# Patient Record
Sex: Male | Born: 1955
Health system: Southern US, Community
[De-identification: ages and names within clinical notes are randomized; demographics above are authoritative.]

## PROBLEM LIST (undated history)

## (undated) DIAGNOSIS — F329 Major depressive disorder, single episode, unspecified: Secondary | ICD-10-CM

## (undated) DIAGNOSIS — F1910 Other psychoactive substance abuse, uncomplicated: Secondary | ICD-10-CM

## (undated) DIAGNOSIS — I10 Essential (primary) hypertension: Secondary | ICD-10-CM

## (undated) DIAGNOSIS — M62830 Muscle spasm of back: Secondary | ICD-10-CM

## (undated) DIAGNOSIS — F1020 Alcohol dependence, uncomplicated: Secondary | ICD-10-CM

## (undated) DIAGNOSIS — Z86010 Personal history of colonic polyps: Secondary | ICD-10-CM

## (undated) DIAGNOSIS — D649 Anemia, unspecified: Secondary | ICD-10-CM

## (undated) DIAGNOSIS — C801 Malignant (primary) neoplasm, unspecified: Secondary | ICD-10-CM

## (undated) DIAGNOSIS — F3181 Bipolar II disorder: Secondary | ICD-10-CM

## (undated) DIAGNOSIS — E785 Hyperlipidemia, unspecified: Secondary | ICD-10-CM

## (undated) DIAGNOSIS — L719 Rosacea, unspecified: Secondary | ICD-10-CM

## (undated) DIAGNOSIS — F32A Depression, unspecified: Secondary | ICD-10-CM

## (undated) HISTORY — DX: Other psychoactive substance abuse, uncomplicated: F19.10

## (undated) HISTORY — PX: BASAL CELL CARCINOMA EXCISION: SHX1214

## (undated) HISTORY — DX: Major depressive disorder, single episode, unspecified: F32.9

## (undated) HISTORY — DX: Bipolar II disorder: F31.81

## (undated) HISTORY — PX: TONSILLECTOMY: SUR1361

## (undated) HISTORY — DX: Essential (primary) hypertension: I10

## (undated) HISTORY — PX: POLYPECTOMY: SHX149

## (undated) HISTORY — DX: Anemia, unspecified: D64.9

## (undated) HISTORY — DX: Rosacea, unspecified: L71.9

## (undated) HISTORY — DX: Malignant (primary) neoplasm, unspecified: C80.1

## (undated) HISTORY — DX: Personal history of colonic polyps: Z86.010

## (undated) HISTORY — DX: Depression, unspecified: F32.A

## (undated) HISTORY — PX: COLONOSCOPY W/ BIOPSIES: SHX1374

## (undated) HISTORY — DX: Alcohol dependence, uncomplicated: F10.20

## (undated) HISTORY — DX: Muscle spasm of back: M62.830

## (undated) HISTORY — DX: Hyperlipidemia, unspecified: E78.5

---

## 1993-09-03 HISTORY — PX: ACHILLES TENDON REPAIR: SUR1153

## 1998-02-23 ENCOUNTER — Other Ambulatory Visit: Admission: RE | Admit: 1998-02-23 | Discharge: 1998-02-23 | Payer: Self-pay | Admitting: Obstetrics and Gynecology

## 1999-09-04 HISTORY — PX: SHOULDER SURGERY: SHX246

## 2000-02-08 ENCOUNTER — Inpatient Hospital Stay (HOSPITAL_COMMUNITY): Admission: EM | Admit: 2000-02-08 | Discharge: 2000-02-11 | Payer: Self-pay | Admitting: Emergency Medicine

## 2000-02-08 ENCOUNTER — Encounter: Payer: Self-pay | Admitting: Orthopedic Surgery

## 2000-02-09 ENCOUNTER — Encounter: Payer: Self-pay | Admitting: Orthopedic Surgery

## 2000-09-27 ENCOUNTER — Observation Stay (HOSPITAL_COMMUNITY): Admission: RE | Admit: 2000-09-27 | Discharge: 2000-09-28 | Payer: Self-pay | Admitting: Specialist

## 2000-09-27 ENCOUNTER — Encounter: Payer: Self-pay | Admitting: Specialist

## 2002-06-03 ENCOUNTER — Encounter (INDEPENDENT_AMBULATORY_CARE_PROVIDER_SITE_OTHER): Payer: Self-pay | Admitting: Specialist

## 2002-06-03 ENCOUNTER — Ambulatory Visit (HOSPITAL_COMMUNITY): Admission: RE | Admit: 2002-06-03 | Discharge: 2002-06-03 | Payer: Self-pay | Admitting: *Deleted

## 2002-07-15 ENCOUNTER — Inpatient Hospital Stay (HOSPITAL_COMMUNITY): Admission: EM | Admit: 2002-07-15 | Discharge: 2002-07-19 | Payer: Self-pay | Admitting: Psychiatry

## 2003-04-05 ENCOUNTER — Emergency Department (HOSPITAL_COMMUNITY): Admission: EM | Admit: 2003-04-05 | Discharge: 2003-04-05 | Payer: Self-pay | Admitting: Emergency Medicine

## 2004-09-03 HISTORY — PX: MOHS SURGERY: SUR867

## 2004-11-20 ENCOUNTER — Ambulatory Visit: Payer: Self-pay | Admitting: Psychiatry

## 2004-11-20 ENCOUNTER — Other Ambulatory Visit (HOSPITAL_COMMUNITY): Admission: RE | Admit: 2004-11-20 | Discharge: 2004-12-21 | Payer: Self-pay | Admitting: Psychiatry

## 2008-03-06 ENCOUNTER — Inpatient Hospital Stay (HOSPITAL_COMMUNITY): Admission: EM | Admit: 2008-03-06 | Discharge: 2008-03-08 | Payer: Self-pay | Admitting: Emergency Medicine

## 2010-11-23 DIAGNOSIS — Z860100 Personal history of colon polyps, unspecified: Secondary | ICD-10-CM

## 2010-11-23 HISTORY — DX: Personal history of colon polyps, unspecified: Z86.0100

## 2010-11-23 HISTORY — DX: Personal history of colonic polyps: Z86.010

## 2011-01-16 NOTE — H&P (Signed)
NAME:  [name], [name] NO.:  [id number]   MEDICAL RECORD NO.:  [id number]          PATIENT TYPE:  EMS   LOCATION:  ED                           FACILITY:  [facility]   PHYSICIAN:  Dahlia Bailiff, MD    DATE OF BIRTH:  03-11-1956   DATE OF ADMISSION:  03/06/2008  DATE OF DISCHARGE:                              HISTORY & PHYSICAL   REASON FOR ADMISSION:  Acute low back pain.   HISTORY:  This is a 55 year old gentleman who is well known to Dr.  Tonita Cong.  He has had chronic low back pain for which Dr. Tonita Cong has treated  him nonoperatively.  The patient states that about 2 weeks ago, he had  another episode of acute onset of severe back pain with minimal to no  significant trauma.  He states that he only could get around by crawling  on his hands and knees.  He eventually got over this.  He was walking  down a flight of stairs last night early into this morning and just  stepped wrong and had a severe onset of sharp stabbing low back pain.  There is no migration of the pain.  It is in the lower extremities.  No  motor or sensory deficits.  He states the pain is quite severe and he is  unable to tolerate it.  As a result, he notified me since I was on call.  I suggested he present to the emergency room which he did.  At this  point in time, he states that he cannot get comfortable in bed, he has  severe low back pain.  No migration into the lower extremities.  He  states since he lives alone, he does not feel safe going home.   PAST MEDICAL HISTORY:  Chronic back pain.   SOCIAL HISTORY:  Nonsmoker.   ALLERGIES:  NO KNOWN DRUG ALLERGIES.   CURRENT MEDICATIONS:  1. Lamictal.  2. Minocycline.  3. Prednisone.  4. Seroquel.   REVIEW OF SYSTEMS:  A 14-point review of systems, no significant  abnormalities.  No history of lung, cardiac or GI issues.   PHYSICAL EXAMINATION:  GENERAL:  He is alert and oriented x3.  He has  difficulty just moving very slight distances in  bed.  He is  neurologically intact.  EXTREMITIES:  No focal motor or sensory deficits in the lower extremity.  Negative nerve root tension sign.  Symmetrical deep tendon reflexes.  Negative clonus.  Intact peripheral pulses.  BACK:  Horrific low back pain with even minimal palpation.  Positive  reproduction of pain with axial pressure applied to the head.  CHEST:  He has got no shortness of breath or chest pain.  ABDOMEN:  Soft and nontender.   At this point in time, the patient states that he is unable to tolerate  the pain.  We will admit him for social reasons and pain control.  I  will admit him for my partner, Dr. Tonita Cong who has been caring cor the  patient on an outpatient basis.  Will control pain with oral medications  and muscle  relaxants.  Re-evaluate in the morning for possible discharge  to home.      Dahlia Bailiff, MD  Electronically Signed     DDB/MEDQ  D:  03/06/2008  T:  03/06/2008  Job:  [id number]   cc:   Dahlia Bailiff, MD  Fax: [phone]

## 2011-01-19 NOTE — H&P (Signed)
NAME:  [name], FOILES NO.:  [id number]   MEDICAL RECORD NO.:  [id number]                   PATIENT TYPE:  IPS   LOCATION:  5409                                 [facility]ACILITY[facility]  [facility]   PHYSICIAN:  Aldona Bar, M.D.               DATE OF BIRTH:  [birth year]   DATE OF ADMISSION:  07/15/2002  DATE OF DISCHARGE:                         PSYCHIATRIC ADMISSION ASSESSMENT   IDENTIFYING INFORMATION:  This is a 55 year old married white male  voluntarily admitted on July 15, 2002.   HISTORY OF PRESENT ILLNESS:  The patient presents with a history of alcohol  abuse, drinking about 18 drinks per day.  His drinking has been escalating  over the past few days.  Prior to this admission, he has been having  significant stressors.  His sister-in-law has passed away.  He has a history  of depression with fleeting suicidal thoughts to jump off a building.  His  sleep has been satisfactory.  His appetite has been satisfactory.  He denies  any psychotic symptoms.  Denies any withdrawal symptoms and states he is  motivated to stop drinking.   PAST PSYCHIATRIC HISTORY:  First hospitalization to [facility].  Was at [facility] in February of 2003 for alcohol abuse.  Sees  a therapist, Juliann Pulse __________, at [facility] and sees Dr. [name].  No  history of a suicide attempt.   SOCIAL HISTORY:  This is a 55 year old married white male, married for eight  years, first marriage, no children.  He used to work for college sports.  He  is not working at this time.  No legal problems.  He states he has a  supportive wife.   FAMILY HISTORY:  None.   ALCOHOL/DRUG HISTORY:  He denies any substance abuse.  Drinking has been  escalating over this past weekend.  He states his last drink was on Tuesday.  He has a history of blackouts with no seizures.  He does state that he does  not drink in the morning or at work.  He has been attending [place] meetings.   PRIMARY  CARE PHYSICIAN:  Unknown.   MEDICAL PROBLEMS:  Herniated disk.  States no chronic illnesses.   MEDICATIONS:  Has been on Lexapro since March of 2003 for depression.  Has  been on Effexor and Wellbutrin in the past and minocycline for his rash.   ALLERGIES:  No known allergies.   PHYSICAL EXAMINATION:  VITAL SIGNS:  Temperature 98.4, heart rate 94,  respirations 22, blood pressure 112/70.  He is 6 feet 2 inches tall.  He is  210 pounds.  GENERAL APPEARANCE:  The patient is a 55 year old Caucasian male, average  stature, appears his stated age.  Well-built, well-nourished.  He is well-  groomed, alert and cooperative.  HEENT:  Head is normocephalic, atraumatic.  He can raise his eyebrows.  His  hair is neat and evenly distributed.  EOMs are  intact.  Funduscopic exam is  within normal limits.  External ear canals are patent.  There is some  cerumen noted.  Nostrils are patent bilaterally.  No sinus tenderness.  Mucosa is moist with good dentition.  No lesions were seen.  His tongue  protrudes midline without tremor.  He can clench his teeth and puff out his  cheeks.  Uvula is midline.  No pharyngeal exudate.  NECK:  Supple with full range of motion.  Negative lymphadenopathy.  Trachea  is midline.  Thyroid is nonpalpable, nontender.  CHEST:  Clear to auscultation.  No adventitious sounds.  Thorax is  symmetrical with good expansion.  HEART:  Regular rate and rhythm without murmurs, gallops or rubs.  Carotid  pulses are equal and adequate.  No carotid bruits were auscultated.  No  edema was noted.  ABDOMEN:  Soft, nontender abdomen.  Active bowel sounds are present.  No CVA  tenderness.  MUSCULOSKELETAL:  No joint swelling or deformity.  Muscle strength and tone  is equal bilaterally.  No signs of injury.  The patient has a past history  of right AC joint surgery.  SKIN:  Warm and dry with good turgor.  Nail beds are pink with good  capillary refill.  The patient has acne vulgaris rash  noted.  NEUROLOGIC:  Oriented x 3.  Cranial nerves are grossly intact.  Deep tendon  reflexes are 2+ equal and adequate.  Good grip strength bilaterally.  No  involuntary movements.  Cerebellar function is intact.   LABORATORY DATA:  Glucose is elevated at 159, bilirubin mildly elevated at  1.3.   MENTAL STATUS EXAM:  He is an alert, oriented, nicely dressed, healthy male.  Cooperative with good eye contact.  Speech is expansive.  Mood is  appropriate.  Thought processes are coherent.  There is no evidence of  psychosis.  He does not appear to be responding to internal stimuli.  Cognitive function intact.  Memory is good.  Judgment is fair.  Insight is  poor.   DIAGNOSES:   AXIS I:  1. Depression not otherwise specified.  2. Alcohol dependence.   AXIS II:  Deferred.   AXIS III:  Herniated disk.   AXIS IV:  Problems with primary support group and other psychosocial  problems.   AXIS V:  Current 40; estimated this past year 64.   PLAN:  Voluntary admission for alcohol dependence and depression.  Contract  for safety.  Check every 15 minutes.  Will initiate the low dose Librium  protocol to detox safely.  Will resume his Lexapro.  Will encourage fluids.  Will increase patient's coping skills by attending group therapy and  continue with his outpatient therapy.  The patient to remain alcohol-free  and attend AA meetings.   TENTATIVE LENGTH OF STAY:  Three to four days.     Redgie Grayer, N.P.                       Aldona Bar, M.D.    JO/MEDQ  D:  07/17/2002  T:  07/18/2002  Job:  [id number]

## 2011-01-19 NOTE — Op Note (Signed)
[facility]  Patient:    [name], [name]                     MRN: [id number] Proc. Date: 09/27/00 Adm. Date:  [id number] Disc. Date: [id number] Attending:  Cynda Familia                           Operative Report  PREOPERATIVE DIAGNOSIS:  Right shoulder type 5 acromioclavicular joint injury.  POSTOPERATIVE DIAGNOSIS:  Right shoulder type 5 acromioclavicular joint injury.  PROCEDURE:  Right shoulder AC joint reduction and repair with internal fixation.  SURGEON:  R. Hart Robinsons, M.D.  ASSISTANT:  Peter Congo, P.A.-C.  ANESTHESIA:  General.  ESTIMATED BLOOD LOSS:  Less than 50 cc.  DRAINS:  None.  COMPLICATIONS:  None.  DISPOSITION:  To PACU stable.  OPERATIVE DETAILS:  The patient was counseled in the holding area and correct side was identified.  IV started and antibiotics were given.  Interscalene block was attempted by Dr. Thereasa Parkin, but unsuccessful.  He was taken to the operating room, placed in a modified beach chair position.  The right shoulder examined.  Some degree of stiffness already developing, but a range of motion was full.  He had a marked AC joint deformity consistent with a type 5 injury.  Prepped with Duraprep and the arm draped in sterile fashion. Overlying skin incision was outlined with a marking pen and anesthetized with 0.5% Marcaine with epinephrine.  A longitudinal incision was made between the lateral third of the clavicle to the corner of the [facility] joint.  Dissected carried through the skin and subcutaneous tissue.  The deltoid was found to be partially avulsed.  A large hematoma was encountered.  The deltotrapezius fascia was also traumatically torn. At this time the skin flaps were developed and the interval over the [facility] joint with the deltoid was then mobilized, leaving a cuff of tissue for later repair.  The CA ligament was identified, dissected and then released off the acromion for transfer.   The coracoclavicular ligaments, conoid and trapezoid ligaments were found, and actually they had a nice stump of tissue on the coracoid and undersurface of the acromion.  A fiber wire suture was placed through this for repair.  The joint was inspected.  The fibrocartilage had been torn and it was debrided. The wound was irrigated and hemostasis was obtained.  On looking at the superior surface of the rotator cuff, I could see no gross abnormality, but the view was limited secondary to the visualization.  The wounds were irrigated.  At this point and time I reduced the [facility] joint and placed a guide pin across the acromion for a 4.5 mm AO cannulated screw.  Using a C-arm, confirmed satisfactory reduction and placement of the pin; then a screw was placed.  A plain x-ray confirmed excellent reduction of the [facility] joint and placement of implants. The fiber wire was then utilized and sutured down the coracoclavicular ligaments.  Then circumferential suture was then placed to the stump of the ligaments and around the clavicle for later repair with fiber wire.  The coracoclavicular ligament was then transferred to the distal edge of the clavicle, through the drill holes in the clavicle; suturing this to the undersurface.  At this point and time a meticulous closure was done of the deltoid and back to the leading edge of the acromion, and also to the clavicle where it had been  avulsed; sutured to the periosteum in the fascia and the deltotrapezial fascia was also closed with Vicryl suture.  This gave a nice tidy repair.  The wound was irrigated.  ____________ closure.  Subcutaneous closed with Vicryl, skin closed with subcuticular Monocryl suture.  Another 15 cc of 0.5% Marcaine with epinephrine was placed in the wound edges.  Benzoin with Steri-Strips were applied.  Sterile dressing was applied.  He tolerated the procedure well and there were no complications.  Sponge and needle counts were correct.   He was awakened and extubated, taken from the operating room to PACU in stable condition in a shoulder immobilizer. DD:  09/27/00 TD:  09/28/00 Job: [id number]

## 2011-01-19 NOTE — Discharge Summary (Signed)
[facility]  Patient:    [name], [name]                     MRN: [id number] Adm. Date:  [id number] Disc. Date: [id number] Attending:  Laddie Aquas Dictator:   [employer], P.A.                           Discharge Summary  ADMITTING DIAGNOSIS:  Severe lumbosacral strain.  DISCHARGE DIAGNOSIS:  Severe lumbosacral strain, improving.  PROCEDURES:  None.  CONSULTS:  None.  BRIEF HISTORY:  This is a 55 year old, well-known to Dr. Collie Siad.  He had acute onset of low back pain and bilateral leg pain.  He has had two previous admissions to [facility] for the same problem during the past four years.  To date, x-rays and MRIs have been negative.  However, patient has improved on both episodes with conservative treatment.  He will be admitted to [facility] at this time for MRI, bed rest and traction and analgesics.  HOSPITAL COURSE:  On February 08, 2000, the patient was admitted to [facility].  He was placed in traction and put on strict bed rest.  Prednisone dose pack was initiated, as well as Demerol.  MRI revealed a herniated nucleus pulposus at L1-L2 on the central and right side, herniated nucleus pulposus at L3-L4 on the central and left side and herniated nucleus pulposus at L4-L5 central.  On hospital day 3, the patient was feeling "a lot better".  On exam, he was afebrile and his vital signs were stable.  He was neurovascularly intact to bilateral lower extremities.  He was moving around the bed quite easily.  His abdomen was soft and nontender.  It was felt the patient was stable for discharge to home at this time after evaluation with physical therapy for ambulation and transfers.  LABS: 1.   CBC was within normal limits. 2.   Routine chemistry was within normal limits.  RADIOLOGY: 1.   X-rays of the lumbar spine showed degenerative disc disease at L4-L5.      MRI of the spine again showed moderate-sized disc  extrusion L1-L2 central      to the right with right-sided L2 nerve root encroachment. 2.   Subligamentus protrusion at L3-L4 central to the left with L4 nerve      root encroachment. 3.   Central subligamentus protrusion L4-L5 with neural encroachment. 4.   Lumbar facet arthropathy at L5-S1.  CONDITION ON DISCHARGE:  Improved and stable.  DISCHARGE PLANS:  Patient will be discharged to home.  He is to be up ad lib, weight-bearing as tolerated.  He is not to do any bending  heavy lifting.  He is to continue taking his Medrol dose pack as directed.  He was given [employer] #50 for pain.  Valium 5 mg #30 for spasms.  Following his Medrol dose pack, he is to continue taking Vioxx 25 mg daily.  He is to resume home diet and home medications.  The patient understands that at this time, we are going to treat his condition conservatively.  He is to return to clinic in one week to follow up with Dr. Collie Siad.  He is to call [phone] to make an appointment. DD:  03/08/00 TD:  03/08/00 Job: [id number]

## 2011-01-19 NOTE — H&P (Signed)
[facility]  Patient:    [name], [name]                     MRN: [id number] Adm. Date:  [id number] Attending:  Cynda Familia Dictator:   Peter Congo, P.A. CC:         Zella Richer. Burnett Harry, M.D.   History and Physical  CHIEF COMPLAINT:  Right shoulder pain.  HISTORY OF PRESENT ILLNESS:  [name] is a pleasant 55 year old male with a history of chronic shoulder problems.  Approximately one and a half to two weeks ago, he did fall while skiing.  He immediately had an x-ray in the emergency department in [place], [place], which revealed a right [facility] joint separation.  He was seen by R. Hart Robinsons, M.D., in the office, which confirmed an [facility] joint separation on x-ray and showed that he had an obvious deformity.  At that point, he did have a type 5 AC joint separation and felt a necessity for him to undergo repair on his [facility] joint.  PAST MEDICAL HISTORY:  Significant for right AC joint separation as described above.  Also, history of HNP at L3 through L5.  No other medical problems.  PAST SURGICAL HISTORY:  Achilles tendon repair in 1994.  Wisdom teeth, unsure of the year of this.  A tonsillectomy age 61 years old.  SOCIAL HISTORY:  The patient is married.  He lives at home with his family. He uses alcohol rarely and is a nonsmoker.  ALLERGIES:  No known drug allergies.  MEDICATIONS:  Minocycline p.r.n. acne and ibuprofen and Percocet p.r.n. pain.  FAMILY HISTORY:  Noncontributory.  REVIEW OF SYSTEMS:  General:  No fevers, chills, night sweats, or bleeding tendencies.  CNS:  No blurred or double vision.  No seizures, headache, or paralysis.  Respiratory:  No shortness of breath, productive cough, or hemoptysis.  Cardiovascular:  No chest pain, angina, or orthopnea.  GI:  No nausea, vomiting, diarrhea, constipation, or melena.  GU:  No dysuria, hematuria, or discharge.  Musculoskeletal:  Right AC joint as described above. No history of  arthritis or joint erythema.  PHYSICAL EXAMINATION:  GENERAL APPEARANCE:  A pleasant, white, 55 year old male.  Excellent historian.  VITAL SIGNS:  Temperature 97.7 degrees, pulse 69, respirations 20, blood pressure 144/80.  NECK:  Supple.  Negative for carotid bruits.  LUNGS:  Clear to auscultation bilaterally.  No rhonchi, rales, or wheezes.  BREASTS:  Not pertinent to present illness.  GENITOURINARY:  Not pertinent to present illness.  HEART:  S1 and S2.  No murmurs, rubs, or gallops.  The heart is regular in rate and rhythm.  ABDOMEN:  Soft and nontender.  Positive bowel sounds.  EXTREMITIES:  2+ radial pulse bilaterally.  The patient has decreased movement to the right upper extremity.  There is an obvious deformity about the clavicular and acromial area.  Pain on palpation of the [facility] joint.  SKIN:  Clear.  The patient does have some mild acne about the right shoulder and on the face.  NEUROLOGIC:  EOMs intact.  PERRLA.  LABORATORY DATA AND X-RAYS:  X-rays on September 25, 2000, revealed a right [facility] joint separation, type 5.  IMPRESSION: 1. Right shoulder acromioclavicular joint separation, type 5. 2. History of herniated nucleus pulposus at L3 through L5.  PLAN:  The patient is scheduled for a right AC joint repair on September 27, 2000, by R. Hart Robinsons, M.D., at [facility]. DD:  09/27/00  TD:  09/27/00 Job: [id number]

## 2011-01-19 NOTE — Discharge Summary (Signed)
NAME:  [name], [name] NO.:  [id number]   MEDICAL RECORD NO.:  [id number]                   PATIENT TYPE:  IPS   LOCATION:  7253                                 [facility]ACILITY:  [facility]   PHYSICIAN:  Aldona Bar, M.D.               DATE OF BIRTH:  [birth year]   DATE OF ADMISSION:  07/15/2002  DATE OF DISCHARGE:  07/19/2002                                 DISCHARGE SUMMARY   PATIENT IDENTIFICATION:  The patient is a 55 year old married white male who  was admitted on involuntary papers.  He presented with a history of alcohol  abuse, drinking about 18 drinks per day and reporting that his drinking had  been escalating for the past few weeks.  Prior to admission, he had been  having significant stressors.  His sister-in-law had passed away.  The  patient had a history of depression with fleeting suicidal thoughts,  thoughts of jumping off a building.  He never attempted suicide.  The  patient was under the care of a psychiatrist, Denna Haggard, M.D.  In  February 2003, he was at [facility] for alcohol detoxification.  Since  March 2003, he had been treated with Lexapro for depression.  Medically, he  suffers from herniated disk; otherwise no medical problems.   PHYSICAL EXAMINATION ON ADMISSION:  GENERAL: Physical examination was  essentially normal.  VITAL SIGNS: Blood pressure 112/70, respiratory rate  22, heart rate 94, temperature 98.4.  The patient did not seem to be in any  physical distress.   DIAGNOSTIC IMPRESSION:   AXIS I:  1. Depressive disorder, not otherwise specified.  2. Alcohol dependence.   HOSPITAL COURSE:  After admission to the ward, the patient was placed on  Librium detoxification protocol.  He was started on Lexapro 20 mg daily and  trazodone 50 mg at bedtime p.r.n. insomnia.   On November 14, he reported doing better.  He denied any dangerous  ideations, no signs of withdrawal.  He had presented with a lot of denial  about his relapse and playing down the seriousness of his addiction.  On  November 15, he kept continuously improving; no suicidal ideation.  On  November 16, there were no signs of withdrawal.  There were isolated  laboratory abnormalities with slight elevation of bilirubin and elevation of  nonfasting blood sugar.  Affect was normal.  There were no signs of  withdrawal.   LABORATORY DATA:  Review of laboratory work showed normal CBC with slight  elevation of white blood count 11.5.  Elevated nonfasting blood sugar 159.  Isolated increase of total bilirubin at 1.3 with normal up to 1.2.  Slight  elevation of T3 uptake 39.2 with normal up to 37.  TSH and T4 were normal.  Urine drug screen was positive for benzodiazepines; otherwise, negative for  substances of abuse.  Urinalysis was normal.   PHYSICAL EXAMINATION:  VITAL SIGNS: Vital signs  throughout the  hospitalization were stable with blood pressure 120/68, normal temperature,  respiratory rate, and pulse.  The patient was 6 feet 2 inches tall and his  weight was 200 pounds.   It was felt that the patient had accomplished detoxification.  Since he was  agreeable for outpatient treatment, I felt it would be safe to discharge him  home.    DISCHARGE DIAGNOSES:   AXIS I:  1. Depressive disorder, not otherwise specified.  2. Alcohol dependence.   AXIS II:  Diagnosis deferred.   AXIS III:  Herniated disk.   AXIS IV:  Problems with primary support group.   AXIS V:  Global assessment of functioning upon admission was 15, for past  year maximum 70, upon discharge 60-65.   DISCHARGE MEDICATIONS:  Lexapro 20 mg daily and he had a supply of this  medication.   DISCHARGE INSTRUCTIONS:  He was supposed to attend AA groups upon discharge  and attend [facility] IOP at 5:30 p.m. on July 20, 2002.  Prior to discharge,  the patient was informed about abnormalities of his blood work and advised  to see his physician for followup.  Likely,  most of the abnormalities were  related to alcohol use.  The patient understood instructions and in good  condition was discharged home in the care of his family.                                               Aldona Bar, M.D.    JS/MEDQ  D:  08/18/2002  T:  08/19/2002  Job:  [id number]

## 2011-02-12 ENCOUNTER — Telehealth: Payer: Self-pay | Admitting: Internal Medicine

## 2011-02-12 NOTE — Telephone Encounter (Signed)
Talked with the patient and he stated he was a patient of Dr. Kathline Magic at [facility] but his insurance is changing and [facility] doesn't accept his insurance. Patient stated he would like to become a patient of your if you will accept him. He is having his records sent for your review.

## 2011-02-14 NOTE — Telephone Encounter (Signed)
[place] - waiting on records

## 2011-02-15 ENCOUNTER — Telehealth: Payer: Self-pay | Admitting: Internal Medicine

## 2011-02-15 NOTE — Telephone Encounter (Signed)
Records received and put on (your) Dr. Celesta Aver desk for review.

## 2011-02-15 NOTE — Telephone Encounter (Signed)
Forwarded to Dr. Carlean Purl for review.

## 2011-02-21 NOTE — Telephone Encounter (Signed)
Information given to scheduler to make appointment.

## 2011-02-21 NOTE — Telephone Encounter (Signed)
We can schedule him - next available

## 2011-02-27 ENCOUNTER — Encounter: Payer: Self-pay | Admitting: Internal Medicine

## 2011-03-09 ENCOUNTER — Telehealth: Payer: Self-pay | Admitting: Internal Medicine

## 2011-03-09 NOTE — Telephone Encounter (Signed)
Patient has no current problems.  He just wanted to get his records over here and accepted in case a GI is needed.  He will call back to schedule an office visit when he is ready. He has asked me to cancel the office visit.

## 2011-04-03 ENCOUNTER — Telehealth: Payer: Self-pay | Admitting: Internal Medicine

## 2011-04-03 NOTE — Telephone Encounter (Signed)
Forwarded to Dr. Carlean Purl For Review.

## 2011-04-11 ENCOUNTER — Ambulatory Visit: Payer: Self-pay | Admitting: Internal Medicine

## 2011-04-26 ENCOUNTER — Ambulatory Visit (INDEPENDENT_AMBULATORY_CARE_PROVIDER_SITE_OTHER): Payer: Managed Care, Other (non HMO) | Admitting: Internal Medicine

## 2011-04-26 ENCOUNTER — Encounter: Payer: Self-pay | Admitting: Internal Medicine

## 2011-04-26 VITALS — BP 122/78 | HR 78 | Ht 74.0 in | Wt 221.0 lb

## 2011-04-26 DIAGNOSIS — F1020 Alcohol dependence, uncomplicated: Secondary | ICD-10-CM

## 2011-04-26 DIAGNOSIS — K5100 Ulcerative (chronic) pancolitis without complications: Secondary | ICD-10-CM | POA: Insufficient documentation

## 2011-04-26 DIAGNOSIS — F3189 Other bipolar disorder: Secondary | ICD-10-CM

## 2011-04-26 DIAGNOSIS — Z86010 Personal history of colonic polyps: Secondary | ICD-10-CM

## 2011-04-26 DIAGNOSIS — F3181 Bipolar II disorder: Secondary | ICD-10-CM

## 2011-04-26 MED ORDER — MESALAMINE 1.2 G PO TBEC: DELAYED_RELEASE_TABLET | ORAL | Status: DC

## 2011-04-26 NOTE — Assessment & Plan Note (Signed)
Given this adenomatous polyp in the setting of ulcerative colitis repeat colonoscopy in the year, March 2013. This could be indicative of a dysplasia as opposed to an isolated adenoma the latter is more likely like to be more certain.

## 2011-04-26 NOTE — Progress Notes (Signed)
  Subjective:    Patient ID: [name], male    DOB: 04-15-1956, 55 y.o.   MRN: [id number]  HPI 55 year old white man diagnosed with ulcerative colitis by Dr. Michail Sermon. That was in 2003, and he had proctitis. Eventually had progressive disease and came to have universal ulcerative colitis. Over time he has done pretty well, he has had some problems when he has not taken his Lialda properly but lately he feels like he has been taking that at 2.4 g daily and doing well. He had some inflammatory changes in his recent colonoscopy in 2011 as well as a small adenoma removed then. Since that time, he has been compliant with his medication as described. Moving to state high-risk health plan so he needed to obtain a new physician covered under this plan. His GI review of systems is otherwise negative. He has no diarrhea or bleeding lately. No abdominal pain.  He is a divorced white man, works as a Forensic scientist. No children. Believe he lives alone. He is a recovering alcoholic and also has bipolar disorder. He does admit that he has had trouble with relapses over time, estimating he is about 98% sober. Review of Systems Recent skin cancer removals.    Objective:   Physical Exam General: Well-developed, well-nourished and in no acute distress Vitals: Reviewed and listed above Eyes:anicteric. Mouth and posterior pharynx: normal.  Neck: supple w/o thyromegaly or mass.  Lungs: clear. Heart: S1S2, no rubs, murmurs, gallops. Abdomen: soft, non-tender, no hepatosplenomegaly, hernia, or mass and BS+.  Rectal: inspected - small tag otherwise no perianal changes Lymphatics: no cervical, [place] or inguinal nodes. Extremities:  no edema Skin multiple ulcers from lesion treatment/removal on forearms Neuro: nonfocal. A&O x 3.  Psych: appropriate mood and  affect.           Assessment & Plan:

## 2011-04-26 NOTE — Assessment & Plan Note (Signed)
Continue current therapy with Lialda 2.4 g daily  Repeat routine colonoscopy (WITH PROPOFOL) for high-risk screening in 11/2010 or close to that given adenoma in setting of the UC

## 2011-04-26 NOTE — Patient Instructions (Signed)
Your prescription(s) has(have) been sent to your pharmacy for you to pick up (Lialda). Your Colon recall has been st for March 2013, you will here from [place] at that time.

## 2011-05-29 ENCOUNTER — Telehealth: Payer: Self-pay | Admitting: Internal Medicine

## 2011-05-29 NOTE — Telephone Encounter (Signed)
Received copies from [facility] [place],on 05/29/2011. Forwarded  26pages to Dr. Wilhelmenia Blase review.

## 2011-05-31 LAB — COMPREHENSIVE METABOLIC PANEL
BUN: 15
CO2: 33 — ABNORMAL HIGH
Calcium: 9.4
Creatinine, Ser: 0.91
Glucose, Bld: 107 — ABNORMAL HIGH
Total Protein: 6.4

## 2011-05-31 LAB — CBC
HCT: 43.7
Hemoglobin: 14.9
MCHC: 34.1
MCV: 92
RBC: 4.75
RDW: 12.7

## 2011-05-31 LAB — APTT: aPTT: 34

## 2011-06-19 ENCOUNTER — Encounter: Payer: Self-pay | Admitting: Internal Medicine

## 2011-06-19 ENCOUNTER — Ambulatory Visit (INDEPENDENT_AMBULATORY_CARE_PROVIDER_SITE_OTHER): Payer: PRIVATE HEALTH INSURANCE | Admitting: Internal Medicine

## 2011-06-19 VITALS — BP 138/70 | HR 62 | Temp 97.8°F | Resp 16 | Wt 224.0 lb

## 2011-06-19 DIAGNOSIS — E785 Hyperlipidemia, unspecified: Secondary | ICD-10-CM | POA: Insufficient documentation

## 2011-06-19 DIAGNOSIS — E7800 Pure hypercholesterolemia, unspecified: Secondary | ICD-10-CM

## 2011-06-19 MED ORDER — ROSUVASTATIN CALCIUM 20 MG PO TABS: 20.0000 mg | ORAL_TABLET | Freq: Every day | ORAL | Status: DC

## 2011-06-19 NOTE — Patient Instructions (Signed)
Hypercholesterolemia High Blood Cholesterol Cholesterol is a white, waxy, fat-like protein needed by your body in small amounts. The liver makes all the cholesterol you need. It is carried from the liver by the blood through the blood vessels. Deposits (plaque) may build up on blood vessel walls. This makes the arteries narrower and stiffer. Plaque increases the risk for heart attack and stroke. You cannot feel your cholesterol level even if it is very high. The only way to know is by a blood test to check your lipid (fats) levels. Once you know your cholesterol levels, you should keep a record of the test results. Work with your caregiver to to keep your levels in the desired range. WHAT THE RESULTS MEAN:  Total cholesterol is a rough measure of all the cholesterol in your blood.   LDL is the so-called bad cholesterol. This is the type that deposits cholesterol in the walls of the arteries. You want this level to be low.   HDL is the good cholesterol because it cleans the arteries and carries the LDL away. You want this level to be high.   Triglycerides are fat that the body can either burn for energy or store. High levels are closely linked to heart disease.  DESIRED LEVELS:  Total cholesterol below 200.   LDL below 100 for people at risk, below 70 for very high risk.   HDL above 50 is good, above 60 is best.   Triglycerides below 150.  HOW TO LOWER YOUR CHOLESTEROL:  Diet.   Choose fish or white meat chicken and [place], roasted or baked. Limit fatty cuts of red meat, fried foods, and processed meats, such as sausage and lunch meat.   Eat lots of fresh fruits and vegetables. Choose whole grains, beans, pasta, potatoes and cereals.   Use only small amounts of olive, corn or canola oils. Avoid butter, mayonnaise, shortening or palm kernel oils. Avoid foods with trans-fats.   Use skim/nonfat milk and low-fat/nonfat yogurt and cheeses. Avoid whole milk, cream, ice cream, egg yolks and  cheeses. Healthy desserts include angel food cake, gingersnaps, animal crackers, hard candy, popsicles, and low-fat/nonfat frozen yogurt. Avoid pastries, cakes, pies and cookies.   Exercise.   A regular program helps decrease LDL and raises HDL.   Helps with weight control.   Do things that increase your activity level like gardening, walking, or taking the stairs.   Medication.   May be prescribed by your caregiver to help lowering cholesterol and the risk for heart disease.   You may need medicine even if your levels are normal if you have several risk factors.  HOME CARE INSTRUCTIONS  Follow your diet and exercise programs as suggested by your caregiver.   Take medications as directed.   Have blood work done when your caregiver feels it is necessary.  MAKE SURE YOU:   Understand these instructions.   Will watch your condition.   Will get help right away if you are not doing well or get worse.  Document Released: 08/20/2005 Document Re-Released: 08/02/2008 [facility] Patient Information 2011 [place].

## 2011-06-20 ENCOUNTER — Encounter: Payer: Self-pay | Admitting: Internal Medicine

## 2011-06-20 LAB — CBC WITH DIFFERENTIAL/PLATELET
Cholesterol: 265 mg/dL — AB (ref 0–200)
HCT: 42 %
HGB: 14.4 g/dL
MCHC: 34.5
MCV: 92.2 fL (ref 80–98)

## 2011-06-21 ENCOUNTER — Encounter: Payer: Self-pay | Admitting: Internal Medicine

## 2011-06-21 LAB — COMPREHENSIVE METABOLIC PANEL
Anion Gap: 9 mmol/L (ref ?–30)
CO2: 29 mmol/L
Calcium: 9.8 mg/dL
Chloride: 102 mmol/L
Potassium: 4.5 mmol/L

## 2011-06-21 LAB — GLUCOSE, FASTING: Glucose: 94

## 2011-06-21 LAB — SEDIMENTATION RATE: Sed Rate: 10

## 2011-06-21 NOTE — Progress Notes (Signed)
  Subjective:    Patient ID: [name], male    DOB: 1956/06/10, 55 y.o.   MRN: [id number]  Hyperlipidemia This is a chronic problem. The current episode started more than 1 year ago. The problem is uncontrolled. Recent lipid tests were reviewed and are variable. He has no history of chronic renal disease, diabetes, hypothyroidism, liver disease, obesity or nephrotic syndrome. Factors aggravating his hyperlipidemia include no known factors. Pertinent negatives include no chest pain, focal sensory loss, focal weakness, leg pain, myalgias or shortness of breath. He is currently on no antihyperlipidemic treatment.      Review of Systems  Constitutional: Negative.   HENT: Negative.   Eyes: Negative.   Respiratory: Negative for apnea, cough, choking, chest tightness, shortness of breath, wheezing and stridor.   Cardiovascular: Negative for chest pain, palpitations and leg swelling.  Gastrointestinal: Negative for nausea, vomiting, abdominal pain, diarrhea, constipation, blood in stool, abdominal distention, anal bleeding and rectal pain.  Genitourinary: Negative for dysuria, urgency, frequency, hematuria, flank pain, decreased urine volume, enuresis and difficulty urinating.  Musculoskeletal: Negative for myalgias, back pain, joint swelling, arthralgias and gait problem.  Skin: Negative for color change, pallor, rash and wound.  Neurological: Negative.  Negative for focal weakness.  Hematological: Negative for adenopathy. Does not bruise/bleed easily.  Psychiatric/Behavioral: Negative.        Objective:   Physical Exam  Vitals reviewed. Constitutional: He is oriented to person, place, and time. He appears well-developed and well-nourished. No distress.  HENT:  Head: Normocephalic and atraumatic.  Mouth/Throat: Oropharynx is clear and moist. No oropharyngeal exudate.  Eyes: Conjunctivae are normal. Right eye exhibits no discharge. Left eye exhibits no discharge. No scleral icterus.    Neck: Normal range of motion. Neck supple. No JVD present. No tracheal deviation present. No thyromegaly present.  Cardiovascular: Normal rate, regular rhythm, normal heart sounds and intact distal pulses.  Exam reveals no gallop and no friction rub.   No murmur heard. Pulmonary/Chest: Effort normal and breath sounds normal. No stridor. No respiratory distress. He has no wheezes. He has no rales. He exhibits no tenderness.  Abdominal: Soft. Bowel sounds are normal. He exhibits no distension and no mass. There is no tenderness. There is no rebound and no guarding.  Musculoskeletal: Normal range of motion. He exhibits no edema and no tenderness.  Lymphadenopathy:    He has no cervical adenopathy.  Neurological: He is oriented to person, place, and time. He displays normal reflexes. He exhibits normal muscle tone. Coordination normal.  Skin: Skin is warm and dry. No rash noted. He is not diaphoretic. No erythema. No pallor.  Psychiatric: He has a normal mood and affect. His behavior is normal. Judgment and thought content normal.          Lab Results  Component Value Date   WBC 6.0 01/09/2011   HGB 14.4 01/09/2011   HCT 42 01/09/2011   PLT 188 03/08/2008   GLUCOSE 107* 03/08/2008   CHOL 265* 01/09/2011   TRIG 155 01/09/2011   HDL 212* 01/09/2011   LDLDIRECT 166 01/09/2011   ALT 21 03/08/2008   AST 21 03/08/2008   NA 138 03/08/2008   K 4.3 03/08/2008   CL 101 03/08/2008   CREATININE 0.91 03/08/2008   BUN 15 03/08/2008   CO2 33* 03/08/2008   INR 0.9 03/08/2008   Assessment & Plan:

## 2011-06-21 NOTE — Assessment & Plan Note (Signed)
Start crestor

## 2011-06-27 ENCOUNTER — Telehealth: Payer: Self-pay

## 2011-06-27 DIAGNOSIS — E7800 Pure hypercholesterolemia, unspecified: Secondary | ICD-10-CM

## 2011-06-27 MED ORDER — ATORVASTATIN CALCIUM 80 MG PO TABS: 80.0000 mg | ORAL_TABLET | Freq: Every day | ORAL | Status: DC

## 2011-06-27 NOTE — Telephone Encounter (Signed)
Patient called [facility] stating insurance will not cover crestor. Alternatives are pravachol, zocor, or lipitor, Please advise

## 2011-06-27 NOTE — Telephone Encounter (Signed)
Patient notified

## 2011-06-27 NOTE — Telephone Encounter (Signed)
done

## 2011-08-15 ENCOUNTER — Telehealth: Payer: Self-pay

## 2011-08-15 DIAGNOSIS — E785 Hyperlipidemia, unspecified: Secondary | ICD-10-CM

## 2011-08-15 NOTE — Telephone Encounter (Signed)
Patient called [name] stating that he was seen 06/19/11 and started on cholesterol medication. He was advised to have rechecked with appt set for 08/21/11. Per pt here would rather just stop by lab and have this done vs MD appt. He states that if after results, then he would follow up if needed. Please advise if ok

## 2011-08-16 NOTE — Telephone Encounter (Signed)
ok 

## 2011-08-17 NOTE — Telephone Encounter (Signed)
Patient notified// order placed in Epic and MD appt CX

## 2011-08-21 ENCOUNTER — Ambulatory Visit: Payer: PRIVATE HEALTH INSURANCE | Admitting: Internal Medicine

## 2011-08-21 ENCOUNTER — Other Ambulatory Visit (INDEPENDENT_AMBULATORY_CARE_PROVIDER_SITE_OTHER): Payer: PRIVATE HEALTH INSURANCE

## 2011-08-21 DIAGNOSIS — E785 Hyperlipidemia, unspecified: Secondary | ICD-10-CM

## 2011-08-21 LAB — LIPID PANEL
Cholesterol: 132 mg/dL (ref 0–200)
LDL Cholesterol: 54 mg/dL (ref 0–99)
Triglycerides: 152 mg/dL — ABNORMAL HIGH (ref 0.0–149.0)
VLDL: 30.4 mg/dL (ref 0.0–40.0)

## 2011-08-22 ENCOUNTER — Telehealth: Payer: Self-pay

## 2011-08-22 NOTE — Telephone Encounter (Signed)
Labs look great. Letter has been sent.

## 2011-08-22 NOTE — Telephone Encounter (Signed)
Patient called requesting lab results. He wanted to inform MD that he had forgot to fast and need to know if this will alter results.

## 2011-08-23 NOTE — Telephone Encounter (Signed)
Returned call back to patient//[facility] advising labs normal.

## 2011-10-30 ENCOUNTER — Encounter: Payer: Self-pay | Admitting: Internal Medicine

## 2012-04-21 ENCOUNTER — Other Ambulatory Visit: Payer: Self-pay

## 2012-04-21 MED ORDER — MESALAMINE 1.2 G PO TBEC: DELAYED_RELEASE_TABLET | ORAL | Status: DC

## 2012-05-14 ENCOUNTER — Other Ambulatory Visit: Payer: Self-pay | Admitting: Internal Medicine

## 2012-05-15 ENCOUNTER — Telehealth: Payer: Self-pay | Admitting: Internal Medicine

## 2012-05-15 NOTE — Telephone Encounter (Signed)
Spoke to patient and he understands that he needs a visit to keep getting his medicine refilled.  And informed him that he is overdue for his colonoscopy according to the notes.  He said he will call back and make an appointment.  He will talk to you regarding the colon and his insurance coverage when he comes in.

## 2012-06-12 ENCOUNTER — Other Ambulatory Visit: Payer: Self-pay

## 2012-06-12 MED ORDER — MESALAMINE 1.2 G PO TBEC: DELAYED_RELEASE_TABLET | ORAL | Status: DC

## 2012-06-20 ENCOUNTER — Encounter: Payer: Self-pay | Admitting: Internal Medicine

## 2012-06-20 ENCOUNTER — Ambulatory Visit (INDEPENDENT_AMBULATORY_CARE_PROVIDER_SITE_OTHER): Payer: PRIVATE HEALTH INSURANCE | Admitting: Internal Medicine

## 2012-06-20 VITALS — BP 124/80 | HR 72 | Ht 74.0 in | Wt 216.0 lb

## 2012-06-20 DIAGNOSIS — Z86010 Personal history of colonic polyps: Secondary | ICD-10-CM

## 2012-06-20 DIAGNOSIS — K5100 Ulcerative (chronic) pancolitis without complications: Secondary | ICD-10-CM

## 2012-06-20 MED ORDER — MESALAMINE 1.2 G PO TBEC: DELAYED_RELEASE_TABLET | ORAL | Status: DC

## 2012-06-20 NOTE — Patient Instructions (Signed)
Will see you for colonoscopy March or April 2014. Call back sooner if needed.  Gatha Mayer, MD, [facility]

## 2012-06-20 NOTE — Progress Notes (Signed)
  Subjective:    Patient ID: [name], male    DOB: [birth year], 56 y.o.   MRN: [id number]  HPI The patient presents for followup of ulcerative colitis. He is doing well up e fill of his medication. He explains that he has chosen not to do a colonoscopy at one-year because of cost issues. He knows the direct amended 1 sooner because of the adenoma history in the setting of his ulcerative colitis and the chance that this could represent some significant for mild dysplasia though probably not it could. He would like to have a repeat colonoscopy in March or April of 2014 2 years since his last. Medications, allergies, past medical history, past surgical history, family history and social history are reviewed and updated in the EMR.  Review of Systems He is using intermittent ibuprofen for pain when he cost. He has not noticed any change in his bowel habits associated with this.    Objective:   Physical Exam General:  NAD Eyes:   anicteric Lungs:  clear Heart:  S1S2 no rubs, murmurs or gallops Abdomen:  soft and nontender, BS+ Ext:   no edema       Assessment & Plan:

## 2012-06-20 NOTE — Assessment & Plan Note (Signed)
Colonoscopy 11/2012 - his decision to wait until then

## 2012-06-20 NOTE — Assessment & Plan Note (Addendum)
Doing well Refill Lialda He declines colonoscopy now - I had recommended at 1 year after last due to adenoma (? Dysplastic lesion) but he wants to wait until 11/2012 Advised that celebrex safer NSAID for him as opposed to ibuprofen

## 2012-06-30 ENCOUNTER — Encounter: Payer: Self-pay | Admitting: Internal Medicine

## 2012-09-08 ENCOUNTER — Encounter: Payer: Self-pay | Admitting: Internal Medicine

## 2012-10-20 ENCOUNTER — Encounter: Payer: Self-pay | Admitting: Internal Medicine

## 2012-10-20 ENCOUNTER — Ambulatory Visit (AMBULATORY_SURGERY_CENTER): Payer: BC Managed Care – PPO | Admitting: *Deleted

## 2012-10-20 VITALS — Ht 74.0 in | Wt 220.0 lb

## 2012-10-20 DIAGNOSIS — Z1211 Encounter for screening for malignant neoplasm of colon: Secondary | ICD-10-CM

## 2012-10-20 MED ORDER — NA SULFATE-K SULFATE-MG SULF 17.5-3.13-1.6 GM/177ML PO SOLN: ORAL | Status: DC

## 2012-11-04 ENCOUNTER — Encounter: Payer: PRIVATE HEALTH INSURANCE | Admitting: Internal Medicine

## 2012-11-06 ENCOUNTER — Ambulatory Visit (AMBULATORY_SURGERY_CENTER): Payer: BC Managed Care – PPO | Admitting: Internal Medicine

## 2012-11-06 ENCOUNTER — Encounter: Payer: Self-pay | Admitting: Internal Medicine

## 2012-11-06 VITALS — BP 128/79 | HR 59 | Temp 97.1°F | Resp 13 | Ht 74.0 in | Wt 220.0 lb

## 2012-11-06 DIAGNOSIS — K648 Other hemorrhoids: Secondary | ICD-10-CM

## 2012-11-06 DIAGNOSIS — K5100 Ulcerative (chronic) pancolitis without complications: Secondary | ICD-10-CM

## 2012-11-06 DIAGNOSIS — Z86010 Personal history of colonic polyps: Secondary | ICD-10-CM

## 2012-11-06 DIAGNOSIS — D126 Benign neoplasm of colon, unspecified: Secondary | ICD-10-CM

## 2012-11-06 DIAGNOSIS — K5730 Diverticulosis of large intestine without perforation or abscess without bleeding: Secondary | ICD-10-CM

## 2012-11-06 DIAGNOSIS — Z1211 Encounter for screening for malignant neoplasm of colon: Secondary | ICD-10-CM

## 2012-11-06 LAB — HM COLONOSCOPY

## 2012-11-06 MED ORDER — SODIUM CHLORIDE 0.9 % IV SOLN: 500.0000 mL | INTRAVENOUS | Status: DC

## 2012-11-06 NOTE — Patient Instructions (Addendum)
YOU HAD AN ENDOSCOPIC PROCEDURE TODAY AT [facility]: Refer to the procedure report that was given to you for any specific questions about what was found during the examination.  If the procedure report does not answer your questions, please call your gastroenterologist to clarify.  If you requested that your care partner not be given the details of your procedure findings, then the procedure report has been included in a sealed envelope for you to review at your convenience later.  YOU SHOULD EXPECT: Some feelings of bloating in the abdomen. Passage of more gas than usual.  Walking can help get rid of the air that was put into your GI tract during the procedure and reduce the bloating. If you had a lower endoscopy (such as a colonoscopy or flexible sigmoidoscopy) you may notice spotting of blood in your stool or on the toilet paper. If you underwent a bowel prep for your procedure, then you may not have a normal bowel movement for a few days.  DIET: Your first meal following the procedure should be a light meal and then it is ok to progress to your normal diet.  A half-sandwich or bowl of soup is an example of a good first meal.  Heavy or fried foods are harder to digest and may make you feel nauseous or bloated.  Likewise meals heavy in dairy and vegetables can cause extra gas to form and this can also increase the bloating.  Drink plenty of fluids but you should avoid alcoholic beverages for 24 hours.  ACTIVITY: Your care partner should take you home directly after the procedure.  You should plan to take it easy, moving slowly for the rest of the day.  You can resume normal activity the day after the procedure however you should NOT DRIVE or use heavy machinery for 24 hours (because of the sedation medicines used during the test).    SYMPTOMS TO REPORT IMMEDIATELY: A gastroenterologist can be reached at any hour.  During normal business hours, 8:30 AM to 5:00 PM Monday through Friday,  call [phone].  After hours and on weekends, please call the GI answering service at [phone] who will take a message and have the physician on call contact you.   Following lower endoscopy (colonoscopy or flexible sigmoidoscopy):  Excessive amounts of blood in the stool  Significant tenderness or worsening of abdominal pains  Swelling of the abdomen that is new, acute  Fever of 100F or higher  FOLLOW UP: If any biopsies were taken you will be contacted by phone or by letter within the next 1-3 weeks.  Call your gastroenterologist if you have not heard about the biopsies in 3 weeks.  Our staff will call the home number listed on your records the next business day following your procedure to check on you and address any questions or concerns that you may have at that time regarding the information given to you following your procedure. This is a courtesy call and so if there is no answer at the home number and we have not heard from you through the emergency physician on call, we will assume that you have returned to your regular daily activities without incident.  SIGNATURES/CONFIDENTIALITY: You and/or your care partner have signed paperwork which will be entered into your electronic medical record.  These signatures attest to the fact that that the information above on your After Visit Summary has been reviewed and is understood.  Full responsibility of the confidentiality of this  discharge information lies with you and/or your care-partner.  Diverticulosis-handout given  Hemorrhoids-handout given  Repeat colonoscopy determined by pathology  Return office visit in 1 year, may refill meds without visit

## 2012-11-06 NOTE — Op Note (Signed)
[facility]  [address] [place], [address]   COLONOSCOPY PROCEDURE REPORT  PATIENT: [name], [name]  MR#: [id number] BIRTHDATE: Jun 20, 1956 , 2  yrs. old GENDER: Male ENDOSCOPIST: Gatha Mayer, MD, [facility] PROCEDURE DATE:  11/06/2012 PROCEDURE:   Colonoscopy with biopsy ASA CLASS:   Class II INDICATIONS:Colorectal cancer screening.   [place]-risk due to hx ulcerative colitis and prior adenoma MEDICATIONS: Propofol (Diprivan) 400 mg IV, MAC sedation, administered by CRNA, and These medications were titrated to patient response per physician's verbal order  DESCRIPTION OF PROCEDURE:   After the risks benefits and alternatives of the procedure were thoroughly explained, informed consent was obtained.  A digital rectal exam revealed no abnormalities of the rectum, A digital rectal exam revealed no prostatic nodules, and A digital rectal exam revealed the prostate was not enlarged.   The LB [id number]  endoscope was introduced through the anus and advanced to the cecum, which was identified by both the appendix and ileocecal valve. No adverse events experienced.   The quality of the prep was Suprep good  The instrument was then slowly withdrawn as the colon was fully examined.      COLON FINDINGS: There was mild scattered diverticulosis noted in the right colon and left colon.   The colonic mucosa appeared normal throughout the entire examined colon.  Multiple random biopsies of the area were performed.   Small internal hemorrhoids were found. Retroflexed views revealed internal hemorrhoids. The time to cecum=3 minutes 30 seconds.  Withdrawal time=15 minutes 51 seconds. The scope was withdrawn and the procedure completed. COMPLICATIONS: There were no complications.  ENDOSCOPIC IMPRESSION: 1.   There was mild diverticulosis noted in the left colon and right colon. 2.   The colonic mucosa appeared normal throughout the entire examined colon; multiple  random biopsies of the area were performed as part of screening for dysplasia and cancer in ulcerative colitis. 3.   Small internal hemorrhoids  RECOMMENDATIONS: 1.  Timing of repeat colonoscopy will be determined by pathology findings in this patient with ulcerative colitis (looks in remission) and prior sigmoid adenoma 2012. 2.  Return office visit routine in 1 year.  May refill medications until then without visit.   eSigned:  Gatha Mayer, MD, [facility] 11/06/2012 9:04 AM   cc: The Patient

## 2012-11-06 NOTE — Progress Notes (Signed)
Patient did not experience any of the following events: a burn prior to discharge; a fall within the facility; wrong site/side/patient/procedure/implant event; or a hospital transfer or hospital admission upon discharge from the facility. (G8907) Patient did not have preoperative order for IV antibiotic SSI prophylaxis. (G8918)  

## 2012-11-07 ENCOUNTER — Telehealth: Payer: Self-pay

## 2012-11-07 NOTE — Telephone Encounter (Signed)
  Follow up Call-  Call back number 11/06/2012  Post procedure Call Back phone  # [phone]  Permission to leave phone message Yes     Patient questions:  Do you have a fever, pain , or abdominal swelling? no Pain Score  0 *  Have you tolerated food without any problems? yes  Have you been able to return to your normal activities? yes  Do you have any questions about your discharge instructions: Diet   no Medications  no Follow up visit  no  Do you have questions or concerns about your Care? no  Actions: * If pain score is 4 or above: No action needed, pain <4.

## 2012-11-11 ENCOUNTER — Encounter: Payer: Self-pay | Admitting: Internal Medicine

## 2012-11-11 ENCOUNTER — Other Ambulatory Visit (INDEPENDENT_AMBULATORY_CARE_PROVIDER_SITE_OTHER): Payer: BC Managed Care – PPO

## 2012-11-11 ENCOUNTER — Ambulatory Visit (INDEPENDENT_AMBULATORY_CARE_PROVIDER_SITE_OTHER): Payer: BC Managed Care – PPO | Admitting: Internal Medicine

## 2012-11-11 VITALS — BP 112/64 | HR 67 | Temp 97.6°F | Resp 16 | Ht 74.0 in | Wt 217.0 lb

## 2012-11-11 DIAGNOSIS — Z0000 Encounter for general adult medical examination without abnormal findings: Secondary | ICD-10-CM

## 2012-11-11 LAB — URINALYSIS, ROUTINE W REFLEX MICROSCOPIC
Bilirubin Urine: NEGATIVE
Hgb urine dipstick: NEGATIVE
Leukocytes, UA: NEGATIVE
Nitrite: NEGATIVE
Urobilinogen, UA: 0.2 (ref 0.0–1.0)
pH: 6.5 (ref 5.0–8.0)

## 2012-11-11 LAB — CBC WITH DIFFERENTIAL/PLATELET
Basophils Relative: 0.7 % (ref 0.0–3.0)
Eosinophils Relative: 1.2 % (ref 0.0–5.0)
HCT: 41 % (ref 39.0–52.0)
Monocytes Relative: 8.2 % (ref 3.0–12.0)
Neutrophils Relative %: 63 % (ref 43.0–77.0)
Platelets: 207 10*3/uL (ref 150.0–400.0)
RBC: 4.49 Mil/uL (ref 4.22–5.81)
WBC: 7.1 10*3/uL (ref 4.5–10.5)

## 2012-11-11 LAB — LIPID PANEL
LDL Cholesterol: 59 mg/dL (ref 0–99)
Total CHOL/HDL Ratio: 3

## 2012-11-11 LAB — COMPREHENSIVE METABOLIC PANEL
ALT: 27 U/L (ref 0–53)
AST: 26 U/L (ref 0–37)
Albumin: 4.3 g/dL (ref 3.5–5.2)
Alkaline Phosphatase: 53 U/L (ref 39–117)
Calcium: 9.2 mg/dL (ref 8.4–10.5)
Chloride: 103 mEq/L (ref 96–112)
Potassium: 3.9 mEq/L (ref 3.5–5.1)

## 2012-11-11 LAB — TSH: TSH: 2.16 u[IU]/mL (ref 0.35–5.50)

## 2012-11-11 NOTE — Progress Notes (Signed)
Subjective:    Patient ID: [name], male    DOB: Mar 14, 1956, 57 y.o.   MRN: [id number]  Hyperlipidemia This is a chronic problem. The current episode started more than 1 year ago. The problem is controlled. Recent lipid tests were reviewed and are variable. He has no history of chronic renal disease, diabetes, hypothyroidism, liver disease, obesity or nephrotic syndrome. There are no known factors aggravating his hyperlipidemia. Pertinent negatives include no chest pain, focal sensory loss, focal weakness, leg pain, myalgias or shortness of breath. Current antihyperlipidemic treatment includes statins. The current treatment provides moderate improvement of lipids. There are no compliance problems.       Review of Systems  Constitutional: Negative for fever, chills, diaphoresis, activity change, appetite change, fatigue and unexpected weight change.  HENT: Negative.   Eyes: Negative.   Respiratory: Negative for apnea, cough, chest tightness, shortness of breath, wheezing and stridor.   Cardiovascular: Negative for chest pain, palpitations and leg swelling.  Gastrointestinal: Negative for nausea, vomiting, abdominal pain, diarrhea, constipation and blood in stool.  Endocrine: Negative.   Genitourinary: Negative.   Musculoskeletal: Negative for myalgias, back pain, joint swelling, arthralgias and gait problem.  Skin: Negative.   Allergic/Immunologic: Negative.   Neurological: Negative for dizziness, focal weakness, weakness and light-headedness.  Hematological: Negative for adenopathy. Does not bruise/bleed easily.  Psychiatric/Behavioral: Negative.        Objective:   Physical Exam  Vitals reviewed. Constitutional: He is oriented to person, place, and time. He appears well-developed and well-nourished. No distress.  HENT:  Head: Normocephalic and atraumatic.  Mouth/Throat: Oropharynx is clear and moist. No oropharyngeal exudate.  Eyes: Conjunctivae are normal. Right eye  exhibits no discharge. Left eye exhibits no discharge. No scleral icterus.  Neck: Normal range of motion. Neck supple. No JVD present. No tracheal deviation present. No thyromegaly present.  Cardiovascular: Normal rate, regular rhythm, normal heart sounds and intact distal pulses.  Exam reveals no gallop and no friction rub.   No murmur heard. Pulmonary/Chest: Effort normal and breath sounds normal. No stridor. No respiratory distress. He has no wheezes. He has no rales. He exhibits no tenderness.  Abdominal: Soft. Bowel sounds are normal. He exhibits no distension and no mass. There is no tenderness. There is no rebound and no guarding. Hernia confirmed negative in the right inguinal area and confirmed negative in the left inguinal area.  Genitourinary: Rectum normal, prostate normal, testes normal and penis normal. Rectal exam shows no external hemorrhoid, no internal hemorrhoid, no fissure, no mass, no tenderness and anal tone normal. Guaiac negative stool. Prostate is not enlarged and not tender. Right testis shows no mass, no swelling and no tenderness. Right testis is descended. Left testis shows no mass, no swelling and no tenderness. Left testis is descended. Circumcised. No penile erythema or penile tenderness. No discharge found.  Rectal exam done by Dr. Carlean Purl - see his note  Musculoskeletal: Normal range of motion. He exhibits no edema and no tenderness.  Lymphadenopathy:    He has no cervical adenopathy.       Right: No inguinal adenopathy present.       Left: No inguinal adenopathy present.  Neurological: He is oriented to person, place, and time.  Skin: Skin is warm and dry. No rash noted. He is not diaphoretic. No erythema. No pallor.  Psychiatric: He has a normal mood and affect. His behavior is normal. Judgment and thought content normal.     Lab Results  Component Value Date  WBC 6.0 01/09/2011   HGB 14.4 01/09/2011   HCT 42 01/09/2011   PLT 188 03/08/2008   GLUCOSE 107*  03/08/2008   CHOL 132 08/21/2011   TRIG 152.0* 08/21/2011   HDL 47.60 08/21/2011   LDLDIRECT 166 01/09/2011   LDLCALC 54 08/21/2011   ALT 19 01/09/2011   AST 20 01/09/2011   NA 138 03/08/2008   K 4.5 01/09/2011   CL 102 01/09/2011   CREATININE 0.91 03/08/2008   BUN 15 03/08/2008   CO2 29 01/09/2011   PSA 3.41 02/08/2011   INR 0.9 03/08/2008       Assessment & Plan:

## 2012-11-11 NOTE — Assessment & Plan Note (Signed)
Exam done Vaccines were reviewed Labs ordered Pt ed material was given 

## 2012-11-11 NOTE — Patient Instructions (Signed)
Health Maintenance, Males A healthy lifestyle and preventative care can promote health and wellness.  Maintain regular health, dental, and eye exams.  Eat a healthy diet. Foods like vegetables, fruits, whole grains, low-fat dairy products, and lean protein foods contain the nutrients you need without too many calories. Decrease your intake of foods high in solid fats, added sugars, and salt. Get information about a proper diet from your caregiver, if necessary.  Regular physical exercise is one of the most important things you can do for your health. Most adults should get at least 150 minutes of moderate-intensity exercise (any activity that increases your heart rate and causes you to sweat) each week. In addition, most adults need muscle-strengthening exercises on 2 or more days a week.   Maintain a healthy weight. The body mass index (BMI) is a screening tool to identify possible weight problems. It provides an estimate of body fat based on height and weight. Your caregiver can help determine your BMI, and can help you achieve or maintain a healthy weight. For adults 20 years and older:  A BMI below 18.5 is considered underweight.  A BMI of 18.5 to 24.9 is normal.  A BMI of 25 to 29.9 is considered overweight.  A BMI of 30 and above is considered obese.  Maintain normal blood lipids and cholesterol by exercising and minimizing your intake of saturated fat. Eat a balanced diet with plenty of fruits and vegetables. Blood tests for lipids and cholesterol should begin at age 20 and be repeated every 5 years. If your lipid or cholesterol levels are high, you are over 50, or you are a high risk for heart disease, you may need your cholesterol levels checked more frequently.Ongoing high lipid and cholesterol levels should be treated with medicines, if diet and exercise are not effective.  If you smoke, find out from your caregiver how to quit. If you do not use tobacco, do not start.  If you  choose to drink alcohol, do not exceed 2 drinks per day. One drink is considered to be 12 ounces (355 mL) of beer, 5 ounces (148 mL) of wine, or 1.5 ounces (44 mL) of liquor.  Avoid use of street drugs. Do not share needles with anyone. Ask for help if you need support or instructions about stopping the use of drugs.  High blood pressure causes heart disease and increases the risk of stroke. Blood pressure should be checked at least every 1 to 2 years. Ongoing high blood pressure should be treated with medicines if weight loss and exercise are not effective.  If you are 45 to 57 years old, ask your caregiver if you should take aspirin to prevent heart disease.  Diabetes screening involves taking a blood sample to check your fasting blood sugar level. This should be done once every 3 years, after age 45, if you are within normal weight and without risk factors for diabetes. Testing should be considered at a younger age or be carried out more frequently if you are overweight and have at least 1 risk factor for diabetes.  Colorectal cancer can be detected and often prevented. Most routine colorectal cancer screening begins at the age of 50 and continues through age 75. However, your caregiver may recommend screening at an earlier age if you have risk factors for colon cancer. On a yearly basis, your caregiver may provide home test kits to check for hidden blood in the stool. Use of a small camera at the end of a tube,   to directly examine the colon (sigmoidoscopy or colonoscopy), can detect the earliest forms of colorectal cancer. Talk to your caregiver about this at age 50, when routine screening begins. Direct examination of the colon should be repeated every 5 to 10 years through age 75, unless early forms of pre-cancerous polyps or small growths are found.  Hepatitis C blood testing is recommended for all people born from 1945 through 1965 and any individual with known risks for hepatitis C.  Healthy  men should no longer receive prostate-specific antigen (PSA) blood tests as part of routine cancer screening. Consult with your caregiver about prostate cancer screening.  Testicular cancer screening is not recommended for adolescents or adult males who have no symptoms. Screening includes self-exam, caregiver exam, and other screening tests. Consult with your caregiver about any symptoms you have or any concerns you have about testicular cancer.  Practice safe sex. Use condoms and avoid high-risk sexual practices to reduce the spread of sexually transmitted infections (STIs).  Use sunscreen with a sun protection factor (SPF) of 30 or greater. Apply sunscreen liberally and repeatedly throughout the day. You should seek shade when your shadow is shorter than you. Protect yourself by wearing long sleeves, pants, a wide-brimmed hat, and sunglasses year round, whenever you are outdoors.  Notify your caregiver of new moles or changes in moles, especially if there is a change in shape or color. Also notify your caregiver if a mole is larger than the size of a pencil eraser.  A one-time screening for abdominal aortic aneurysm (AAA) and surgical repair of large AAAs by sound wave imaging (ultrasonography) is recommended for ages 65 to 75 years who are current or former smokers.  Stay current with your immunizations. Document Released: 02/16/2008 Document Revised: 11/12/2011 Document Reviewed: 01/15/2011 [employer] Patient Information 2013 [employer].  

## 2012-11-12 ENCOUNTER — Encounter: Payer: Self-pay | Admitting: Internal Medicine

## 2012-11-12 NOTE — Progress Notes (Signed)
Quick Note:  Benign colonic mucosa UC in remission Repeat colonoscopy 3 yrs 11/2015 ______

## 2012-11-25 ENCOUNTER — Other Ambulatory Visit: Payer: Self-pay | Admitting: Dermatology

## 2013-01-14 ENCOUNTER — Encounter: Payer: Self-pay | Admitting: Internal Medicine

## 2013-01-14 ENCOUNTER — Ambulatory Visit (INDEPENDENT_AMBULATORY_CARE_PROVIDER_SITE_OTHER): Payer: BC Managed Care – PPO | Admitting: Internal Medicine

## 2013-01-14 VITALS — BP 128/82 | HR 69 | Temp 98.2°F

## 2013-01-14 DIAGNOSIS — L03116 Cellulitis of left lower limb: Secondary | ICD-10-CM

## 2013-01-14 DIAGNOSIS — L03119 Cellulitis of unspecified part of limb: Secondary | ICD-10-CM

## 2013-01-14 DIAGNOSIS — L02419 Cutaneous abscess of limb, unspecified: Secondary | ICD-10-CM

## 2013-01-14 DIAGNOSIS — T310 Burns involving less than 10% of body surface: Secondary | ICD-10-CM

## 2013-01-14 MED ORDER — CEPHALEXIN 500 MG PO CAPS: 500.0000 mg | ORAL_CAPSULE | Freq: Four times a day (QID) | ORAL | Status: DC

## 2013-01-14 MED ORDER — SILVER SULFADIAZINE 1 % EX CREA: TOPICAL_CREAM | Freq: Every day | CUTANEOUS | Status: DC
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
## 2013-01-14 NOTE — Patient Instructions (Signed)
Burn Care Your skin is a natural barrier to infection. It is the largest organ of your body. Burns damage this natural protection. To help prevent infection, it is very important to follow your caregiver's instructions in the care of your burn. Burns are classified as:  First degree. There is only redness of the skin (erythema). No scarring is expected.  Second degree. There is blistering of the skin. Scarring may occur with deeper burns.  Third degree. All layers of the skin are injured, and scarring is expected. HOME CARE INSTRUCTIONS   Wash your hands well before changing your bandage.  Change your bandage as often as directed by your caregiver.  Remove the old bandage. If the bandage sticks, you may soak it off with cool, clean water.  Cleanse the burn thoroughly but gently with mild soap and water.  Pat the area dry with a clean, dry cloth.  Apply a thin layer of antibacterial cream to the burn.  Apply a clean bandage as instructed by your caregiver.  Keep the bandage as clean and dry as possible.  Elevate the affected area for the first 24 hours, then as instructed by your caregiver.  Only take over-the-counter or prescription medicines for pain, discomfort, or fever as directed by your caregiver. SEEK IMMEDIATE MEDICAL CARE IF:   You develop excessive pain.  You develop redness, tenderness, swelling, or red streaks near the burn.  The burned area develops yellowish-white fluid (pus) or a bad smell.  You have a fever. MAKE SURE YOU:   Understand these instructions.  Will watch your condition.  Will get help right away if you are not doing well or get worse. Document Released: 08/20/2005 Document Revised: 11/12/2011 Document Reviewed: 01/10/2011 [facility] Patient Information 2013 [place].

## 2013-01-15 ENCOUNTER — Ambulatory Visit: Payer: BC Managed Care – PPO | Admitting: Internal Medicine

## 2013-01-17 ENCOUNTER — Encounter: Payer: Self-pay | Admitting: Internal Medicine

## 2013-01-17 ENCOUNTER — Ambulatory Visit (INDEPENDENT_AMBULATORY_CARE_PROVIDER_SITE_OTHER): Payer: BC Managed Care – PPO | Admitting: Internal Medicine

## 2013-01-17 VITALS — BP 128/84 | HR 61 | Temp 98.2°F | Wt 218.8 lb

## 2013-01-17 DIAGNOSIS — T24202D Burn of second degree of unspecified site of left lower limb, except ankle and foot, subsequent encounter: Secondary | ICD-10-CM

## 2013-01-17 DIAGNOSIS — Z5189 Encounter for other specified aftercare: Secondary | ICD-10-CM

## 2013-01-17 DIAGNOSIS — T24202A Burn of second degree of unspecified site of left lower limb, except ankle and foot, initial encounter: Secondary | ICD-10-CM | POA: Insufficient documentation

## 2013-01-17 NOTE — Assessment & Plan Note (Signed)
Seems to be healing well Discussed gentle debridement with soap on clean washcloth, then daily silvadene Finish the cephalexin but infection appears improved Reassured overall
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
## 2013-04-12 ENCOUNTER — Other Ambulatory Visit: Payer: Self-pay | Admitting: Internal Medicine

## 2013-05-11 ENCOUNTER — Other Ambulatory Visit: Payer: Self-pay | Admitting: Internal Medicine

## 2013-06-05 ENCOUNTER — Other Ambulatory Visit: Payer: Self-pay | Admitting: Internal Medicine

## 2013-06-26 ENCOUNTER — Other Ambulatory Visit: Payer: Self-pay | Admitting: Dermatology

## 2013-09-21 ENCOUNTER — Ambulatory Visit (INDEPENDENT_AMBULATORY_CARE_PROVIDER_SITE_OTHER): Payer: BC Managed Care – PPO | Admitting: Physician Assistant

## 2013-09-21 ENCOUNTER — Encounter: Payer: Self-pay | Admitting: Physician Assistant

## 2013-09-21 ENCOUNTER — Ambulatory Visit (INDEPENDENT_AMBULATORY_CARE_PROVIDER_SITE_OTHER)
Admission: RE | Admit: 2013-09-21 | Discharge: 2013-09-21 | Disposition: A | Discharge status: Home / Self Care | Payer: BC Managed Care – PPO | Source: Ambulatory Visit | Attending: Physician Assistant | Admitting: Physician Assistant

## 2013-09-21 VITALS — BP 132/80 | HR 97 | Temp 99.1°F | Wt 224.4 lb

## 2013-09-21 DIAGNOSIS — R05 Cough: Secondary | ICD-10-CM

## 2013-09-21 DIAGNOSIS — R059 Cough, unspecified: Secondary | ICD-10-CM

## 2013-09-21 MED ORDER — ALBUTEROL SULFATE HFA 108 (90 BASE) MCG/ACT IN AERS: 2.0000 | INHALATION_SPRAY | Freq: Four times a day (QID) | RESPIRATORY_TRACT | Status: DC | PRN

## 2013-09-21 NOTE — Progress Notes (Signed)
Subjective:    Patient ID: [name], male    DOB: [birth year], 58 y.o.   MRN: [id number]  HPI Comments: Patient is a 58 year old male who presents to the office with complaint of bronchitis like symptoms. Patient reports he has had a disruptive cough for the last 5-6 days. Reports the cough is productive of a clear to cloudy mucus. States he has noticed himself wheezing at times. States a few days prior to the cough he had a running nose but that cleared up since. States cough become worse when exerting himself or trying to take deep breaths. Has used a cough syrup (unknown brand) at home with mild relief of symptoms as well as cough drops.  Denies history of allergies or asthma. Denies fever, chills, sick contacts, sinus or ear pain, sore throat or difficulty swallowing.    Review of Systems  Constitutional: Negative for fever, chills and appetite change.  HENT: Positive for rhinorrhea. Negative for facial swelling, sinus pressure, sore throat and trouble swallowing.   Respiratory: Positive for cough (productive ) and wheezing. Negative for chest tightness and shortness of breath.   Gastrointestinal: Negative for nausea, vomiting, diarrhea and constipation.  Musculoskeletal: Negative for arthralgias and myalgias.  Neurological: Negative for dizziness and light-headedness.  All other systems reviewed and are negative.      Objective:   Physical Exam  Vitals reviewed. Constitutional: He appears well-developed and well-nourished. No distress.  HENT:  Head: Normocephalic and atraumatic.  Right Ear: Hearing, tympanic membrane, external ear and ear canal normal.  Left Ear: Hearing, tympanic membrane, external ear and ear canal normal.  Nose: Rhinorrhea present.  Mouth/Throat: Uvula is midline and mucous membranes are normal. No posterior oropharyngeal edema or posterior oropharyngeal erythema.  Clear post nasal drainage present.  Neck: Trachea normal.  Cardiovascular: Normal rate  and regular rhythm.   Pulmonary/Chest: Effort normal. He has no decreased breath sounds. He has wheezes (mild expiratory bilateral). He has no rhonchi. He has no rales.  Abdominal: Soft. There is no tenderness.  Lymphadenopathy:    He has no cervical adenopathy.       Right: No supraclavicular adenopathy present.       Left: No supraclavicular adenopathy present.  Skin: Skin is warm and dry.    Past Medical History  Diagnosis Date  . Bipolar 2 disorder   . Alcoholism     10 yrs mostly sober in [place]   . Ulcerative colitis 11-23-2010    Colonoscopy with [facility]  . Hyperlipemia   . Rosacea   . Hemorrhoids 11-23-2010    Colonoscopy with [facility]  . Personal history of colonic polyps 11-23-2010    5 mm adenoma Colonoscopy with [facility]  . Depression     Pulse Readings from Last 3 Encounters:  09/21/13 97  01/17/13 61  01/14/13 69       Assessment & Plan:   Cough: Patient is slightly febrile and tachycardic at this visit. Reviewed pulse readings from last previous encounters.  Mild suspicion for pneumonia. Will obtain chest xray and review.  Patient proved Rx for albuterol (PROVENTIL HFA;VENTOLIN HFA) 108 (90 BASE) MCG/ACT inhaler 2 puffs every 6 hours as needed for wheezing. Instructed patient on proper use of inhaler. Instructed can continue use of cough syrup at home or switch to Delsym for cough suppression. Instructed to increase fluid intake. Explained to patient if necessary, based on xray results, will phone in antibiotic.   Patient stated understanding and agreement  with treatment plan.

## 2013-09-21 NOTE — Progress Notes (Signed)
Pre-visit discussion using our clinic review tool. No additional management support is needed unless otherwise documented below in the visit note.  

## 2013-09-21 NOTE — Patient Instructions (Signed)
Very nice to meet you today Mr. [name]!  I have ordered a chest xray and will review as soon as resulted.  Please use inhaler as instructed.  RTO if no improvement of symptoms.   Cough, Adult  A cough is a reflex. It helps you clear your throat and airways. A cough can help heal your body. A cough can last 2 or 3 weeks (acute) or may last more than 8 weeks (chronic). Some common causes of a cough can include an infection, allergy, or a cold. HOME CARE  Only take medicine as told by your doctor.  If given, take your medicines (antibiotics) as told. Finish them even if you start to feel better.  Use a cold steam vaporizer or humidier in your home. This can help loosen thick spit (secretions).  Sleep so you are almost sitting up (semi-upright). Use pillows to do this. This helps reduce coughing.  Rest as needed.  Stop smoking if you smoke. GET HELP RIGHT AWAY IF:  You have yellowish-white fluid (pus) in your thick spit.  Your cough gets worse.  Your medicine does not reduce coughing, and you are losing sleep.  You cough up blood.  You have trouble breathing.  Your pain gets worse and medicine does not help.  You have a fever. MAKE SURE YOU:   Understand these instructions.  Will watch your condition.  Will get help right away if you are not doing well or get worse. Document Released: 05/03/2011 Document Revised: 11/12/2011 Document Reviewed: 05/03/2011 [facility] Patient Information 2014 [place].

## 2013-09-22 ENCOUNTER — Telehealth: Payer: Self-pay

## 2013-09-22 NOTE — Telephone Encounter (Signed)
Called pt see results note LMOM with [name] response...[name]

## 2013-09-22 NOTE — Telephone Encounter (Signed)
Phone call from patient [phone] states he was seen yesterday and had xrays done and would like the results. Please advise.

## 2013-10-07 ENCOUNTER — Other Ambulatory Visit: Payer: Self-pay | Admitting: Internal Medicine

## 2013-10-15 ENCOUNTER — Telehealth: Payer: Self-pay | Admitting: *Deleted

## 2013-10-15 NOTE — Telephone Encounter (Signed)
Patient phoned stating that his insurance company is denying a claim for a chest xray 09/21/13, stating it wasn't pre-authorized.  CB# [phone]

## 2013-10-16 NOTE — Telephone Encounter (Signed)
Please review prior comments and advise.

## 2013-10-28 ENCOUNTER — Telehealth: Payer: Self-pay | Admitting: *Deleted

## 2013-10-28 NOTE — Telephone Encounter (Signed)
yes

## 2013-10-28 NOTE — Telephone Encounter (Signed)
Patient phoned inquiring if PCP is taking on new patients-he has a friend he'd like to refer to [facility].  Please advise  CB# [phone]

## 2013-10-28 NOTE — Telephone Encounter (Signed)
Notified patient that PCP is taking on new patients per MD.

## 2013-11-13 ENCOUNTER — Encounter: Payer: BC Managed Care – PPO | Admitting: Internal Medicine

## 2013-11-29 ENCOUNTER — Other Ambulatory Visit: Payer: Self-pay | Admitting: Internal Medicine

## 2013-11-30 ENCOUNTER — Telehealth: Payer: Self-pay | Admitting: Internal Medicine

## 2013-12-15 ENCOUNTER — Encounter: Payer: BC Managed Care – PPO | Admitting: Internal Medicine

## 2013-12-21 ENCOUNTER — Telehealth: Payer: Self-pay | Admitting: Internal Medicine

## 2013-12-21 NOTE — Telephone Encounter (Signed)
Left message for patient to call back  

## 2013-12-22 NOTE — Telephone Encounter (Signed)
Patient wanted to know if he could wait another year to see you in the office.  He has "absolutely no symptoms" he is taking lialda 2 qd.  Is it ok to refill meds without an office visit.  He had a colonoscopy with you last March and his UC was in remission.  Please advise

## 2013-12-28 NOTE — Telephone Encounter (Signed)
Thought I had answered  YES may wait another year as long as he is seeing PCP and he appears to be

## 2013-12-28 NOTE — Telephone Encounter (Signed)
Please advise on question below

## 2013-12-28 NOTE — Telephone Encounter (Signed)
Patient's upcoming appt cancelled.  I have left a voicemail for the patient with response

## 2013-12-30 ENCOUNTER — Telehealth: Payer: Self-pay | Admitting: Internal Medicine

## 2013-12-30 MED ORDER — MESALAMINE 1.2 G PO TBEC: 2.4000 g | DELAYED_RELEASE_TABLET | Freq: Every day | ORAL | Status: DC

## 2013-12-30 NOTE — Telephone Encounter (Signed)
Refilled as requested, ok per Dr. Celesta Aver note dated 12/21/13.

## 2013-12-31 ENCOUNTER — Other Ambulatory Visit: Payer: Self-pay | Admitting: Internal Medicine

## 2013-12-31 NOTE — Telephone Encounter (Signed)
Sent this in yesterday so this may be duplicate request.

## 2014-01-01 NOTE — Telephone Encounter (Signed)
See Previous Encounter

## 2014-01-19 ENCOUNTER — Other Ambulatory Visit: Payer: Self-pay | Admitting: Dermatology

## 2014-01-20 ENCOUNTER — Ambulatory Visit: Payer: BC Managed Care – PPO | Admitting: Internal Medicine

## 2014-01-28 ENCOUNTER — Telehealth: Payer: Self-pay

## 2014-01-28 NOTE — Telephone Encounter (Signed)
Patient called stating that he has a friend that has similar problems and would like to know if [facility] would accept as a new patient. Per Dr. Ronnald Ramp, yes he will accept, pt notified.

## 2014-02-22 ENCOUNTER — Other Ambulatory Visit: Payer: Self-pay | Admitting: Internal Medicine

## 2014-04-22 ENCOUNTER — Other Ambulatory Visit: Payer: Self-pay | Admitting: Internal Medicine

## 2014-07-07 ENCOUNTER — Telehealth: Payer: Self-pay | Admitting: Internal Medicine

## 2014-07-07 NOTE — Telephone Encounter (Signed)
Left patient detailed message on his voice mail that no generic for Lialda. Told him to ask his insurance carrier what they cover and Dr. Carlean Purl would be the one to decide which of the choices would be best for him.

## 2014-07-09 ENCOUNTER — Ambulatory Visit (INDEPENDENT_AMBULATORY_CARE_PROVIDER_SITE_OTHER): Payer: BC Managed Care – PPO | Admitting: Family Medicine

## 2014-07-09 VITALS — BP 160/95 | HR 98 | Temp 99.4°F | Resp 16 | Ht 74.0 in | Wt 223.0 lb

## 2014-07-09 DIAGNOSIS — R51 Headache: Secondary | ICD-10-CM

## 2014-07-09 DIAGNOSIS — R519 Headache, unspecified: Secondary | ICD-10-CM

## 2014-07-09 DIAGNOSIS — S0101XA Laceration without foreign body of scalp, initial encounter: Secondary | ICD-10-CM
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
## 2014-07-09 NOTE — Patient Instructions (Signed)

## 2014-07-09 NOTE — Progress Notes (Signed)
Verbal consent obtained. The patient was anesthetized with 10 cc of 1% with epi.  The wound was vigorously scrubbed with soap and water.  The wound was then closed with 11 staples.  The wound was then cleaned with NS. Patient instructed to come back for removal in 10 days.   Philis Fendt, MS, PA-C   2:32 PM, 07/09/2014

## 2014-07-18 ENCOUNTER — Ambulatory Visit (INDEPENDENT_AMBULATORY_CARE_PROVIDER_SITE_OTHER): Payer: BC Managed Care – PPO | Admitting: Internal Medicine

## 2014-07-18 VITALS — BP 118/82 | HR 106 | Temp 98.2°F | Ht 74.0 in | Wt 228.1 lb

## 2014-07-18 DIAGNOSIS — S0101XD Laceration without foreign body of scalp, subsequent encounter: Secondary | ICD-10-CM

## 2014-07-18 DIAGNOSIS — Z4802 Encounter for removal of sutures: Secondary | ICD-10-CM

## 2014-07-18 NOTE — Progress Notes (Signed)
Follow-up after scalp laceration for removal of staples No headache or dizziness since the event No pain to injury site  Exam BP 118/82 mmHg  Pulse 106  Temp(Src) 98.2 F (36.8 C) (Oral)  Ht 6' 2"  (1.88 m)  Wt 228 lb 2 oz (103.477 kg)  BMI 29.28 kg/m2  SpO2 93% Wound is well-healed without signs of infection   Staples removed by CNA  Impression  #1 laceration healing No need for further wound care

## 2014-08-20 ENCOUNTER — Encounter: Payer: BC Managed Care – PPO | Admitting: Internal Medicine

## 2014-08-23 ENCOUNTER — Other Ambulatory Visit: Payer: Self-pay | Admitting: Dermatology

## 2014-08-30 ENCOUNTER — Encounter: Payer: BC Managed Care – PPO | Admitting: Internal Medicine

## 2014-08-30 ENCOUNTER — Other Ambulatory Visit: Payer: Self-pay | Admitting: Internal Medicine

## 2014-11-02 ENCOUNTER — Other Ambulatory Visit: Payer: Self-pay | Admitting: Internal Medicine

## 2014-12-27 ENCOUNTER — Other Ambulatory Visit: Payer: Self-pay | Admitting: Dermatology

## 2015-01-03 ENCOUNTER — Other Ambulatory Visit: Payer: Self-pay | Admitting: Internal Medicine

## 2015-01-27 ENCOUNTER — Encounter: Payer: Self-pay | Admitting: Internal Medicine

## 2015-01-29 ENCOUNTER — Other Ambulatory Visit: Payer: Self-pay | Admitting: Internal Medicine

## 2015-02-02 ENCOUNTER — Other Ambulatory Visit: Payer: Self-pay | Admitting: Internal Medicine

## 2015-02-02 ENCOUNTER — Encounter: Payer: Self-pay | Admitting: Internal Medicine

## 2015-02-25 ENCOUNTER — Other Ambulatory Visit: Payer: Self-pay | Admitting: Internal Medicine

## 2015-02-28 ENCOUNTER — Other Ambulatory Visit: Payer: Self-pay | Admitting: Internal Medicine

## 2015-03-08 ENCOUNTER — Telehealth: Payer: Self-pay | Admitting: Internal Medicine

## 2015-03-11 NOTE — Telephone Encounter (Signed)
error 

## 2015-03-17 ENCOUNTER — Other Ambulatory Visit (INDEPENDENT_AMBULATORY_CARE_PROVIDER_SITE_OTHER): Payer: BLUE CROSS/BLUE SHIELD

## 2015-03-17 ENCOUNTER — Encounter: Payer: Self-pay | Admitting: Internal Medicine

## 2015-03-17 ENCOUNTER — Other Ambulatory Visit: Payer: BLUE CROSS/BLUE SHIELD

## 2015-03-17 ENCOUNTER — Ambulatory Visit (INDEPENDENT_AMBULATORY_CARE_PROVIDER_SITE_OTHER): Payer: BLUE CROSS/BLUE SHIELD | Admitting: Internal Medicine

## 2015-03-17 VITALS — BP 136/86 | HR 68 | Temp 98.7°F | Resp 16 | Ht 74.0 in | Wt 218.0 lb

## 2015-03-17 DIAGNOSIS — Z23 Encounter for immunization: Secondary | ICD-10-CM

## 2015-03-17 DIAGNOSIS — Z0000 Encounter for general adult medical examination without abnormal findings: Secondary | ICD-10-CM | POA: Diagnosis not present

## 2015-03-17 LAB — LIPID PANEL
Cholesterol: 167 mg/dL (ref 0–200)
HDL: 54 mg/dL (ref >39.00)
LDL CALC: 86 mg/dL (ref 0–99)
NONHDL: 113
Total CHOL/HDL Ratio: 3
Triglycerides: 133 mg/dL (ref 0.0–149.0)
VLDL: 26.6 mg/dL (ref 0.0–40.0)

## 2015-03-17 LAB — COMPREHENSIVE METABOLIC PANEL
ALT: 27 U/L (ref 0–53)
AST: 25 U/L (ref 0–37)
Albumin: 4.8 g/dL (ref 3.5–5.2)
Alkaline Phosphatase: 67 U/L (ref 39–117)
BILIRUBIN TOTAL: 0.9 mg/dL (ref 0.2–1.2)
BUN: 13 mg/dL (ref 6–23)
CO2: 28 mEq/L (ref 19–32)
CREATININE: 0.98 mg/dL (ref 0.40–1.50)
Calcium: 10 mg/dL (ref 8.4–10.5)
Chloride: 104 mEq/L (ref 96–112)
GFR: 83.18 mL/min (ref >60.00)
Glucose, Bld: 98 mg/dL (ref 70–99)
POTASSIUM: 3.9 meq/L (ref 3.5–5.1)
SODIUM: 140 meq/L (ref 135–145)
Total Protein: 7.5 g/dL (ref 6.0–8.3)

## 2015-03-17 LAB — CBC WITH DIFFERENTIAL/PLATELET
BASOS ABS: 0 10*3/uL (ref 0.0–0.1)
Basophils Relative: 0.6 % (ref 0.0–3.0)
EOS PCT: 1.5 % (ref 0.0–5.0)
Eosinophils Absolute: 0.1 10*3/uL (ref 0.0–0.7)
HEMATOCRIT: 43.7 % (ref 39.0–52.0)
Hemoglobin: 14.9 g/dL (ref 13.0–17.0)
Lymphocytes Relative: 25.7 % (ref 12.0–46.0)
Lymphs Abs: 2 10*3/uL (ref 0.7–4.0)
MCHC: 34.1 g/dL (ref 30.0–36.0)
MCV: 92.9 fl (ref 78.0–100.0)
MONOS PCT: 9.3 % (ref 3.0–12.0)
Monocytes Absolute: 0.7 10*3/uL (ref 0.1–1.0)
NEUTROS PCT: 62.9 % (ref 43.0–77.0)
Neutro Abs: 4.8 10*3/uL (ref 1.4–7.7)
PLATELETS: 239 10*3/uL (ref 150.0–400.0)
RBC: 4.7 Mil/uL (ref 4.22–5.81)
RDW: 12.9 % (ref 11.5–15.5)
WBC: 7.7 10*3/uL (ref 4.0–10.5)

## 2015-03-17 LAB — PSA: PSA: 4.16 ng/mL — AB (ref 0.10–4.00)

## 2015-03-17 LAB — TSH: TSH: 1.87 u[IU]/mL (ref 0.35–4.50)

## 2015-03-17 LAB — FECAL OCCULT BLOOD, GUAIAC: Fecal Occult Blood: NEGATIVE

## 2015-03-17 NOTE — Patient Instructions (Signed)

## 2015-03-17 NOTE — Progress Notes (Signed)
Pre visit review using our clinic review tool, if applicable. No additional management support is needed unless otherwise documented below in the visit note. 

## 2015-03-18 ENCOUNTER — Encounter: Payer: Self-pay | Admitting: Internal Medicine

## 2015-03-18 NOTE — Progress Notes (Signed)
Subjective:  Patient ID: [name], male    DOB: 1956-07-12  Age: 59 y.o. MRN: [id number]  CC: Annual Exam   HPI [name] presents for a complete physical. He offers no complaints today.  Outpatient Prescriptions Prior to Visit  Medication Sig Dispense Refill  . atorvastatin (LIPITOR) 80 MG tablet TAKE 1 TABLET (80 MG TOTAL) BY MOUTH DAILY. 30 tablet 2  . DULoxetine (CYMBALTA) 60 MG capsule Take 60 mg by mouth daily.    Marland Kitchen LIALDA 1.2 G EC tablet TAKE 2 TABLET(S) BY MOUTH ONCE A DAY **PLEASE CALL FOR APPOINTMENT** 60 tablet 0  . mesalamine (LIALDA) 1.2 G EC tablet Take 2 tablets (2.4 g total) by mouth daily with breakfast. 60 tablet 11  . Multiple Vitamin (MULTIVITAMIN) capsule Take 1 capsule by mouth daily.      . Omega-3 Fatty Acids (FISH OIL) 1200 MG CAPS Take by mouth 2 (two) times daily.      Marland Kitchen atorvastatin (LIPITOR) 80 MG tablet TAKE 1 TABLET (80 MG TOTAL) BY MOUTH DAILY. 30 tablet 2  . atorvastatin (LIPITOR) 80 MG tablet TAKE 1 TABLET (80 MG TOTAL) BY MOUTH DAILY. 30 tablet 2  . lamoTRIgine (LAMICTAL) 100 MG tablet Take 100 mg by mouth 2 (two) times daily.     Marland Kitchen LIALDA 1.2 G EC tablet TAKE 2 TABLET(S) BY MOUTH ONCE A DAY 60 tablet 11  . LIALDA 1.2 G EC tablet TAKE 2 TABLET(S) BY MOUTH ONCE A DAY 60 tablet 0  . minocycline (MINOCIN,DYNACIN) 100 MG capsule Take 100 mg by mouth 2 (two) times daily.      . QUEtiapine (SEROQUEL) 300 MG tablet Take 300 mg by mouth at bedtime.       No facility-administered medications prior to visit.    ROS Review of Systems  Constitutional: Negative.  Negative for fever, chills, diaphoresis, appetite change and fatigue.  HENT: Negative.   Eyes: Negative.   Respiratory: Negative.  Negative for cough, choking, chest tightness, shortness of breath and stridor.   Cardiovascular: Negative.  Negative for chest pain, palpitations and leg swelling.  Gastrointestinal: Negative.  Negative for nausea, vomiting, abdominal pain, diarrhea,  constipation and blood in stool.  Endocrine: Negative.   Genitourinary: Negative.  Negative for dysuria, urgency, hematuria, flank pain, difficulty urinating and testicular pain.  Musculoskeletal: Negative.  Negative for myalgias, back pain, arthralgias and neck pain.  Skin: Negative.   Allergic/Immunologic: Negative.   Neurological: Negative.  Negative for dizziness, tremors, facial asymmetry, weakness and numbness.  Hematological: Negative.   Psychiatric/Behavioral: Negative.     Objective:  BP 136/86 mmHg  Pulse 68  Temp(Src) 98.7 F (37.1 C) (Oral)  Resp 16  Ht 6' 2"  (1.88 m)  Wt 218 lb (98.884 kg)  BMI 27.98 kg/m2  SpO2 98%  BP Readings from Last 3 Encounters:  03/17/15 136/86  07/18/14 118/82  07/09/14 160/95    Wt Readings from Last 3 Encounters:  03/17/15 218 lb (98.884 kg)  07/18/14 228 lb 2 oz (103.477 kg)  07/09/14 223 lb (101.152 kg)    Physical Exam  Constitutional: He is oriented to person, place, and time. No distress.  HENT:  Mouth/Throat: Oropharynx is clear and moist. No oropharyngeal exudate.  Eyes: Conjunctivae are normal. Right eye exhibits no discharge. Left eye exhibits no discharge. No scleral icterus.  Neck: Normal range of motion. Neck supple. No JVD present. No tracheal deviation present. No thyromegaly present.  Cardiovascular: Normal rate, regular rhythm, normal heart sounds and intact distal  pulses.  Exam reveals no gallop and no friction rub.   No murmur heard. Pulmonary/Chest: Effort normal and breath sounds normal. No stridor. No respiratory distress. He has no wheezes. He has no rales. He exhibits no tenderness.  Abdominal: Soft. Bowel sounds are normal. He exhibits no distension and no mass. There is no tenderness. There is no rebound and no guarding. Hernia confirmed negative in the right inguinal area and confirmed negative in the left inguinal area.  Genitourinary: Rectum normal, testes normal and penis normal. Rectal exam shows no  external hemorrhoid, no internal hemorrhoid, no fissure, no mass, no tenderness and anal tone normal. Guaiac negative stool. Prostate is enlarged (1+ smooth symm BPH). Prostate is not tender. Right testis shows no mass, no swelling and no tenderness. Right testis is descended. Left testis shows no mass, no swelling and no tenderness. Left testis is descended. No penile erythema or penile tenderness. No discharge found.  Musculoskeletal: Normal range of motion. He exhibits no edema.  Lymphadenopathy:    He has no cervical adenopathy.       Right: No inguinal adenopathy present.       Left: No inguinal adenopathy present.  Neurological: He is oriented to person, place, and time.  Skin: Skin is warm and dry. No rash noted. He is not diaphoretic. No erythema. No pallor.  Psychiatric: He has a normal mood and affect. His behavior is normal. Judgment and thought content normal.  Vitals reviewed.   Lab Results  Component Value Date   WBC 7.7 03/17/2015   HGB 14.9 03/17/2015   HCT 43.7 03/17/2015   PLT 239.0 03/17/2015   GLUCOSE 98 03/17/2015   CHOL 167 03/17/2015   TRIG 133.0 03/17/2015   HDL 54.00 03/17/2015   LDLDIRECT 166 01/09/2011   LDLCALC 86 03/17/2015   ALT 27 03/17/2015   AST 25 03/17/2015   NA 140 03/17/2015   K 3.9 03/17/2015   CL 104 03/17/2015   CREATININE 0.98 03/17/2015   BUN 13 03/17/2015   CO2 28 03/17/2015   TSH 1.87 03/17/2015   PSA 4.16* 03/17/2015   INR 0.9 03/08/2008    Dg Chest 2 View  09/21/2013   CLINICAL DATA:  Cough  EXAM: CHEST - 2 VIEW  COMPARISON:  03/08/2008  FINDINGS: Lungs are clear. Heart size and mediastinal contours are within normal limits. No effusion. Visualized skeletal structures are unremarkable.  IMPRESSION: No acute cardiopulmonary disease.   Electronically Signed   By: Arne Cleveland M.D.   On: 09/21/2013 14:24    Assessment & Plan:   [name] was seen today for annual exam.  Diagnoses and all orders for this visit:  Routine general  medical examination at a health care facility- exam done, vaccines were reviewed and updated, labs ordered and reviewed. His colonoscopy is up-to-date. He was given patient education material. His PSA is slightly elevated so I have asked him to return in 4 months for recheck on this. Orders: -     Lipid panel; Future -     Comprehensive metabolic panel; Future -     CBC with Differential/Platelet; Future -     PSA; Future -     TSH; Future  Need for prophylactic vaccination against Streptococcus pneumoniae (pneumococcus) Orders: -     Pneumococcal polysaccharide vaccine 23-valent greater than or equal to 2yo subcutaneous/IM   I have discontinued Mr. Pigford's minocycline. I am also having him maintain his multivitamin, Fish Oil, DULoxetine, mesalamine, atorvastatin, LIALDA, ampicillin, lamoTRIgine, and QUEtiapine.  Meds ordered this encounter  Medications  . ampicillin (PRINCIPEN) 500 MG capsule    Sig:     Refill:  1  . lamoTRIgine (LAMICTAL) 200 MG tablet    Sig:     Refill:  1  . QUEtiapine (SEROQUEL) 400 MG tablet    Sig:     Refill:  1     Follow-up: Return if symptoms worsen or fail to improve.  [name], MD

## 2015-03-24 ENCOUNTER — Other Ambulatory Visit: Payer: Self-pay | Admitting: Internal Medicine

## 2015-04-12 ENCOUNTER — Other Ambulatory Visit: Payer: Self-pay | Admitting: Internal Medicine

## 2015-05-11 ENCOUNTER — Other Ambulatory Visit: Payer: Self-pay | Admitting: Internal Medicine

## 2015-05-16 ENCOUNTER — Telehealth: Payer: Self-pay | Admitting: Internal Medicine

## 2015-05-16 NOTE — Telephone Encounter (Signed)
Dr. Carlean Purl patient is due next march for colon recall. He wants to schedule now, he has met his deductible for the year.  Please advise

## 2015-05-16 NOTE — Telephone Encounter (Signed)
Please help him schedule at his convenience

## 2015-05-16 NOTE — Telephone Encounter (Signed)
That is acceptable to do for surveillance of UC

## 2015-05-30 ENCOUNTER — Other Ambulatory Visit: Payer: Self-pay | Admitting: Internal Medicine

## 2015-06-03 ENCOUNTER — Encounter: Payer: Self-pay | Admitting: Internal Medicine

## 2015-06-20 NOTE — Telephone Encounter (Signed)
Appt Scheduled w/pt for 07-07-15.

## 2015-06-23 ENCOUNTER — Ambulatory Visit (AMBULATORY_SURGERY_CENTER): Payer: Self-pay | Admitting: *Deleted

## 2015-06-23 VITALS — Ht 74.0 in | Wt 216.0 lb

## 2015-06-23 DIAGNOSIS — K5180 Other ulcerative colitis without complications: Secondary | ICD-10-CM

## 2015-06-23 NOTE — Progress Notes (Signed)
No egg or soy allergy. No anesthesia problems.  No home O2.  No diet meds.

## 2015-07-04 ENCOUNTER — Encounter: Payer: Self-pay | Admitting: Internal Medicine

## 2015-07-04 ENCOUNTER — Other Ambulatory Visit: Payer: Self-pay | Admitting: Internal Medicine

## 2015-07-06 ENCOUNTER — Ambulatory Visit: Payer: BLUE CROSS/BLUE SHIELD | Admitting: Gastroenterology

## 2015-07-07 ENCOUNTER — Ambulatory Visit (AMBULATORY_SURGERY_CENTER): Payer: BLUE CROSS/BLUE SHIELD | Admitting: Internal Medicine

## 2015-07-07 ENCOUNTER — Encounter: Payer: Self-pay | Admitting: Internal Medicine

## 2015-07-07 VITALS — BP 129/87 | HR 54 | Temp 97.7°F | Resp 14 | Ht 74.0 in | Wt 216.0 lb

## 2015-07-07 DIAGNOSIS — D125 Benign neoplasm of sigmoid colon: Secondary | ICD-10-CM

## 2015-07-07 DIAGNOSIS — Z86010 Personal history of colonic polyps: Secondary | ICD-10-CM | POA: Diagnosis not present

## 2015-07-07 DIAGNOSIS — K5180 Other ulcerative colitis without complications: Secondary | ICD-10-CM

## 2015-07-07 DIAGNOSIS — D123 Benign neoplasm of transverse colon: Secondary | ICD-10-CM

## 2015-07-07 MED ORDER — SODIUM CHLORIDE 0.9 % IV SOLN: 500.0000 mL | INTRAVENOUS | Status: DC

## 2015-07-07 NOTE — Op Note (Signed)
[place]  [address]. [place], [address]   COLONOSCOPY PROCEDURE REPORT  PATIENT: Timothee, Gali  MR#: [id number] BIRTHDATE: 10/17/1955 , 75  yrs. old GENDER: male ENDOSCOPIST: Gatha Mayer, MD, [facility] PROCEDURE DATE:  07/07/2015 PROCEDURE:   Colonoscopy, diagnostic, Colonoscopy, surveillance , Colonoscopy with biopsy, and Colonoscopy with snare polypectomy First Screening Colonoscopy - Avg.  risk and is 50 yrs.  old or older - No.  Prior Negative Screening - Now for repeat screening. N/A  History of Adenoma - Now for follow-up colonoscopy & has been > or = to 3 yrs.  N/A  Polyps removed today? Yes ASA CLASS:   Class II INDICATIONS:Screening for colonic neoplasia, PH Colon Adenoma, and Inflammatory Bowel Disease ( = 8 years pancolitis or = 15 years left sided colitis). MEDICATIONS: Propofol 550 mg IV and Monitored anesthesia care  DESCRIPTION OF PROCEDURE:   After the risks benefits and alternatives of the procedure were thoroughly explained, informed consent was obtained.  The digital rectal exam revealed no abnormalities of the rectum, revealed no prostatic nodules, and revealed the prostate was not enlarged.   The [id number] endoscope was introduced through the anus and advanced to the cecum, which was identified by both the appendix and ileocecal valve. No adverse events experienced.   The quality of the prep was excellent.  (MiraLax was used)  The instrument was then slowly withdrawn as the colon was fully examined. Estimated blood loss is zero unless otherwise noted in this procedure report.  COLON FINDINGS: Three polypoid shaped sessile polyps ranging from 3 to 63m in size were found in the sigmoid colon (2) and transverse colon (1).  Polypectomies were performed with cold forceps (1) and with a cold snare (2). Surrounding normal tissue removed.  The resection was complete, the polyp tissue was completely retrieved and sent to histology.    The colonic mucosa appeared normal throughout the entire examined colon.  Multiple random biopsies were performed using cold forceps.  Sample was obtained and sent to histology.   There was mild diverticulosis noted in the left colon. Retroflexed views revealed no abnormalities. The time to cecum = 7.0 Withdrawal time = 20.9   The scope was withdrawn and the procedure completed. COMPLICATIONS: There were no immediate complications.  ENDOSCOPIC IMPRESSION: 1.   Three sessile polyps ranging from 3 to 682min size were found in the sigmoid colon and transverse colon; polypectomies were performed with cold forceps and with a cold snare 2.   The colonic mucosa appeared normal throughout the entire examined colon; multiple random biopsies were performed using cold forceps - Ulcerative colitis in remission 3.   Mild diverticulosis was noted in the left colon  RECOMMENDATIONS: Await pathology results  continue Lialda  eSigned:  CaGatha MayerMD, FASaint Joseph East1/11/2014 3:52 PM  cc: The Patient

## 2015-07-07 NOTE — Progress Notes (Signed)
Called to room to assist during endoscopic procedure.  Patient ID and intended procedure confirmed with present staff. Received instructions for my participation in the procedure from the performing physician.  

## 2015-07-07 NOTE — Progress Notes (Signed)
A/ox3 pleased with MAC, report to Jane RN 

## 2015-07-07 NOTE — Patient Instructions (Addendum)
I found and removed 3 small polyps that look benign. I took biopsies but the colitis appears to be in remission.  I will let you know pathology results and when to have another routine colonoscopy by mail.  I appreciate the opportunity to care for you. Gatha Mayer, MD, FACG  YOU HAD AN ENDOSCOPIC PROCEDURE TODAY AT [facility]:   Refer to the procedure report that was given to you for any specific questions about what was found during the examination.  If the procedure report does not answer your questions, please call your gastroenterologist to clarify.  If you requested that your care partner not be given the details of your procedure findings, then the procedure report has been included in a sealed envelope for you to review at your convenience later.  YOU SHOULD EXPECT: Some feelings of bloating in the abdomen. Passage of more gas than usual.  Walking can help get rid of the air that was put into your GI tract during the procedure and reduce the bloating. If you had a lower endoscopy (such as a colonoscopy or flexible sigmoidoscopy) you may notice spotting of blood in your stool or on the toilet paper. If you underwent a bowel prep for your procedure, you may not have a normal bowel movement for a few days.  Please Note:  You might notice some irritation and congestion in your nose or some drainage.  This is from the oxygen used during your procedure.  There is no need for concern and it should clear up in a day or so.  SYMPTOMS TO REPORT IMMEDIATELY:   Following lower endoscopy (colonoscopy or flexible sigmoidoscopy):  Excessive amounts of blood in the stool  Significant tenderness or worsening of abdominal pains  Swelling of the abdomen that is new, acute  Fever of 100F or higher   Following upper endoscopy (EGD)  Vomiting of blood or coffee ground material  New chest pain or pain under the shoulder blades  Painful or persistently difficult  swallowing  New shortness of breath  Fever of 100F or higher  Black, tarry-looking stools  For urgent or emergent issues, a gastroenterologist can be reached at any hour by calling [phone].   DIET: Your first meal following the procedure should be a small meal and then it is ok to progress to your normal diet. Heavy or fried foods are harder to digest and may make you feel nauseous or bloated.  Likewise, meals heavy in dairy and vegetables can increase bloating.  Drink plenty of fluids but you should avoid alcoholic beverages for 24 hours.  ACTIVITY:  You should plan to take it easy for the rest of today and you should NOT DRIVE or use heavy machinery until tomorrow (because of the sedation medicines used during the test).    FOLLOW UP: Our staff will call the number listed on your records the next business day following your procedure to check on you and address any questions or concerns that you may have regarding the information given to you following your procedure. If we do not reach you, we will leave a message.  However, if you are feeling well and you are not experiencing any problems, there is no need to return our call.  We will assume that you have returned to your regular daily activities without incident.  If any biopsies were taken you will be contacted by phone or by letter within the next 1-3 weeks.  Please call us at [phone] if you have not heard about the biopsies in 3 weeks.    SIGNATURES/CONFIDENTIALITY: You and/or your care partner have signed paperwork which will be entered into your electronic medical record.  These signatures attest to the fact that that the information above on your After Visit Summary has been reviewed and is understood.  Full responsibility of the confidentiality of this discharge information lies with you and/or your care-partner.   Polyp and diverticulosis information given.  Continue Lialda.

## 2015-07-08 ENCOUNTER — Telehealth: Payer: Self-pay | Admitting: *Deleted

## 2015-07-08 NOTE — Telephone Encounter (Signed)
  Follow up Call-  Call back number 07/07/2015 11/06/2012  Post procedure Call Back phone  # [phone]  Permission to leave phone message Yes Yes     Patient questions:  Do you have a fever, pain , or abdominal swelling? No. Pain Score  0 *  Have you tolerated food without any problems? Yes.    Have you been able to return to your normal activities? Yes.    Do you have any questions about your discharge instructions: Diet   No. Medications  No. Follow up visit  No.  Do you have questions or concerns about your Care? No.  Actions: * If pain score is 4 or above: No action needed, pain <4.

## 2015-07-12 ENCOUNTER — Encounter: Payer: Self-pay | Admitting: Internal Medicine

## 2015-07-12 DIAGNOSIS — Z86010 Personal history of colonic polyps: Secondary | ICD-10-CM

## 2015-07-12 DIAGNOSIS — K5100 Ulcerative (chronic) pancolitis without complications: Secondary | ICD-10-CM

## 2015-07-12 NOTE — Progress Notes (Signed)
Quick Note:  UC in complete remission ssp and tub adenoma Repeat colonoscopy 3 years OV 1 year ______

## 2015-07-13 ENCOUNTER — Ambulatory Visit: Payer: BLUE CROSS/BLUE SHIELD | Admitting: Internal Medicine

## 2015-07-13 LAB — HM COLONOSCOPY

## 2015-07-13 NOTE — Addendum Note (Signed)
Addended by: Janith Lima on: 07/13/2015 12:53 PM   Modules accepted: Miquel Dunn

## 2015-07-18 ENCOUNTER — Other Ambulatory Visit: Payer: Self-pay | Admitting: Internal Medicine

## 2015-07-18 ENCOUNTER — Telehealth: Payer: Self-pay | Admitting: Internal Medicine

## 2015-07-18 DIAGNOSIS — R972 Elevated prostate specific antigen [PSA]: Secondary | ICD-10-CM | POA: Insufficient documentation

## 2015-07-18 NOTE — Telephone Encounter (Signed)
Pt wanted to know if you could look at Dr. Carlean Purl note since he went to him last week for prostate check and other things and advise if he still needs to have it rechecked. Please advise

## 2015-07-18 NOTE — Telephone Encounter (Signed)
LMOVM informing pt

## 2015-07-18 NOTE — Telephone Encounter (Signed)
The PSA needs to be checked again

## 2015-07-18 NOTE — Telephone Encounter (Signed)
[name] - please ask Pritesh to come in and go down to the lab to have his PSA level rechecked, I have already ordered it

## 2015-08-22 ENCOUNTER — Encounter: Payer: Self-pay | Admitting: Internal Medicine

## 2015-08-22 ENCOUNTER — Telehealth: Payer: Self-pay | Admitting: Internal Medicine

## 2015-08-22 NOTE — Telephone Encounter (Signed)
-----   Message from Janith Lima, MD sent at 03/17/2015  3:06 PM EDT ----- Regarding: PSA Recheck his PSA

## 2015-08-26 ENCOUNTER — Other Ambulatory Visit: Payer: Self-pay | Admitting: Internal Medicine

## 2015-09-16 ENCOUNTER — Telehealth: Payer: Self-pay | Admitting: Internal Medicine

## 2015-09-16 NOTE — Telephone Encounter (Signed)
Spoke with the pharmacy [facility]) and no prior authorization needed for his Lialda, its just too early to pick up and he is going to a facility and needs 30 days worth now.  Sonia Baller said they can run it thru on 09/18/15 and maybe someone could pick them up and take them to him.  I checked our sample closet and we just got some samples in today.  He said he will come by later today to pick them up, he was very appreciative.

## 2015-09-19 ENCOUNTER — Telehealth: Payer: Self-pay

## 2015-09-19 NOTE — Telephone Encounter (Signed)
Called [facility] # [phone] to do a prior authorization on Lialda per the drug store request.  They are unable to find the patient in their system under his member # [id number] , BD, or name.  He is currently in a treatment facility so unable to reach him.  I gave him samples of Lialda last week so the pharmacy is going to wait for him to contact them when he needs a refill and they will see if perhaps he has a new insurance information.

## 2015-10-06 ENCOUNTER — Other Ambulatory Visit: Payer: Self-pay | Admitting: Internal Medicine

## 2015-10-06 DIAGNOSIS — R972 Elevated prostate specific antigen [PSA]: Secondary | ICD-10-CM

## 2015-10-10 ENCOUNTER — Encounter: Payer: Self-pay | Admitting: Internal Medicine

## 2015-10-14 ENCOUNTER — Encounter: Payer: Self-pay | Admitting: Internal Medicine

## 2015-11-07 ENCOUNTER — Other Ambulatory Visit (HOSPITAL_COMMUNITY): Payer: BLUE CROSS/BLUE SHIELD | Attending: Medical | Admitting: Licensed Clinical Social Worker

## 2015-11-07 DIAGNOSIS — E7800 Pure hypercholesterolemia, unspecified: Secondary | ICD-10-CM | POA: Diagnosis not present

## 2015-11-07 DIAGNOSIS — F1020 Alcohol dependence, uncomplicated: Secondary | ICD-10-CM | POA: Insufficient documentation

## 2015-11-07 DIAGNOSIS — F4312 Post-traumatic stress disorder, chronic: Secondary | ICD-10-CM | POA: Diagnosis not present

## 2015-11-07 DIAGNOSIS — F3181 Bipolar II disorder: Secondary | ICD-10-CM | POA: Insufficient documentation

## 2015-11-08 ENCOUNTER — Encounter (HOSPITAL_COMMUNITY): Payer: Self-pay | Admitting: Psychology

## 2015-11-08 NOTE — Progress Notes (Signed)
[name] is a 60 y.o. male patient.  CD-IOP: Treatment Planning Session. I met with the patient as scheduled this afternoon. He just started the program yesterday and was very open and candid about his alcohol use and the many negative consequences that have occurred throughout his life due to his drinking. He had just left the dermatologist's office and the specialist had removed a cyst above his right eye. He received 3 stitches, but reported he felt fine. The importance of identifying goals for treatment was discussed and the patient confirmed that staying sober was his primary goal of treatment. He also agreed that having a supportive network within the [facility] of [facility] was essential. When asked about other treatment goals, the patient admitted that he is feeling tremendous financial stress and just found out that a large annuity that he was hoping to tap into is not available. He may have to report bankruptcy. The patient is going to meet with someone in the Fellowship who was a Music therapist and evaluate what he might be able to do. The patient shared a little more about his history and we spent time discussing his father and their relationship over the years along with his career working for the [employer]. He is very bitter and resentful towards both is father and previous employer. We talked about his pattern of going out to dinner at a sports bar, eating a meal and drinking wine. The patient admitted that this is one of the behaviors that have caused him so much debt. The treatment plan was reviewed, signed and completed accordingly. I will continue to meet with this fellow weekly for individual counseling sessions. He responded well to this first individual counseling session and his sobriety date is 3/5.        Minnie Shi, LCAS

## 2015-11-09 ENCOUNTER — Other Ambulatory Visit (HOSPITAL_COMMUNITY): Payer: Self-pay | Admitting: Medical

## 2015-11-09 ENCOUNTER — Other Ambulatory Visit (HOSPITAL_COMMUNITY): Payer: BLUE CROSS/BLUE SHIELD | Admitting: Psychology

## 2015-11-09 ENCOUNTER — Encounter (HOSPITAL_COMMUNITY): Payer: Self-pay | Admitting: Licensed Clinical Social Worker

## 2015-11-09 DIAGNOSIS — Z8659 Personal history of other mental and behavioral disorders: Secondary | ICD-10-CM

## 2015-11-09 DIAGNOSIS — K5100 Ulcerative (chronic) pancolitis without complications: Secondary | ICD-10-CM

## 2015-11-09 DIAGNOSIS — F4312 Post-traumatic stress disorder, chronic: Secondary | ICD-10-CM

## 2015-11-09 DIAGNOSIS — R972 Elevated prostate specific antigen [PSA]: Secondary | ICD-10-CM

## 2015-11-09 DIAGNOSIS — F1020 Alcohol dependence, uncomplicated: Secondary | ICD-10-CM
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
## 2015-11-09 NOTE — Progress Notes (Signed)
    Daily Group Progress Note  Program: CD-IOP   Group Time: 1-2:30  Participation Level: Active  Behavioral Response: Appropriate and Sharing  Type of Therapy: Process Group  Topic: The first part of group was spent in process where group members shared about obstacles, challenges and recovery-related activities since the last group. There was good disclosure and feedback among group members. Group members also shared any urges or cravings they had experienced. One group member reported she drank Saturday. The relapse was mapped out: trigger>thought>craving>use. One group member met with the program director, Deedra Ehrich, PA-C. Drug test results were returned. Two new members joined the group today and introduced themselves. The second part of group focused on mindfulness. A yoga instructor from [facility] taught chair yoga. One group member left at the end of yoga to go do a doctor's appointment. While practicing yoga, the instructor asked the group to focus on being present. Some members of the group had practiced yoga previously and some were new to yoga. Everyone in the group was focused on mindfulness. Group members reported they liked the yoga and mindfulness session.     Group Time:2:45-4  Participation Level: Active  Behavioral Response: Appropriate  Type of Therapy: Psycho-education Group  Topic: The second part of group focused on mindfulness. A yoga instructor from [facility] taught chair yoga. One group member left at the end of yoga to go do a doctor's appointment. While practicing yoga, the instructor asked the group to focus on being present. Some members of the group had practiced yoga previously and some were new to yoga. Everyone in the group was focused on mindfulness. Group members reported they liked the yoga and mindfulness session.     Summary: Today is patient's first day in treatment. Pt introduced himself to the group giving some history of his alcohol use. Pt reported he  had almost 2 months clean and drank Saturday night. He reported he didn't know why he drank. Pt reports he has lots of stressors in his life. Pt reports he goes to meetings and has a sponsor. Pt reported he had never participated in yoga previously but enjoyed the activity. Pt's sobriety date is now 3/5.   Family Program: Family present? No   Name of family member(s):   UDS collected: No Results:   AA/NA attended?: No  Sponsor?: No   MACKENZIE,LISBETH S, Licensed Cli
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
## 2015-11-10 ENCOUNTER — Encounter (HOSPITAL_COMMUNITY): Payer: Self-pay | Admitting: Psychology

## 2015-11-10 ENCOUNTER — Other Ambulatory Visit (HOSPITAL_COMMUNITY): Payer: BLUE CROSS/BLUE SHIELD | Admitting: Psychology

## 2015-11-10 DIAGNOSIS — F1020 Alcohol dependence, uncomplicated: Secondary | ICD-10-CM | POA: Diagnosis not present

## 2015-11-10 DIAGNOSIS — Z8659 Personal history of other mental and behavioral disorders: Secondary | ICD-10-CM

## 2015-11-10 DIAGNOSIS — F4312 Post-traumatic stress disorder, chronic: Secondary | ICD-10-CM

## 2015-11-11 LAB — PRESCRIPTION ABUSE MONITORING 17P, URINE
6-Acetylmorphine, Urine: NEGATIVE ng/mL
AMPHETAMINE SCREEN URINE: NEGATIVE ng/mL
BARBITURATE SCREEN URINE: NEGATIVE ng/mL
BENZODIAZEPINE SCREEN, URINE: NEGATIVE ng/mL
Buprenorphine, Urine: NEGATIVE ng/mL
CANNABINOIDS UR QL SCN: NEGATIVE ng/mL
Carisoprodol/Meprobamate, Ur: NEGATIVE ng/mL
Cocaine (Metab) Scrn, Ur: NEGATIVE ng/mL
Creatinine(Crt), U: 99.6 mg/dL (ref 20.0–300.0)
EDDP, Urine: NEGATIVE ng/mL
Fentanyl, Urine: NEGATIVE pg/mL
MDMA SCREEN, URINE: NEGATIVE ng/mL
METHADONE SCREEN, URINE: NEGATIVE ng/mL
Meperidine Screen, Urine: NEGATIVE ng/mL
Nitrite Urine, Quantitative: NEGATIVE ug/mL
OPIATE SCREEN URINE: NEGATIVE ng/mL
OXYCODONE+OXYMORPHONE UR QL SCN: NEGATIVE ng/mL
PH UR, DRUG SCRN: 6 (ref 4.5–8.9)
PROPOXYPHENE SCREEN URINE: NEGATIVE ng/mL
Phencyclidine Qn, Ur: NEGATIVE ng/mL
SPECIFIC GRAVITY: 1.022
TAPENTADOL, URINE: NEGATIVE ng/mL
Tramadol Screen, Urine: NEGATIVE ng/mL

## 2015-11-11 LAB — ETHYL GLUCURONIDE, URINE: ETHYL GLUCURONIDE SCREEN, UR: NEGATIVE ng/mL

## 2015-11-13 ENCOUNTER — Encounter (HOSPITAL_COMMUNITY): Payer: Self-pay | Admitting: Psychology

## 2015-11-13 NOTE — Progress Notes (Signed)
    Daily Group Progress Note  Program: CD-IOP   Group Time: 1-2:30 pm  Participation Level: Active  Behavioral Response: Sharing  Type of Therapy: Process Group  Topic: Process: the first part of group was spent in process. One member relapsed last night and the relapse was discussed at length. He had shared that he was struggling with cravings during the group session yesterday and many different recommendations and considerable encouragement had been offered. Another member was challenged about his own sobriety when he was sharing about his behaviors since we last met. He finally admitted he had drunk a 12-pack last night. His relapse was also examined at length and good ideas about reducing his job-stress were offered. There was good feedback and support offered among group members.   Group Time: 2:45- 4pm  Participation Level: Active  Behavioral Response: Appropriate  Type of Therapy: Psycho-education Group  Topic: Psycho-Ed: Forgiveness. The second half of group was spent in a psycho-ed on forgiveness. Previous sessions had focused on resentments and this was the natural Segway into letting go of those resentments. Handouts were provided and members were instructed to write 3 different letters on a resentment they are holding onto whether it is an individual, institution or group. The 3 topics were to include: one expressing one's feelings, the second a letter from the object of resentment saying everything you had wanted to hear and finally, the third is a brief acknowledgement of the second letter. Many members struggled with this assignment while others seemed relieved. Members were invited to share the specifics and to identify what the difficulties were in this assignment.    Summary: The patient reported he had attended one [place] meeting since the group session yesterday. He had also expressed concerns about the young group member who had relapsed and agreed that having cash in  one's pocket is a big trigger for most addicts. He had a question about 'Honesty' and wondered if telling the truth, especially if it would cause consequences that could hurt someone, was worth it? Members offered comments on this question, but ultimately, he admitted he knew he had to be fully honest. During the psycho-ed, the patient admitted it was difficult. He had been writing to his father and he is very angry and resentful towards him. The patient admitted it was 'impossible to imagine' that his father could provide a response that he would want to hear, as the second letter was to include. He challenged the reality of this exercise, but it was pointed out that at least 2 group members experienced a degree of peace that it was palpable for them. Perhaps by doing this exercise and writing these letters numerous times, he will experience a real degree of forgiveness. As the group neared the end of the session, the patient reported that this weekend could be challenging. He has a golf game scheduled and of course, [employer] which is a huge source of resentment and anger. The patient described his plans for the weekend and how he intended to remain sober. He responded well to this intervention and his sobriety date is 3/5.   Family Program: Family present? No   Name of family member(s):   UDS collected: No Results:  AA/NA attended?: YesThursday  Sponsor?: Yes   Stuart Guillen, LCAS

## 2015-11-14 ENCOUNTER — Other Ambulatory Visit (HOSPITAL_COMMUNITY): Payer: BLUE CROSS/BLUE SHIELD

## 2015-11-14 ENCOUNTER — Other Ambulatory Visit (HOSPITAL_COMMUNITY): Payer: Self-pay | Admitting: Medical

## 2015-11-14 DIAGNOSIS — F1020 Alcohol dependence, uncomplicated: Secondary | ICD-10-CM

## 2015-11-14 MED ORDER — ACAMPROSATE CALCIUM 333 MG PO TBEC: 666.0000 mg | DELAYED_RELEASE_TABLET | Freq: Three times a day (TID) | ORAL | Status: DC

## 2015-11-14 MED ORDER — NALTREXONE HCL 50 MG PO TABS: 50.0000 mg | ORAL_TABLET | Freq: Every day | ORAL | Status: DC

## 2015-11-16 ENCOUNTER — Other Ambulatory Visit (HOSPITAL_COMMUNITY): Payer: Self-pay | Admitting: Medical

## 2015-11-16 ENCOUNTER — Other Ambulatory Visit (HOSPITAL_COMMUNITY): Payer: BLUE CROSS/BLUE SHIELD | Admitting: Psychology

## 2015-11-16 DIAGNOSIS — F4312 Post-traumatic stress disorder, chronic: Secondary | ICD-10-CM

## 2015-11-16 DIAGNOSIS — F1020 Alcohol dependence, uncomplicated: Secondary | ICD-10-CM

## 2015-11-16 DIAGNOSIS — Z8659 Personal history of other mental and behavioral disorders: Secondary | ICD-10-CM

## 2015-11-17 ENCOUNTER — Encounter (HOSPITAL_COMMUNITY): Payer: Self-pay | Admitting: Psychology

## 2015-11-17 ENCOUNTER — Other Ambulatory Visit (HOSPITAL_COMMUNITY): Payer: BLUE CROSS/BLUE SHIELD

## 2015-11-17 NOTE — Progress Notes (Signed)
    Daily Group Progress Note  Program: CD-IOP   Group Time: 1-2:30 pm  Participation Level: Active  Behavioral Response: Appropriate and Sharing  Type of Therapy: Process Group  Topic: Process: the first half of group was spent in process. Two members had been discharged since the last group session and the members were clearly disheartened by the news. Process included discussing what one had done to support his/her recovery since the last session. Also discussed were challenges or temptations, including any resulting thoughts or cravings. Drug tests were collected from 3 group members.  Group Time: 2:45- 4pm  Participation Level: Active  Behavioral Response: Sharing  Type of Therapy: Psycho-education Group  Topic: "The Serenity Prayer": the second half of group was spent in a psycho-ed. Members were provided with a handout on the Serenity Prayer. Group members were asked to identify 5 things they could not change and 5 things they could change. Members shared their findings and a discussion ensued about how to address these issues. Despite the inability to change the past, members all identified the 'past' as being something they think about. During group, the Investment banker, operational met with a member who was graduating tomorrow and at least 3 others about med management.   Summary: The patient was engaged and attentive in group today. He apologized for having missed group on Monday, but explained he had needed to buy a car and spent the entire day looking. He reported he finally found something that was well-priced and a good buy. The patient admitted that he had felt very pleased at having gotten a good deal and, "I wanted to celebrate". He reported he stopped himself by thinking about his recent DWI and the court date and what he would have to say about his last drink. This proved to be a deterrent for him this time. Since he was last here, the patient reported he had attended 6-7 AA  meetings and met with his sponsor twice. When asked about Step work, the patient reported he and his sponsor had decided he should begin back at Step 1. In the psycho-ed, the patient identified the past as the most significant thing that he cannot change, but noted he could change and 'become responsible" for everything. He has been very irresponsible for years and reminded the group of his financial struggles. The patient admitted that sometimes, "I don't feel powerless" and pointed out how he has sometimes had just a glass or two of wine when he goes out for dinner. These are the times that suggest he can control how much he drinks. Another member noted that if he were to have a glass or two of wine, it wouldn't be long until he was up to a half gallon of vodka per day. The patient provided good feedback during the session and suggested the male group member try out the [employer] 10 am AA meeting held M-F. "Almost 75% of the meeting consists of women and they are a very strong group". The patient responded well to this intervention and his sobriety date remains 3/5.   Family Program: Family present? No   Name of family member(s):   UDS collected: Yes Results:pending  AA/NA attended?: YesMonday, Tuesday, Wednesday, Friday, Saturday and Sunday  Sponsor?: Yes   Briarrose Shor, LCAS

## 2015-11-18 ENCOUNTER — Telehealth (HOSPITAL_COMMUNITY): Payer: Self-pay | Admitting: Psychology

## 2015-11-18 LAB — PRESCRIPTION ABUSE MONITORING 17P, URINE
6-ACETYLMORPHINE, URINE: NEGATIVE ng/mL
AMPHETAMINE SCREEN URINE: NEGATIVE ng/mL
BARBITURATE SCREEN URINE: NEGATIVE ng/mL
BENZODIAZEPINE SCREEN, URINE: NEGATIVE ng/mL
BUPRENORPHINE, URINE: NEGATIVE ng/mL
CANNABINOIDS UR QL SCN: NEGATIVE ng/mL
CARISOPRODOL/MEPROBAMATE, UR: NEGATIVE ng/mL
COCAINE(METAB.)SCREEN, URINE: NEGATIVE ng/mL
CREATININE(CRT), U: 67.9 mg/dL (ref 20.0–300.0)
EDDP, Urine: NEGATIVE ng/mL
Fentanyl, Urine: NEGATIVE pg/mL
MDMA SCREEN, URINE: NEGATIVE ng/mL
Meperidine Screen, Urine: NEGATIVE ng/mL
Methadone Screen, Urine: NEGATIVE ng/mL
Nitrite Urine, Quantitative: NEGATIVE ug/mL
OXYCODONE+OXYMORPHONE UR QL SCN: NEGATIVE ng/mL
Opiate Scrn, Ur: NEGATIVE ng/mL
PHENCYCLIDINE QUANTITATIVE URINE: NEGATIVE ng/mL
Ph of Urine: 6.1 (ref 4.5–8.9)
Propoxyphene Scrn, Ur: NEGATIVE ng/mL
SPECIFIC GRAVITY: 1.018
Tapentadol, Urine: NEGATIVE ng/mL
Tramadol Screen, Urine: NEGATIVE ng/mL

## 2015-11-18 LAB — ETHYL GLUCURONIDE, URINE: Ethyl Glucuronide Screen, Ur: NEGATIVE ng/mL

## 2015-11-21 ENCOUNTER — Encounter (HOSPITAL_COMMUNITY): Payer: Self-pay | Admitting: Medical

## 2015-11-21 ENCOUNTER — Other Ambulatory Visit (HOSPITAL_COMMUNITY): Payer: BLUE CROSS/BLUE SHIELD | Admitting: Medical

## 2015-11-21 ENCOUNTER — Other Ambulatory Visit (HOSPITAL_COMMUNITY): Payer: Self-pay | Admitting: Medical

## 2015-11-21 DIAGNOSIS — F1020 Alcohol dependence, uncomplicated: Secondary | ICD-10-CM

## 2015-11-21 DIAGNOSIS — F1010 Alcohol abuse, uncomplicated: Secondary | ICD-10-CM

## 2015-11-21 DIAGNOSIS — F4312 Post-traumatic stress disorder, chronic: Secondary | ICD-10-CM
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
## 2015-11-23 ENCOUNTER — Other Ambulatory Visit (HOSPITAL_COMMUNITY): Payer: BLUE CROSS/BLUE SHIELD | Admitting: Psychology

## 2015-11-23 DIAGNOSIS — F4312 Post-traumatic stress disorder, chronic: Secondary | ICD-10-CM

## 2015-11-23 DIAGNOSIS — F1020 Alcohol dependence, uncomplicated: Secondary | ICD-10-CM | POA: Diagnosis not present

## 2015-11-23 DIAGNOSIS — Z8659 Personal history of other mental and behavioral disorders: Secondary | ICD-10-CM

## 2015-11-24 ENCOUNTER — Other Ambulatory Visit (HOSPITAL_COMMUNITY): Payer: BLUE CROSS/BLUE SHIELD | Admitting: Psychology

## 2015-11-24 DIAGNOSIS — F1020 Alcohol dependence, uncomplicated: Secondary | ICD-10-CM | POA: Diagnosis not present

## 2015-11-24 DIAGNOSIS — F4312 Post-traumatic stress disorder, chronic: Secondary | ICD-10-CM

## 2015-11-24 DIAGNOSIS — Z8659 Personal history of other mental and behavioral disorders: Secondary | ICD-10-CM

## 2015-11-24 LAB — PRESCRIPTION ABUSE MONITORING 17P, URINE
6-Acetylmorphine, Urine: NEGATIVE ng/mL
Amphetamine Scrn, Ur: NEGATIVE ng/mL
BARBITURATE SCREEN URINE: NEGATIVE ng/mL
BENZODIAZEPINE SCREEN, URINE: NEGATIVE ng/mL
BUPRENORPHINE, URINE: NEGATIVE ng/mL
CANNABINOIDS UR QL SCN: NEGATIVE ng/mL
CARISOPRODOL/MEPROBAMATE, UR: NEGATIVE ng/mL
COCAINE(METAB.)SCREEN, URINE: NEGATIVE ng/mL
Creatinine(Crt), U: 246.4 mg/dL (ref 20.0–300.0)
EDDP, Urine: NEGATIVE ng/mL
FENTANYL, URINE: NEGATIVE pg/mL
MDMA Screen, Urine: NEGATIVE ng/mL
MEPERIDINE SCREEN, URINE: NEGATIVE ng/mL
Methadone Screen, Urine: NEGATIVE ng/mL
Nitrite Urine, Quantitative: NEGATIVE ug/mL
OXYCODONE+OXYMORPHONE UR QL SCN: NEGATIVE ng/mL
Opiate Scrn, Ur: NEGATIVE ng/mL
Ph of Urine: 5.7 (ref 4.5–8.9)
Phencyclidine Qn, Ur: NEGATIVE ng/mL
Propoxyphene Scrn, Ur: NEGATIVE ng/mL
SPECIFIC GRAVITY: 1.0315
TRAMADOL SCREEN, URINE: NEGATIVE ng/mL
Tapentadol, Urine: NEGATIVE ng/mL

## 2015-11-24 LAB — ETHYL GLUCURONIDE, URINE: ETHYL GLUCURONIDE SCREEN, UR: NEGATIVE ng/mL

## 2015-11-25 ENCOUNTER — Encounter (HOSPITAL_COMMUNITY): Payer: Self-pay | Admitting: Psychology
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
## 2015-11-27 ENCOUNTER — Encounter (HOSPITAL_COMMUNITY): Payer: Self-pay | Admitting: Psychology

## 2015-11-27 NOTE — Progress Notes (Signed)
Daily Group Progress Note  Program: CD-IOP   Group Time: 1-2:30 pm  Participation Level: Active  Behavioral Response: Appropriate and Sharing  Type of Therapy: Process Group  Topic: Process: the first part of group was spent in process. Members discussed the struggles they face in early recovery and other issues that have proven challenging for them. A number of group members shared that they had really struggled since the session yesterday with the Daniels. One member admitted he was very upset when he left and really just wanted to break down and cry. This was processed and he was applauded because he did not drink last night. There was good feedback and discussion among group members.   Group Time: 2:45- 4pm  Participation Level: Active  Behavioral Response: Appropriate and Sharing  Type of Therapy: Psycho-education Group  Topic: Psycho-ED/Graduation: the second half of group was spent in a psycho-ed identifying the Family Roles in an addicted or dysfunctional family system.  A handout was provided identifying the 5 most identified roles and group members took turns reading these descriptions and discussing them. Members identified what roles they might have taken on in their childhood and if they are continuing to carry on these roles. The psycho-ed ended with about 20 minutes remaining in the session. A graduation was held honoring a member who was successfully completing and graduating from the program today. The ceremony, coin passing and brownies were completed with kind words of encouragement and gratitude shared among members.    Summary: The patient reported heh had attended 1 AA meeting this morning at the 10 am Men's meeting. he had seen another group member there. The patient read from a list of observations and feelings he had experienced since our last session. He expressed frustration and noted he had tried to call and email his attorney numerous times, but they have  not returned his call. Another group member commented that 'they won't call you until right before your court date'. This made this group member furious and he insisted if they won't speak with him he will seek other representation. The patient admitted he could relate to the graduating member's discomfort about leaving because the accountability of this group is very good for him. He understands it would be strange without it. The patient reported this weekend could be a tough one. It is forecast to be a great weekend for golf and he realized that playing golf is a powerful trigger for his drinking. "I don't know if I can play right now and not use', the patient admitted. The patient also pointed out that he lives alone and is not in a relationship and he does not receive the validation that the other group members get from family members or S/O.  he was encouraged to seek other sources of encouragement and validation. During the psycho-ed, the patient reminded the group he didn't really have much of a family, but he was the hero for some because of his athletic talents. He was also good from an academic perspective. He shared kind words of gratitude and hope with the graduating member. The patient continues to disclose many of his struggles and this is good since he has few people to share with. The patient reported he is writing down his schedule to insure he is accountable and his time is filled, especially the high trigger time for him which it late afternoon. The patient has 'returned to use' 2 times since entering the program and would be  considered for discharge if he uses again. He responded well to this intervention and his sobriety date is 3/17.  [name]   Family Program: Family present? No   Name of family member(s):   UDS collected: No Results:   AA/NA attended?: YesThursday  Sponsor?: Yes   [name], LCAS

## 2015-11-28 ENCOUNTER — Other Ambulatory Visit (HOSPITAL_COMMUNITY): Payer: BLUE CROSS/BLUE SHIELD

## 2015-11-28 ENCOUNTER — Other Ambulatory Visit (HOSPITAL_COMMUNITY): Payer: Self-pay | Admitting: Medical

## 2015-11-28 DIAGNOSIS — F1020 Alcohol dependence, uncomplicated: Secondary | ICD-10-CM

## 2015-11-28 DIAGNOSIS — Z8659 Personal history of other mental and behavioral disorders: Secondary | ICD-10-CM

## 2015-11-30 ENCOUNTER — Other Ambulatory Visit (HOSPITAL_COMMUNITY): Payer: BLUE CROSS/BLUE SHIELD | Admitting: Psychology

## 2015-11-30 DIAGNOSIS — F1020 Alcohol dependence, uncomplicated: Secondary | ICD-10-CM | POA: Diagnosis not present

## 2015-12-01 ENCOUNTER — Other Ambulatory Visit (HOSPITAL_COMMUNITY): Payer: BLUE CROSS/BLUE SHIELD | Admitting: Psychology

## 2015-12-01 DIAGNOSIS — F4312 Post-traumatic stress disorder, chronic: Secondary | ICD-10-CM

## 2015-12-01 DIAGNOSIS — F1020 Alcohol dependence, uncomplicated: Secondary | ICD-10-CM | POA: Diagnosis not present

## 2015-12-01 DIAGNOSIS — Z8659 Personal history of other mental and behavioral disorders: Secondary | ICD-10-CM

## 2015-12-02 ENCOUNTER — Encounter (HOSPITAL_COMMUNITY): Payer: Self-pay | Admitting: Psychology

## 2015-12-02 NOTE — Progress Notes (Signed)
[name] is a 60 y.o. male patient. CD-IOP: Individual Counseling Session. I met with the patient this morning. He had run late, but called and explained he was so busy working on the paper for [employer] that he the time had slipped by. The patient reported he will go to the [place] meeting at 10 am when he is done here. We discussed what he has planned for the coming weekend. I pointed out he has drunk 2 of the last 3 weekends and really needs a viable plan that will keep him accountable. The only way I think he can do that is with the support and presence of others in recovery. "What about meeting with your sponsor around 3 pm on Saturday or Sunday", I asked him? Apparently, this had never occurred to this fellow and he agreed that this was a good idea. We discussed his resistance to 'doing the things he knows he should do' versus just talking about them. I explained that it is only in relation to others that he can be accountable at this point. He reported the intention to go to the 9:30 AA meeting tomorrow and telling the other men there about his struggles. The patient stated he would make a plan with somebody there at the meeting to insure he is in contact and not just floating around by himself later in the day. He agreed that he would not be alone at those most stressful times of the day. The patient has struggled with sobriety and I had him sign a behavioral contract today that verifies his understanding that the next use will result in discharge. He is very involved in the fellowship of AA and has a sponsor, but seems willing to reach out only after he has drunk. His 3rd goal relates to his financial problems and today the patient stated the latest word is that he might be able to transfer the annuity into another form and get some money out. Otherwise, he feels that bankruptcy is likely. The session ended with the patient determined to remain sober this weekend. If he does what he has agreed to do, he  most likely will return to group on Monday with the same sobriety date - 3/26.           Allayah Raineri, LCAS

## 2015-12-04 ENCOUNTER — Encounter (HOSPITAL_COMMUNITY): Payer: Self-pay | Admitting: Psychology

## 2015-12-04 NOTE — Progress Notes (Signed)
    Daily Group Progress Note  Program: CD-IOP   Group Time: 1-2:30 pm  Participation Level: Active  Behavioral Response: Appropriate and Sharing  Type of Therapy: Process Group  Topic: Process: The first half of group was spent in process. Members shared about any problems, challenges or issues that have appeared in early recovery. They are also asked to identify what they have done to support their recovery. All members had remained sober, per their report, since we last met yesterday afternoon.   Group Time: 2:45- 4 pm  Participation Level: Active  Behavioral Response: Sharing  Type of Therapy: Psycho-education Group  Topic: Psycho-ED: Identifying 'Cues' or triggers relative to 'Places, Gatherings, Things and Emotional States. A psycho-ed was presented in the second half of group. Members were provided handouts that required members to identify specific cues or triggers relative to certain settings. The handouts also included identifying strategies to implement in those circumstances to maintain one's sobriety. Near the end of the session, members shared about plans for the coming weekend and how they intended to return on Monday with the same sobriety date.   Summary: The patient reported he had attended the 10 am AA meeting this morning and spoken with his sponsor 3 times since the last group session. He went to the gym early this morning and spent much of the morning working on a time line detailing everything that happened before and during his return to use this past weekend. The patient reported he is feeling a little better today and noted that this is his pattern. He uses, feels depressed and guilty, but gradually feeling better. But then, he drinks again. The patient agreed that he must make some definitive plans to insure he doesn't return to use over the weekend. I pointed out that it is [facility] [employer] [employer] college basketball and this is a huge trigger for him. During the psycho-end, the  patient appeared to have no problem identifying cues in the 4 distinct categories listed. Under the category of things, the patient identified 'motorcycles' and announced that he plans to sell his motorcycle. It is too closely associated with his drinking. For this patient, the 'emotional states' appeared to be the most difficult for him. He identified ruminating on resentments and hypomania as 2 emotional states that are cues for him to use. As he has displayed in the past, the patient has a clear understanding of his triggers and the actions that should occur in response to them. Historically, though, he has failed to follow through on implementing those strategies. He responded well to this intervention and his sobriety date is 3/26.   Family Program: Family present? No   Name of family member(s):   UDS collected: No Results:   AA/NA attended?: YesThursday  Sponsor?: Yes   Gracelynne Benedict, LCAS

## 2015-12-05 ENCOUNTER — Other Ambulatory Visit (HOSPITAL_COMMUNITY): Payer: BLUE CROSS/BLUE SHIELD | Attending: Medical | Admitting: Psychology

## 2015-12-05 ENCOUNTER — Other Ambulatory Visit (HOSPITAL_COMMUNITY): Payer: Self-pay | Admitting: Medical

## 2015-12-05 ENCOUNTER — Encounter (HOSPITAL_COMMUNITY): Payer: Self-pay | Admitting: Medical

## 2015-12-05 ENCOUNTER — Telehealth: Payer: Self-pay

## 2015-12-05 DIAGNOSIS — F4312 Post-traumatic stress disorder, chronic: Secondary | ICD-10-CM | POA: Diagnosis not present

## 2015-12-05 DIAGNOSIS — F1020 Alcohol dependence, uncomplicated: Secondary | ICD-10-CM | POA: Insufficient documentation
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
## 2015-12-05 NOTE — Telephone Encounter (Signed)
Faxed the completed form to [employer] at fax # [phone] for Lialda 1.2 gram tabs, 2 tabs daily, dx: UC, 556.  Patient has been in remission on this rx for years, came to [place] in 2012 on it.

## 2015-12-06 ENCOUNTER — Encounter (HOSPITAL_COMMUNITY): Payer: Self-pay | Admitting: Psychology

## 2015-12-06 ENCOUNTER — Other Ambulatory Visit (HOSPITAL_COMMUNITY): Payer: Self-pay | Admitting: Psychology
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
## 2015-12-07 ENCOUNTER — Other Ambulatory Visit (HOSPITAL_COMMUNITY): Payer: BLUE CROSS/BLUE SHIELD | Admitting: Psychology

## 2015-12-07 DIAGNOSIS — F1020 Alcohol dependence, uncomplicated: Secondary | ICD-10-CM | POA: Diagnosis not present

## 2015-12-07 DIAGNOSIS — F4312 Post-traumatic stress disorder, chronic: Secondary | ICD-10-CM

## 2015-12-07 DIAGNOSIS — Z8659 Personal history of other mental and behavioral disorders: Secondary | ICD-10-CM

## 2015-12-07 LAB — PRESCRIPTION ABUSE MONITORING 17P, URINE
6-ACETYLMORPHINE, URINE: NEGATIVE ng/mL
AMPHETAMINE SCREEN URINE: NEGATIVE ng/mL
BARBITURATE SCREEN URINE: NEGATIVE ng/mL
BENZODIAZEPINE SCREEN, URINE: NEGATIVE ng/mL
Buprenorphine, Urine: NEGATIVE ng/mL
CANNABINOIDS UR QL SCN: NEGATIVE ng/mL
Carisoprodol/Meprobamate, Ur: NEGATIVE ng/mL
Cocaine (Metab) Scrn, Ur: NEGATIVE ng/mL
Creatinine(Crt), U: 125 mg/dL (ref 20.0–300.0)
EDDP, Urine: NEGATIVE ng/mL
Fentanyl, Urine: NEGATIVE pg/mL
MDMA SCREEN, URINE: NEGATIVE ng/mL
METHADONE SCREEN, URINE: NEGATIVE ng/mL
Meperidine Screen, Urine: NEGATIVE ng/mL
Nitrite Urine, Quantitative: NEGATIVE ug/mL
OPIATE SCREEN URINE: NEGATIVE ng/mL
OXYCODONE+OXYMORPHONE UR QL SCN: NEGATIVE ng/mL
PH UR, DRUG SCRN: 5.7 (ref 4.5–8.9)
PHENCYCLIDINE QUANTITATIVE URINE: NEGATIVE ng/mL
PROPOXYPHENE SCREEN URINE: NEGATIVE ng/mL
SPECIFIC GRAVITY: 1.018
TAPENTADOL, URINE: NEGATIVE ng/mL
Tramadol Screen, Urine: NEGATIVE ng/mL

## 2015-12-07 LAB — ETHYL GLUCURONIDE, URINE: ETHYL GLUCURONIDE SCREEN, UR: NEGATIVE ng/mL

## 2015-12-07 MED ORDER — MESALAMINE 400 MG PO CPDR: 800.0000 mg | DELAYED_RELEASE_CAPSULE | Freq: Three times a day (TID) | ORAL | Status: DC

## 2015-12-07 NOTE — Telephone Encounter (Signed)
I went ahead and sent rx to the pharmacy and will await his call to discuss how to take.

## 2015-12-07 NOTE — Telephone Encounter (Signed)
Left Batu a detailed voice mail message regarding new rx and told him to call me back to discuss.

## 2015-12-07 NOTE — Telephone Encounter (Signed)
Lialda denied.  Must try Delzicol unless we appeal. Please advise Sir.

## 2015-12-07 NOTE — Telephone Encounter (Signed)
Patient came in and we discussed his medicines.  He is going to pick up the Delzicol and I gave him some samples of Lialda in case it doesn't work.

## 2015-12-07 NOTE — Telephone Encounter (Signed)
OK - Delzicol 400 mg 2 tablets tid # 180 [id number] RF  Note he can take it 3 bid but we need to Rx it as above

## 2015-12-08 ENCOUNTER — Encounter (HOSPITAL_COMMUNITY): Payer: Self-pay | Admitting: Psychology

## 2015-12-08 ENCOUNTER — Telehealth: Payer: Self-pay

## 2015-12-08 ENCOUNTER — Other Ambulatory Visit (HOSPITAL_COMMUNITY): Payer: BLUE CROSS/BLUE SHIELD | Admitting: Psychology

## 2015-12-08 DIAGNOSIS — Z8659 Personal history of other mental and behavioral disorders: Secondary | ICD-10-CM

## 2015-12-08 DIAGNOSIS — F4312 Post-traumatic stress disorder, chronic: Secondary | ICD-10-CM

## 2015-12-08 DIAGNOSIS — F1020 Alcohol dependence, uncomplicated: Secondary | ICD-10-CM | POA: Diagnosis not present

## 2015-12-08 MED ORDER — MESALAMINE 400 MG PO CPDR: 800.0000 mg | DELAYED_RELEASE_CAPSULE | Freq: Three times a day (TID) | ORAL | Status: DC

## 2015-12-08 NOTE — Telephone Encounter (Signed)
Nikola called in and said that the pharmacy cannot get the asacol which has been discontinued.  I called the [facility] and verbally gave them the ok to switch to delzicol which is what insurance covers.

## 2015-12-08 NOTE — Progress Notes (Signed)
    Daily Group Progress Note  Program: CD-IOP   Group Time: 1-2:30  Participation Level: Active  Behavioral Response: Appropriate and Sharing  Type of Therapy: Process Group  Topic: Counselors helped guide patients in a discussion on their recovery from drugs and alcohol. Patients shared openly about their struggles and successes. 1 patient relapsed since we last saw him.  Group Time: 2:45-4  Participation Level: Active  Behavioral Response: Appropriate and Sharing  Type of Therapy: Psycho-education Group  Topic: Counselors led a psycho-ed lesson on "high risk, low risk, no risk situations". Patients identified their different situations that cause harm or use of alcohol or drugs.  Summary: Patient is making good progress in his recovery but has shown signs of resistance or defensiveness to treatment from counselor. Patient often has excuses as to why certain methods or behavioral changes would not work for him. Counselor feels that he may believe himself to be "special" since he also frequently contacts counselor via phone or email outside of group time. Patient reported that since he relapsed last weekend he has "gotten back on the horse" and he attended 2 AA meetings. He reported stress from current difficulties with his past insurance claims when he was last in [facility]. He exercised and walked his dog to help reduce cravings or temptations to go to a bar. He also is doing research on a breathalyzer device for his car since he feels that could help. He reports that he talks to his sponsor daily. Patient's sobriety date is 3/26.   Family Program: Family present? No   Name of family member(s):   UDS collected: No Results:   AA/NA attended?: YesTuesday and Wednesday  Sponsor?: Yes     Mckinzey Entwistle, LCAS
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
## 2015-12-11 LAB — ETHYL GLUCURONIDE LC/MS/MS
ETHYL GLUCURONIDE LC/MS/MS: 885 ng/mL
ETHYL GLUCURONIDE: POSITIVE — AB
ETHYL SULFATE: POSITIVE — AB
EtG/EtS LC/MS/MS: POSITIVE — AB
Ethyl Sulfate LC/MS/MS: 535 ng/mL

## 2015-12-11 LAB — PRESCRIPTION ABUSE MONITORING 17P, URINE
6-ACETYLMORPHINE, URINE: NEGATIVE ng/mL
AMPHETAMINE SCREEN URINE: NEGATIVE ng/mL
BARBITURATE SCREEN URINE: NEGATIVE ng/mL
BENZODIAZEPINE SCREEN, URINE: NEGATIVE ng/mL
BUPRENORPHINE, URINE: NEGATIVE ng/mL
CANNABINOIDS UR QL SCN: NEGATIVE ng/mL
COCAINE(METAB.)SCREEN, URINE: NEGATIVE ng/mL
Carisoprodol/Meprobamate, Ur: NEGATIVE ng/mL
Creatinine(Crt), U: 40.4 mg/dL (ref 20.0–300.0)
EDDP, Urine: NEGATIVE ng/mL
FENTANYL, URINE: NEGATIVE pg/mL
MDMA SCREEN, URINE: NEGATIVE ng/mL
METHADONE SCREEN, URINE: NEGATIVE ng/mL
Meperidine Screen, Urine: NEGATIVE ng/mL
Nitrite Urine, Quantitative: NEGATIVE ug/mL
OPIATE SCREEN URINE: NEGATIVE ng/mL
OXYCODONE+OXYMORPHONE UR QL SCN: NEGATIVE ng/mL
PH UR, DRUG SCRN: 7.2 (ref 4.5–8.9)
PROPOXYPHENE SCREEN URINE: NEGATIVE ng/mL
Phencyclidine Qn, Ur: NEGATIVE ng/mL
SPECIFIC GRAVITY: 1.009
TAPENTADOL, URINE: NEGATIVE ng/mL
TRAMADOL SCREEN, URINE: NEGATIVE ng/mL

## 2015-12-11 LAB — ETHYL GLUCURONIDE, URINE

## 2015-12-12 ENCOUNTER — Encounter (HOSPITAL_COMMUNITY): Payer: Self-pay | Admitting: Psychology

## 2015-12-12 ENCOUNTER — Other Ambulatory Visit (HOSPITAL_COMMUNITY): Payer: BLUE CROSS/BLUE SHIELD | Attending: Psychiatry | Admitting: Psychology

## 2015-12-12 DIAGNOSIS — F4312 Post-traumatic stress disorder, chronic: Secondary | ICD-10-CM | POA: Diagnosis not present

## 2015-12-12 DIAGNOSIS — F1020 Alcohol dependence, uncomplicated: Secondary | ICD-10-CM | POA: Diagnosis present

## 2015-12-12 DIAGNOSIS — Z8659 Personal history of other mental and behavioral disorders: Secondary | ICD-10-CM
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
## 2015-12-13 ENCOUNTER — Other Ambulatory Visit (HOSPITAL_COMMUNITY): Payer: BLUE CROSS/BLUE SHIELD

## 2015-12-14 ENCOUNTER — Other Ambulatory Visit (HOSPITAL_COMMUNITY): Payer: BLUE CROSS/BLUE SHIELD

## 2015-12-14 DIAGNOSIS — F1020 Alcohol dependence, uncomplicated: Secondary | ICD-10-CM

## 2015-12-15 ENCOUNTER — Other Ambulatory Visit (HOSPITAL_COMMUNITY): Payer: BLUE CROSS/BLUE SHIELD | Admitting: Psychology

## 2015-12-15 ENCOUNTER — Encounter (HOSPITAL_COMMUNITY): Payer: Self-pay | Admitting: Psychology

## 2015-12-15 DIAGNOSIS — F1020 Alcohol dependence, uncomplicated: Secondary | ICD-10-CM

## 2015-12-15 DIAGNOSIS — F4312 Post-traumatic stress disorder, chronic: Secondary | ICD-10-CM

## 2015-12-15 DIAGNOSIS — Z8659 Personal history of other mental and behavioral disorders: Secondary | ICD-10-CM
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
## 2015-12-16 ENCOUNTER — Other Ambulatory Visit (HOSPITAL_COMMUNITY): Payer: BLUE CROSS/BLUE SHIELD

## 2015-12-19 ENCOUNTER — Other Ambulatory Visit (HOSPITAL_COMMUNITY): Payer: BLUE CROSS/BLUE SHIELD

## 2015-12-19 ENCOUNTER — Encounter (HOSPITAL_COMMUNITY): Payer: Self-pay | Admitting: Psychology
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
## 2015-12-19 NOTE — Progress Notes (Signed)
[name] is a 60 y.o. male patient. CD-IOP: Excused Absence. The patient contacted me about an opportunity to spend this afternoon with old friends. He explained these folks are like family to him and he would really appreciate the chance to spend time with them and be excused from the group session today. I agreed it would be of great benefit and we decided his absence will be excused. The patient is scheduled to meet with me tomorrow at 3 pm for his regular weekly individual counseling session.         Anah Billard, LCAS

## 2015-12-20 ENCOUNTER — Other Ambulatory Visit (HOSPITAL_COMMUNITY): Payer: BLUE CROSS/BLUE SHIELD

## 2015-12-21 ENCOUNTER — Other Ambulatory Visit (HOSPITAL_COMMUNITY): Payer: BLUE CROSS/BLUE SHIELD | Admitting: Psychology

## 2015-12-21 ENCOUNTER — Other Ambulatory Visit (HOSPITAL_COMMUNITY): Payer: Self-pay | Admitting: Medical

## 2015-12-21 DIAGNOSIS — F1020 Alcohol dependence, uncomplicated: Secondary | ICD-10-CM | POA: Diagnosis not present

## 2015-12-21 DIAGNOSIS — F4312 Post-traumatic stress disorder, chronic: Secondary | ICD-10-CM

## 2015-12-21 DIAGNOSIS — Z8659 Personal history of other mental and behavioral disorders: Secondary | ICD-10-CM

## 2015-12-22 ENCOUNTER — Other Ambulatory Visit (HOSPITAL_COMMUNITY): Payer: BLUE CROSS/BLUE SHIELD | Admitting: Psychology

## 2015-12-22 DIAGNOSIS — F4312 Post-traumatic stress disorder, chronic: Secondary | ICD-10-CM

## 2015-12-22 DIAGNOSIS — Z8659 Personal history of other mental and behavioral disorders: Secondary | ICD-10-CM

## 2015-12-22 DIAGNOSIS — F1020 Alcohol dependence, uncomplicated: Secondary | ICD-10-CM | POA: Diagnosis not present

## 2015-12-23 ENCOUNTER — Other Ambulatory Visit (HOSPITAL_COMMUNITY): Payer: BLUE CROSS/BLUE SHIELD

## 2015-12-23 LAB — PRESCRIPTION ABUSE MONITORING 17P, URINE
6-ACETYLMORPHINE, URINE: NEGATIVE ng/mL
AMPHETAMINE SCREEN URINE: NEGATIVE ng/mL
BARBITURATE SCREEN URINE: NEGATIVE ng/mL
BENZODIAZEPINE SCREEN, URINE: NEGATIVE ng/mL
Buprenorphine, Urine: NEGATIVE ng/mL
CANNABINOIDS UR QL SCN: NEGATIVE ng/mL
Carisoprodol/Meprobamate, Ur: NEGATIVE ng/mL
Cocaine (Metab) Scrn, Ur: NEGATIVE ng/mL
Creatinine(Crt), U: 59.4 mg/dL (ref 20.0–300.0)
EDDP, URINE: NEGATIVE ng/mL
FENTANYL, URINE: NEGATIVE pg/mL
MDMA SCREEN, URINE: NEGATIVE ng/mL
METHADONE SCREEN, URINE: NEGATIVE ng/mL
Meperidine Screen, Urine: NEGATIVE ng/mL
NITRITE URINE, QUANTITATIVE: NEGATIVE ug/mL
OPIATE SCREEN URINE: NEGATIVE ng/mL
OXYCODONE+OXYMORPHONE UR QL SCN: NEGATIVE ng/mL
PH UR, DRUG SCRN: 6.8 (ref 4.5–8.9)
PROPOXYPHENE SCREEN URINE: NEGATIVE ng/mL
Phencyclidine Qn, Ur: NEGATIVE ng/mL
SPECIFIC GRAVITY: 1.01
TAPENTADOL, URINE: NEGATIVE ng/mL
Tramadol Screen, Urine: NEGATIVE ng/mL

## 2015-12-23 LAB — ETHYL GLUCURONIDE, URINE: ETHYL GLUCURONIDE SCREEN, UR: NEGATIVE ng/mL

## 2015-12-25 ENCOUNTER — Encounter (HOSPITAL_COMMUNITY): Payer: Self-pay | Admitting: Psychology

## 2015-12-25 ENCOUNTER — Telehealth (HOSPITAL_COMMUNITY): Payer: Self-pay | Admitting: Psychology

## 2015-12-25 NOTE — Progress Notes (Signed)
    Daily Group Progress Note  Program: CD-IOP   Group Time: 1-2:30 pm  Participation Level: Active  Behavioral Response: Appropriate and Sharing  Type of Therapy: Process Group  Topic: Process: the first part of group was spent in process. Members shared about any issues or challenges that have appeared or any problems that have presented themselves in early recovery. Only one member had not attended a 12-Step meeting since our last group session yesterday. A member returned to group after having missed the last session due to his new work schedule. A drug test was collected from him.  Group Time: 2:45- 4pm  Participation Level: Active  Behavioral Response: Sharing, Rigid and Resistant  Type of Therapy: Psycho-education Group  Topic: Psycho-Ed: the second half of group was spent in a psycho-ed. A handout was provided addressing "SMART Goal-Setting Worksheet". It included the 5 elements that a good goal includes. The acronym for these 5 elements is SMART. Members were given time to identify one of their goals and identify the 5 elements or 5 specific ways they will be able to reach their goal. There was helpful feedback and discussion among members. One member stated he would use this SMART formula in his work. Members may be able to meet short and long-term goals ore effectively going forward if they understand the importance of setting goals and then measuring them.   Summary: The patient reported he had attended the 10 am AA meeting today and been to the gym one time as well. The patient reported he has been working on writing a letter to his father. "I actually have 4 letters", he reported. 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 He shared about his enthusiasm for riding his motorcycle as well as the 'beach volleyball' at the tennis and swim club he recently joined. The patient provided feedback to another member and noted that he has questioned the HP issue and considers himself an  agnostic. During the psycho-ed, the patient identified his goal as being, "to know my sponsor". The patient identified how he would reach this goal, but repeatedly insisted that he was pretty much at the mercy of his sponsor. He seemed to think that they both need to know each other very well before they address Step 4. It was pointed out that this patient doesn't need his sponsor to know him that well to complete Step 4. He was encouraged to look at this as primarily his responsibility, but he was very stubborn about accepting this notion. As is typical of him, the patient resists many recommendations or comments from others when it comes to doing things. He seems to think his way is always better than the rest. It seems that this resistance, which is very typical of him, is an overall efforts to avoid taking full responsibility . His sobriety date remains 3/26.   Family Program: Family present? No   Name of family member(s):   UDS collected: No Results:   AA/NA attended?: YesThursday  Sponsor?: Yes   Zilda No, LCAS

## 2015-12-25 NOTE — Progress Notes (Signed)
Daily Group Progress Note  Program: CD-IOP   Group Time: 1-2:30 pm  Participation Level: Active  Behavioral Response: Sharing  Type of Therapy: Process Group  Topic: Counselors met with patients for group process for 1.5 hours. Counselors used MBCBT and motivational interviewing to help patients explore their experiences in recovery. One new patient was late for group do to spontaneous car trouble. He did not meet with the program director due to his tardiness. Patients shared about their feelings and thoughts related to sobriety.  Group Time: 2:45- 4pm  Participation Level: Active  Behavioral Response: Sharing and Resistant  Type of Therapy: Psycho-education Group/Graduation  Topic: Counselors met with patients for group psychoeducation for 1.25 hours. Counselors led a creative exercise in which patients drew a recovery tree with new leaves representing new growth in their life, and old leaves which need to fade away. Patients spent time coloring and filling in their answers and were invited to share their trees in group. Ss the session neared the end, a graduation ceremony was held honoring a member who was successfully completing the program today. Brownies were eaten and the graduation medallion passed around the group with kind words and gratitude shared with the member leaving [place].   Summary: Patient presented as active and engaged in group today. He reported on his farewell gathering for his sponsor who is moving to [place]. Pt has a new sponsor already in place who he says is "like a Physiological scientist" but that that's what he wants.  He reports that he has cravings but that he is better able to let them pass rather than fixate on them. Patient discussed his ability to trust others which he admits, "is very difficult". When asked what made his old sponsor so effective he replied, "He didn't give up on me". Patient seems to have strong psychological defenses through deflecting,  using humor, and intellectualizing his painful feelings of resentment and fear. His avoidance of emotion was present in the graduation ceremony when he made jokes as he spoke to the graduating member. It is usually a serious occasion, but not for this member. The patient has remained sober for the past 3 weekends and believes he is doing well in the program. His sobriety date remains 3/26.   Family Program: Family present? No   Name of family member(s):   UDS collected: No Results:   AA/NA attended?: YesWednesday  Sponsor?: Yes   Cael Worth, LCAS

## 2015-12-26 ENCOUNTER — Other Ambulatory Visit (HOSPITAL_COMMUNITY): Payer: BLUE CROSS/BLUE SHIELD | Admitting: Medical

## 2015-12-26 ENCOUNTER — Encounter (HOSPITAL_COMMUNITY): Payer: Self-pay | Admitting: Psychology

## 2015-12-26 NOTE — Progress Notes (Signed)
[name] is a 60 y.o. male patient. CD-IOP: Discharge Unsuccessful. Patient appeared early this morning and reported he had drunk over the weekend. He had signed a behavioral contract after his 3rd 'return to use' on March 31st. The agreement had stated that he would be referred to a higher level of care should he return to use. The patient is referred to [facility]. He is unable to stop drinking at this current level of care. See discharge documentation scanned in Epic.         [name], LCAS

## 2015-12-27 ENCOUNTER — Other Ambulatory Visit (HOSPITAL_COMMUNITY): Payer: BLUE CROSS/BLUE SHIELD

## 2015-12-28 ENCOUNTER — Other Ambulatory Visit (HOSPITAL_COMMUNITY): Payer: BLUE CROSS/BLUE SHIELD

## 2015-12-29 ENCOUNTER — Other Ambulatory Visit (HOSPITAL_COMMUNITY): Payer: BLUE CROSS/BLUE SHIELD

## 2015-12-30 ENCOUNTER — Other Ambulatory Visit (HOSPITAL_COMMUNITY): Payer: BLUE CROSS/BLUE SHIELD

## 2016-01-02 ENCOUNTER — Other Ambulatory Visit (HOSPITAL_COMMUNITY): Payer: BLUE CROSS/BLUE SHIELD

## 2016-01-03 ENCOUNTER — Other Ambulatory Visit (HOSPITAL_COMMUNITY): Payer: BLUE CROSS/BLUE SHIELD

## 2016-01-04 ENCOUNTER — Other Ambulatory Visit (HOSPITAL_COMMUNITY): Payer: BLUE CROSS/BLUE SHIELD

## 2016-01-05 ENCOUNTER — Other Ambulatory Visit (HOSPITAL_COMMUNITY): Payer: BLUE CROSS/BLUE SHIELD

## 2016-01-06 ENCOUNTER — Other Ambulatory Visit (HOSPITAL_COMMUNITY): Payer: BLUE CROSS/BLUE SHIELD

## 2016-01-09 ENCOUNTER — Encounter (HOSPITAL_COMMUNITY): Payer: Self-pay

## 2016-01-09 ENCOUNTER — Other Ambulatory Visit (HOSPITAL_COMMUNITY): Payer: BLUE CROSS/BLUE SHIELD

## 2016-01-09 ENCOUNTER — Encounter (HOSPITAL_COMMUNITY): Payer: Self-pay | Admitting: Psychology

## 2016-01-09 NOTE — Progress Notes (Addendum)
    Daily Group Progress Note  Program: CD-IOP   Group Time: 1-2:30 pm  Participation Level: Active  Behavioral Response: Sharing  Type of Therapy: Process Group  Topic: Process: the first part of group was spent in process.  Members shared bout the past weekend and any challenges or temptations in early recovery.  They were invited to discuss any realizations or insights they had gleaned since the last session met. One member shared that he had drunk over the weekend. He was filled with guilt and remorse and shared opnely about the events leading up to the actual use. He received good feedback from his fellow group members. The group was small today with 3 members absent for various reasons. The program director met to discuss medications with at least one group member. Random drug tests were collected today.   Group Time: 2:45-4pm  Participation Level: Active  Behavioral Response: Sharing  Type of Therapy: Psycho-education Group  Topic: Psycho-Ed: The Wheel of Life. The second half of group was spent in a psycho-ed on the Wheel of Life. A handout was provided with a circle segmented into 8 different sections. Each of the 8 segments represented different parts of life. Members were asked to place their satisfaction with each segment onto the wheel. The satisfaction with each segment was identified by how far from the center or hub each segment was drawn. Members then drew their wheels on the board and explained them to the rest of the group. The session required more activity than others and members seemed more energized by getting up, drawing their wheels and then reporting to the group.   Summary: The patient reported a good weekend. He had played volleyball and rode his motorcycle and felt very free and unencumbered by some of the challenges that he faces. The patient laughed as he recounted how sore he had felt after playing volleyball - it had been quite a few years. The patient  attended 4 AA meetings and had spoken to his sponsor daily. He had also gone to the gym 3 times. The patient was energized and exuberant today. He seemed more 'alive' than we have seen him in previous sessions. During the psycho-ed, the patient presented his 'wheel' to the group. He explained how he had determined his 8 segments and his wheel was surprising balanced considering the many struggles and complications that he is dealing with right now. The patient responded well to this intervention and is making progress in his early recovery. His sobriety date remains 3/26.   Family Program: Family present? No   Name of family member(s):   UDS collected: No Results:   AA/NA attended?: Yes, Friday, Saturday,Sunday and Monday  Sponsor?: Yes   Rylyn Ranganathan, LCAS

## 2016-01-09 NOTE — Progress Notes (Unsigned)
    Daily Group Progress Note  Program: CD-IOP   Group Time: 1-2:30 pm  Participation Level: Active  Behavioral Response: Appropriate and Sharing  Type of Therapy: Process Group  Topic: The first half of group was spent in process. Two new group members were present. Members shared about the events over the past weekend and what actions they had taken to support their recovery. At least 3 group members had 'returned to use' since they last appeared for group. One group member felt tremendous remorse and the group spent time working with him and identifying strategies that could be employed should he be tempted again. Any suggestions of ambivalence were addressed using techniques from motivational interviewing. The program director met with one of the new members and drug tests were collected from 3 group members.   Group Time: 2:45- 4pm  Participation Level: Active  Behavioral Response: Sharing  Type of Therapy: Psycho-education Group  Topic: Topic: Process/Psycho-Ed: Values. The second half of group began with the new group members introducing themselves and sharing about their past and what they might need from this program. After they had been introduced and fielded questions from other members, a brief psycho-ed on the topic of 'Values' was provided. Members were invited to share some of their values and how their behaviors reflect those values. Members seemed confused and struggled to identify their values. This session came to an end before this topic could be explored fully. This subject will be continued in session at a later date.   Summary: The patent appeared and presented with a new sobriety date. He felt very badly about himself and described what had happened that led to his relapse. He had actually drunk on Friday and Saturday nights. He had driven home each time after 9-10 drinks. The patient admitted he hadn't told his sponsor or shared at the meetings that he attended on  Saturday and then on Sunday. "I have no excuses", he stated. The patient went on to describe the plans he has made to address the ways he had used and gone around his recovery net. He felt regret, but also decided he wouldn't beat himself up over these slips and would get back on track. There wasn't much time to discuss values, but the patient noted that in his individual session with this counselor, he had been shown how he doesn't really live out his values and therefore, they might not really been what he values. He might like to think he values these things, but he admitted that if he isn't living them out, perhaps he isn't really valuing them at all. There remains much to discuss regarding this topic. In the meantime, the patient was open and honest about his alcohol use over the weekend and his new sobriety date is 3/26.    Family Program: Family present? No   Name of family member(s):   UDS collected: No Results: no because group member admitted he relapsed over weekend  AA/NA attended?: YesMonday, Friday, Saturday and Sunday  Sponsor?: Yes   [facility]

## 2016-01-10 ENCOUNTER — Other Ambulatory Visit (HOSPITAL_COMMUNITY): Payer: BLUE CROSS/BLUE SHIELD

## 2016-01-10 ENCOUNTER — Other Ambulatory Visit (INDEPENDENT_AMBULATORY_CARE_PROVIDER_SITE_OTHER): Payer: BLUE CROSS/BLUE SHIELD

## 2016-01-10 DIAGNOSIS — R972 Elevated prostate specific antigen [PSA]: Secondary | ICD-10-CM | POA: Diagnosis not present

## 2016-01-10 LAB — PSA: PSA: 3.01 ng/mL (ref 0.10–4.00)

## 2016-01-11 ENCOUNTER — Other Ambulatory Visit (HOSPITAL_COMMUNITY): Payer: BLUE CROSS/BLUE SHIELD

## 2016-01-12 ENCOUNTER — Other Ambulatory Visit (HOSPITAL_COMMUNITY): Payer: BLUE CROSS/BLUE SHIELD

## 2016-01-13 ENCOUNTER — Other Ambulatory Visit (HOSPITAL_COMMUNITY): Payer: BLUE CROSS/BLUE SHIELD

## 2016-01-15 ENCOUNTER — Encounter: Payer: Self-pay | Admitting: Internal Medicine

## 2016-01-16 ENCOUNTER — Other Ambulatory Visit (HOSPITAL_COMMUNITY): Payer: BLUE CROSS/BLUE SHIELD

## 2016-01-17 ENCOUNTER — Other Ambulatory Visit (HOSPITAL_COMMUNITY): Payer: BLUE CROSS/BLUE SHIELD

## 2016-01-18 ENCOUNTER — Other Ambulatory Visit (HOSPITAL_COMMUNITY): Payer: BLUE CROSS/BLUE SHIELD

## 2016-01-19 ENCOUNTER — Other Ambulatory Visit (HOSPITAL_COMMUNITY): Payer: BLUE CROSS/BLUE SHIELD

## 2016-01-20 ENCOUNTER — Other Ambulatory Visit (HOSPITAL_COMMUNITY): Payer: BLUE CROSS/BLUE SHIELD

## 2016-02-23 ENCOUNTER — Telehealth: Payer: Self-pay | Admitting: Internal Medicine

## 2016-02-23 MED ORDER — MESALAMINE 1.2 G PO TBEC: 2.4000 g | DELAYED_RELEASE_TABLET | Freq: Every day | ORAL | Status: DC

## 2016-02-23 NOTE — Telephone Encounter (Signed)
Hx- [facility]/ [facility] pt.Spoke with patient and he states his insurance company would not pay for Lialda so he had to switch to Delzicol(2 capsule TID) in April. He reports the Delzicol is not working for him like the [place] [employer]. Reports rectal pain, diarrhea with blood since starting it. He is asking to switch back to Lialda. Dr. Carlean Purl out of office. DOD-Danis, please, advise.

## 2016-02-23 NOTE — Telephone Encounter (Signed)
We have sufficient Lialda samples for him to take his dose (which appears to be 2 capsules once a day) for the next 2 weeks. Meanwhile, please work on authorization for [place] [employer], since he has clearly failed delzicol.

## 2016-02-23 NOTE — Telephone Encounter (Signed)
Left a message for patient to call back. 

## 2016-02-23 NOTE — Telephone Encounter (Signed)
Patient aware. Samples up front for pick up.

## 2016-02-25 ENCOUNTER — Other Ambulatory Visit (HOSPITAL_COMMUNITY): Payer: Self-pay | Admitting: Medical

## 2016-03-08 ENCOUNTER — Telehealth: Payer: Self-pay | Admitting: Internal Medicine

## 2016-03-08 NOTE — Telephone Encounter (Signed)
Left message to call me back.

## 2016-03-09 MED ORDER — MESALAMINE 1.2 G PO TBEC: 2.4000 g | DELAYED_RELEASE_TABLET | Freq: Every day | ORAL | Status: DC

## 2016-03-09 NOTE — Telephone Encounter (Signed)
[name] is going to come by and pick up samples of the Lialda and I'll check into seeing if the prior authorization has been done , looks like [facility] [facility] sent the [place] into the [employer] on 02/23/16.

## 2016-03-09 NOTE — Telephone Encounter (Signed)
Left message to call me back.

## 2016-03-09 NOTE — Telephone Encounter (Signed)
I called a rx for the lialda in to [facility] and they will send me the prior authorization information to work on per the pharmacist.

## 2016-03-14 NOTE — Telephone Encounter (Signed)
I have done a CoverMyMeds form for the Lialda and will await the outcome.

## 2016-03-22 NOTE — Telephone Encounter (Signed)
Called pharmacy and Lialda ran thru ok this AM.

## 2016-04-02 ENCOUNTER — Other Ambulatory Visit: Payer: Self-pay | Admitting: Internal Medicine

## 2016-04-12 ENCOUNTER — Encounter: Payer: Self-pay | Admitting: Internal Medicine

## 2016-04-18 ENCOUNTER — Encounter: Payer: Self-pay | Admitting: Internal Medicine

## 2016-04-23 ENCOUNTER — Other Ambulatory Visit: Payer: Self-pay | Admitting: *Deleted

## 2016-04-23 MED ORDER — ATORVASTATIN CALCIUM 80 MG PO TABS: ORAL_TABLET | ORAL | 0 refills | Status: DC

## 2016-04-23 NOTE — Telephone Encounter (Signed)
Rec'd call pt states his cpx is not until 9/7, but he is currently out of his atorvastatin. Requesting refill until appt. Inform pt will send 30 day until cpx appt...[name]

## 2016-05-10 ENCOUNTER — Ambulatory Visit (INDEPENDENT_AMBULATORY_CARE_PROVIDER_SITE_OTHER): Payer: BLUE CROSS/BLUE SHIELD | Admitting: Internal Medicine

## 2016-05-10 ENCOUNTER — Encounter: Payer: Self-pay | Admitting: Internal Medicine

## 2016-05-10 ENCOUNTER — Other Ambulatory Visit (INDEPENDENT_AMBULATORY_CARE_PROVIDER_SITE_OTHER): Payer: BLUE CROSS/BLUE SHIELD

## 2016-05-10 VITALS — BP 124/82 | HR 68 | Temp 97.7°F | Ht 74.0 in | Wt 212.4 lb

## 2016-05-10 DIAGNOSIS — Z1159 Encounter for screening for other viral diseases: Secondary | ICD-10-CM

## 2016-05-10 DIAGNOSIS — E7800 Pure hypercholesterolemia, unspecified: Secondary | ICD-10-CM | POA: Diagnosis not present

## 2016-05-10 DIAGNOSIS — Z23 Encounter for immunization: Secondary | ICD-10-CM | POA: Diagnosis not present

## 2016-05-10 DIAGNOSIS — Z0000 Encounter for general adult medical examination without abnormal findings: Secondary | ICD-10-CM

## 2016-05-10 DIAGNOSIS — B351 Tinea unguium: Secondary | ICD-10-CM | POA: Diagnosis not present

## 2016-05-10 DIAGNOSIS — Z0001 Encounter for general adult medical examination with abnormal findings: Secondary | ICD-10-CM

## 2016-05-10 LAB — COMPREHENSIVE METABOLIC PANEL
ALBUMIN: 4.8 g/dL (ref 3.5–5.2)
ALT: 35 U/L (ref 0–53)
AST: 27 U/L (ref 0–37)
Alkaline Phosphatase: 64 U/L (ref 39–117)
BILIRUBIN TOTAL: 0.8 mg/dL (ref 0.2–1.2)
BUN: 16 mg/dL (ref 6–23)
CALCIUM: 9.4 mg/dL (ref 8.4–10.5)
CO2: 31 meq/L (ref 19–32)
CREATININE: 0.86 mg/dL (ref 0.40–1.50)
Chloride: 102 mEq/L (ref 96–112)
GFR: 96.34 mL/min (ref >60.00)
Glucose, Bld: 101 mg/dL — ABNORMAL HIGH (ref 70–99)
Potassium: 4.2 mEq/L (ref 3.5–5.1)
SODIUM: 137 meq/L (ref 135–145)
Total Protein: 7.4 g/dL (ref 6.0–8.3)

## 2016-05-10 LAB — LIPID PANEL
CHOL/HDL RATIO: 3
CHOLESTEROL: 155 mg/dL (ref 0–200)
HDL: 48.5 mg/dL (ref >39.00)
LDL Cholesterol: 85 mg/dL (ref 0–99)
NonHDL: 106.25
TRIGLYCERIDES: 106 mg/dL (ref 0.0–149.0)
VLDL: 21.2 mg/dL (ref 0.0–40.0)

## 2016-05-10 LAB — CBC WITH DIFFERENTIAL/PLATELET
BASOS ABS: 0 10*3/uL (ref 0.0–0.1)
Basophils Relative: 0.6 % (ref 0.0–3.0)
EOS ABS: 0.2 10*3/uL (ref 0.0–0.7)
Eosinophils Relative: 2.3 % (ref 0.0–5.0)
HEMATOCRIT: 42.4 % (ref 39.0–52.0)
Hemoglobin: 14.4 g/dL (ref 13.0–17.0)
LYMPHS PCT: 31 % (ref 12.0–46.0)
Lymphs Abs: 2.3 10*3/uL (ref 0.7–4.0)
MCHC: 33.9 g/dL (ref 30.0–36.0)
MCV: 91.4 fl (ref 78.0–100.0)
MONO ABS: 0.7 10*3/uL (ref 0.1–1.0)
Monocytes Relative: 9.4 % (ref 3.0–12.0)
NEUTROS ABS: 4.1 10*3/uL (ref 1.4–7.7)
NEUTROS PCT: 56.7 % (ref 43.0–77.0)
PLATELETS: 239 10*3/uL (ref 150.0–400.0)
RBC: 4.64 Mil/uL (ref 4.22–5.81)
RDW: 13.5 % (ref 11.5–15.5)
WBC: 7.3 10*3/uL (ref 4.0–10.5)

## 2016-05-10 LAB — TSH: TSH: 3.03 u[IU]/mL (ref 0.35–4.50)

## 2016-05-10 MED ORDER — TERBINAFINE HCL 250 MG PO TABS: 250.0000 mg | ORAL_TABLET | Freq: Every day | ORAL | 0 refills | Status: DC

## 2016-05-10 NOTE — Progress Notes (Signed)
Subjective:  Patient ID: [name], male    DOB: 1956-02-05  Age: 60 y.o. MRN: [id number]  CC: Annual Exam   HPI [name] presents for a CPX.  He complains of thick, discolored toenails, that are hard for him to trim. This has  been worsening over the last year or 2.  He is tolerating his cholesterol medicine well with no muscle or joint aches.  He has had no alcohol for the last month and is being treated elsewhere for depression with psychosis and alcohol dependence. He has noted some improvement with Campral and naltrexone - his cravings have decreased.  Outpatient Medications Prior to Visit  Medication Sig Dispense Refill  . acamprosate (CAMPRAL) 333 MG tablet TAKE 2 TABLETS (666 MG TOTAL) BY MOUTH 3 (THREE) TIMES DAILY WITH MEALS. FOR CRAVINGS 180 tablet 1  . ampicillin (PRINCIPEN) 500 MG capsule Reported on 12/12/2015  1  . atorvastatin (LIPITOR) 80 MG tablet Take 1 tablet by mouth daily Keep Sept appt for future refills 30 tablet 0  . DULoxetine (CYMBALTA) 60 MG capsule Take 60 mg by mouth daily.    . fluorouracil (EFUDEX) 5 % cream Reported on 12/04/2015  0  . lamoTRIgine (LAMICTAL) 200 MG tablet   1  . mesalamine (LIALDA) 1.2 g EC tablet Take 2 tablets (2.4 g total) by mouth daily. 180 tablet 3  . naltrexone (DEPADE) 50 MG tablet TAKE 1 TABLET (50 MG TOTAL) BY MOUTH DAILY. 30 tablet 1  . Omega-3 Fatty Acids (FISH OIL) 1200 MG CAPS Take by mouth 2 (two) times daily.      . QUEtiapine (SEROQUEL) 400 MG tablet   1  . Multiple Vitamin (MULTIVITAMIN) capsule Take 1 capsule by mouth daily.      . hydrOXYzine (VISTARIL) 25 MG capsule   0   No facility-administered medications prior to visit.     ROS Review of Systems  Constitutional: Negative.  Negative for activity change, appetite change, diaphoresis and fatigue.  HENT: Negative.  Negative for trouble swallowing.   Eyes: Negative.   Respiratory: Negative.  Negative for cough, choking, chest tightness, shortness of  breath and stridor.   Cardiovascular: Negative.  Negative for chest pain, palpitations and leg swelling.  Gastrointestinal: Negative.  Negative for abdominal pain, blood in stool, constipation, diarrhea, nausea and vomiting.  Endocrine: Negative.   Genitourinary: Negative.  Negative for difficulty urinating, dysuria, frequency, hematuria, penile pain, penile swelling, scrotal swelling and urgency.  Musculoskeletal: Negative.  Negative for arthralgias, back pain, joint swelling, myalgias and neck pain.  Skin: Negative.  Negative for color change and rash.  Allergic/Immunologic: Negative.   Neurological: Negative.   Hematological: Negative.  Negative for adenopathy. Does not bruise/bleed easily.  Psychiatric/Behavioral: Negative.     Objective:  BP 124/82 (BP Location: Right Arm, Patient Position: Sitting, Cuff Size: Large)   Pulse 68   Temp 97.7 F (36.5 C) (Oral)   Ht 6' 2"  (1.88 m)   Wt 212 lb 6 oz (96.3 kg)   SpO2 98%   BMI 27.27 kg/m   BP Readings from Last 3 Encounters:  05/10/16 124/82  07/07/15 129/87  03/17/15 136/86    Wt Readings from Last 3 Encounters:  05/10/16 212 lb 6 oz (96.3 kg)  07/07/15 216 lb (98 kg)  06/23/15 216 lb (98 kg)    Physical Exam  Constitutional: He is oriented to person, place, and time. He appears well-developed. No distress.  HENT:  Head: Normocephalic and atraumatic.  Mouth/Throat: Oropharynx is  clear and moist. No oropharyngeal exudate.  Eyes: Conjunctivae are normal. Right eye exhibits no discharge. Left eye exhibits no discharge. No scleral icterus.  Neck: Normal range of motion. Neck supple. No JVD present. No tracheal deviation present. No thyromegaly present.  Cardiovascular: Normal rate, regular rhythm, normal heart sounds and intact distal pulses.  Exam reveals no gallop and no friction rub.   No murmur heard. Pulmonary/Chest: Effort normal and breath sounds normal. No stridor. No respiratory distress. He has no wheezes. He has no  rales. He exhibits no tenderness.  Abdominal: Soft. Bowel sounds are normal. He exhibits no distension and no mass. There is no tenderness. There is no rebound and no guarding. Hernia confirmed negative in the right inguinal area and confirmed negative in the left inguinal area.  Genitourinary: Rectum normal, testes normal and penis normal. Rectal exam shows no external hemorrhoid, no internal hemorrhoid, no fissure, no mass, no tenderness, anal tone normal and guaiac negative stool. Prostate is enlarged (1+ smooth symm BPH). Prostate is not tender. Right testis shows no mass, no swelling and no tenderness. Right testis is descended. Left testis shows no mass, no swelling and no tenderness. Left testis is descended. Circumcised. No penile tenderness. No discharge found.  Musculoskeletal: Normal range of motion. He exhibits no edema, tenderness or deformity.  10 over 10 toenails reveal subungual debris, nail thickening, lysis of the toenails  Lymphadenopathy:    He has no cervical adenopathy.       Right: No inguinal adenopathy present.       Left: No inguinal adenopathy present.  Neurological: He is oriented to person, place, and time.  Skin: Skin is warm and dry. No rash noted. He is not diaphoretic. No erythema. No pallor.  Psychiatric: He has a normal mood and affect. His behavior is normal. Judgment and thought content normal.  Vitals reviewed.   Lab Results  Component Value Date   WBC 7.3 05/10/2016   HGB 14.4 05/10/2016   HCT 42.4 05/10/2016   PLT 239.0 05/10/2016   GLUCOSE 101 (H) 05/10/2016   CHOL 155 05/10/2016   TRIG 106.0 05/10/2016   HDL 48.50 05/10/2016   LDLDIRECT 166 01/09/2011   LDLCALC 85 05/10/2016   ALT 35 05/10/2016   AST 27 05/10/2016   NA 137 05/10/2016   K 4.2 05/10/2016   CL 102 05/10/2016   CREATININE 0.86 05/10/2016   BUN 16 05/10/2016   CO2 31 05/10/2016   TSH 3.03 05/10/2016   PSA 3.01 01/10/2016   INR 0.9 03/08/2008    Dg Chest 2 View  Result  Date: 09/21/2013 CLINICAL DATA:  Cough EXAM: CHEST - 2 VIEW COMPARISON:  03/08/2008 FINDINGS: Lungs are clear. Heart size and mediastinal contours are within normal limits. No effusion. Visualized skeletal structures are unremarkable. IMPRESSION: No acute cardiopulmonary disease. Electronically Signed   By: Arne Cleveland M.D.   On: 09/21/2013 14:24    Assessment & Plan:   Samel was seen today for annual exam.  Diagnoses and all orders for this visit:  Routine general medical examination at a health care facility- exam completed, labs ordered and reviewed, his PSA was normal 3 months ago some not worried about prostate cancer, vaccines reviewed and updated, his colonoscopy is up-to-date, patient education material was given. -     Lipid panel; Future -     Comprehensive metabolic panel; Future -     CBC with Differential/Platelet; Future -     TSH; Future  Need for hepatitis C  screening test -     Hepatitis C antibody; Future  Need for prophylactic vaccination and inoculation against influenza -     Flu Vaccine QUAD 36+ mos IM  Need for shingles vaccine -     Varicella-zoster vaccine subcutaneous  Onychomycosis of toenail- his liver enzymes are normal so will start terbinafine, he will let me know if he develops any side effects, I have asked him to return in 3-4 weeks for me to recheck his liver enzymes and if they remain normal then will refill the terbinafine for an additional 2 months for total of 3 months of therapy -     terbinafine (LAMISIL) 250 MG tablet; Take 1 tablet (250 mg total) by mouth daily.   I have discontinued Mr. Shaff's multivitamin and hydrOXYzine. I am also having him start on terbinafine. Additionally, I am having him maintain his Fish Oil, DULoxetine, ampicillin, lamoTRIgine, QUEtiapine, fluorouracil, naltrexone, acamprosate, mesalamine, and atorvastatin.  Meds ordered this encounter  Medications  . terbinafine (LAMISIL) 250 MG tablet    Sig: Take 1  tablet (250 mg total) by mouth daily.    Dispense:  30 tablet    Refill:  0     Follow-up: Return if symptoms worsen or fail to improve.  [name], MD

## 2016-05-10 NOTE — Progress Notes (Signed)
Pre visit review using our clinic review tool, if applicable. No additional management support is needed unless otherwise documented below in the visit note. 

## 2016-05-10 NOTE — Patient Instructions (Signed)

## 2016-05-11 ENCOUNTER — Encounter: Payer: Self-pay | Admitting: Internal Medicine

## 2016-05-11 LAB — HEPATITIS C ANTIBODY: HCV AB: NEGATIVE

## 2016-05-12 NOTE — Assessment & Plan Note (Signed)
He has achieved his LDL goal is doing well on the statin

## 2016-05-14 ENCOUNTER — Telehealth: Payer: Self-pay | Admitting: Emergency Medicine

## 2016-05-14 ENCOUNTER — Telehealth: Payer: Self-pay | Admitting: Internal Medicine

## 2016-05-14 NOTE — Telephone Encounter (Signed)
Copy of results placed up front.

## 2016-05-14 NOTE — Telephone Encounter (Signed)
Please call patient back on his lab results

## 2016-05-14 NOTE — Telephone Encounter (Signed)
Patient called and would like a copy of his lab results. He will come by and pickup them up tomorrow . Thanks

## 2016-05-14 NOTE — Telephone Encounter (Signed)
Phone note: pt is requesting copy of lab results and picking up. This has been placed up front.

## 2016-05-20 ENCOUNTER — Other Ambulatory Visit: Payer: Self-pay | Admitting: Internal Medicine

## 2016-05-22 ENCOUNTER — Telehealth: Payer: Self-pay

## 2016-05-22 DIAGNOSIS — K719 Toxic liver disease, unspecified: Secondary | ICD-10-CM

## 2016-05-22 NOTE — Telephone Encounter (Signed)
Patient called back about his labs. Was Dr. Ronnald Ramp just wanting him to come in in 3-4Wk fro lab work if so orders need to be put in. Or was he wanting him to come in and be seen. Please advise once Dr. Ronnald Ramp gets back from vacation.

## 2016-05-22 NOTE — Telephone Encounter (Signed)
PCP wanted pt to come in for labs.  Labs have been entered for pt.   Pt informed of same.

## 2016-05-22 NOTE — Addendum Note (Signed)
Addended by: Aviva Signs M on: 05/22/2016 12:01 PM   Modules accepted: Orders

## 2016-06-07 ENCOUNTER — Other Ambulatory Visit (INDEPENDENT_AMBULATORY_CARE_PROVIDER_SITE_OTHER): Payer: BLUE CROSS/BLUE SHIELD

## 2016-06-07 DIAGNOSIS — K719 Toxic liver disease, unspecified: Secondary | ICD-10-CM

## 2016-06-07 LAB — COMPREHENSIVE METABOLIC PANEL
ALBUMIN: 4.3 g/dL (ref 3.5–5.2)
ALK PHOS: 75 U/L (ref 39–117)
ALT: 32 U/L (ref 0–53)
AST: 25 U/L (ref 0–37)
BILIRUBIN TOTAL: 0.5 mg/dL (ref 0.2–1.2)
BUN: 15 mg/dL (ref 6–23)
CO2: 31 mEq/L (ref 19–32)
CREATININE: 0.95 mg/dL (ref 0.40–1.50)
Calcium: 9.3 mg/dL (ref 8.4–10.5)
Chloride: 104 mEq/L (ref 96–112)
GFR: 85.86 mL/min (ref >60.00)
Glucose, Bld: 94 mg/dL (ref 70–99)
Potassium: 4.2 mEq/L (ref 3.5–5.1)
SODIUM: 140 meq/L (ref 135–145)
TOTAL PROTEIN: 7 g/dL (ref 6.0–8.3)

## 2016-06-13 ENCOUNTER — Telehealth: Payer: Self-pay | Admitting: Emergency Medicine

## 2016-06-13 NOTE — Telephone Encounter (Signed)
Lmom with details. Labs are ready for pick up at the front. rq for pt to contact our office if pt wants them mailed.

## 2016-06-13 NOTE — Telephone Encounter (Signed)
Pt called and wants to know if he can come by and pick up a copy of his lab results. Please advise thanks.

## 2016-06-18 ENCOUNTER — Telehealth: Payer: Self-pay | Admitting: Emergency Medicine

## 2016-06-18 NOTE — Telephone Encounter (Signed)
Pt called and wants to know if you can call him back about his labs and go over them with him. Thanks.

## 2016-06-19 ENCOUNTER — Telehealth: Payer: Self-pay | Admitting: Internal Medicine

## 2016-06-19 DIAGNOSIS — L570 Actinic keratosis: Secondary | ICD-10-CM | POA: Diagnosis not present

## 2016-06-19 DIAGNOSIS — C44719 Basal cell carcinoma of skin of left lower limb, including hip: Secondary | ICD-10-CM | POA: Diagnosis not present

## 2016-06-19 DIAGNOSIS — C44619 Basal cell carcinoma of skin of left upper limb, including shoulder: Secondary | ICD-10-CM | POA: Diagnosis not present

## 2016-06-19 DIAGNOSIS — Z85828 Personal history of other malignant neoplasm of skin: Secondary | ICD-10-CM | POA: Diagnosis not present

## 2016-06-19 DIAGNOSIS — L821 Other seborrheic keratosis: Secondary | ICD-10-CM | POA: Diagnosis not present

## 2016-06-19 DIAGNOSIS — L438 Other lichen planus: Secondary | ICD-10-CM | POA: Diagnosis not present

## 2016-06-19 NOTE — Telephone Encounter (Signed)
Patient has called back in regard.  He is concerned about "drug induced liver injury" codes.

## 2016-06-19 NOTE — Telephone Encounter (Signed)
Error

## 2016-06-19 NOTE — Telephone Encounter (Signed)
LVM for pt to call back as soon as possible.   

## 2016-06-20 ENCOUNTER — Telehealth: Payer: Self-pay | Admitting: Emergency Medicine

## 2016-06-20 NOTE — Telephone Encounter (Signed)
Pt is returning your call back about his labs and "Drug-induced liver injury". He stated if he doesn't answer you can leave him a message on his VM letting him know what that means if you would like. Thanks.

## 2016-06-21 ENCOUNTER — Other Ambulatory Visit: Payer: Self-pay | Admitting: Internal Medicine

## 2016-06-21 ENCOUNTER — Telehealth: Payer: Self-pay | Admitting: *Deleted

## 2016-06-21 DIAGNOSIS — B351 Tinea unguium: Secondary | ICD-10-CM

## 2016-06-21 MED ORDER — TERBINAFINE HCL 250 MG PO TABS: 250.0000 mg | ORAL_TABLET | Freq: Every day | ORAL | 2 refills | Status: DC

## 2016-06-21 NOTE — Telephone Encounter (Signed)
Called pt no answer LMOM refill sent to [facility]...Johny Chess

## 2016-06-21 NOTE — Telephone Encounter (Signed)
Pt left msg on triage stating he results his lab results, and his liver functions are good. He is wanting to know can his Lamisil be refill...[name]

## 2016-06-21 NOTE — Telephone Encounter (Signed)
Pt contacted and informed that the dx drug induced liver injury was a justification for the cmet redrawn on 06/07/16. Pt started the Lamisil after the labs on 05/10/2016. Lamisil can cause liver injury. Labs on 05/10/2016 were the base line.   Forwarding to PCP as FYI.

## 2016-06-21 NOTE — Telephone Encounter (Signed)
Yes, RX sent

## 2016-08-13 ENCOUNTER — Telehealth: Payer: Self-pay

## 2016-08-13 NOTE — Telephone Encounter (Signed)
Patient notified via voicemail that we will send an rx when he is ready for [place] to his home.  He is asked to call back for any additional questions or concerns. (letter patient sent sent to scanning)

## 2016-08-13 NOTE — Telephone Encounter (Signed)
-----   Message from Gatha Mayer, MD sent at 08/13/2016 11:26 AM EST ----- Regarding: Rx ?  See letter I gave you also  Let him know when/if he needs Rx for meds from [place] I will do it

## 2016-08-31 ENCOUNTER — Telehealth: Payer: Self-pay

## 2016-08-31 MED ORDER — MESALAMINE 1.2 G PO TBEC: 2.4000 g | DELAYED_RELEASE_TABLET | Freq: Every day | ORAL | 2 refills | Status: DC

## 2016-08-31 NOTE — Telephone Encounter (Signed)
Patient requesting that we fax a rx to [place] for his Lialda. Approved by Dr Carlean Purl and faxed to [facility] , fax # [phone].  Got confirmation that it went thru.  Will send patient's letter downstairs to be scanned into epic.

## 2016-09-24 ENCOUNTER — Telehealth: Payer: Self-pay | Admitting: Internal Medicine

## 2016-09-24 NOTE — Telephone Encounter (Signed)
Left Hazael a detailed message that we no longer have any samples of the Lialda since it's gone generic.  I told him to call me back if he wants me to call some in locally for him.

## 2017-05-22 ENCOUNTER — Other Ambulatory Visit: Payer: Self-pay | Admitting: Internal Medicine

## 2017-05-29 ENCOUNTER — Other Ambulatory Visit: Payer: Self-pay | Admitting: Internal Medicine

## 2017-06-03 ENCOUNTER — Telehealth: Payer: Self-pay | Admitting: Internal Medicine

## 2017-06-03 MED ORDER — ATORVASTATIN CALCIUM 80 MG PO TABS: 80.0000 mg | ORAL_TABLET | Freq: Every day | ORAL | 0 refills | Status: DC

## 2017-06-03 NOTE — Telephone Encounter (Signed)
Notified pt Per office policy sent 30 day to local pharmacy until appt...[name]

## 2017-06-03 NOTE — Telephone Encounter (Signed)
Pt called requesting a refill on atorvastatin (LIPITOR) 80 MG tablet. He has not been seen since 05/2016. He did schedule a physical for the first available morning appointment on 07/03/2017. He asked if a small supply could be sent in to get him through until he is able to come in for the visit. Please advise.

## 2017-06-28 ENCOUNTER — Telehealth: Payer: Self-pay | Admitting: Internal Medicine

## 2017-06-28 MED ORDER — MESALAMINE 1.2 G PO TBEC: 2.4000 g | DELAYED_RELEASE_TABLET | Freq: Every day | ORAL | 3 refills | Status: DC

## 2017-06-28 NOTE — Telephone Encounter (Signed)
Left patient detailed message that his mesalamine has been faxed to [place] and I got confirmation that it went thru.  I left on the message for him to call us to get appointment and to let us know how he's doing.

## 2017-06-28 NOTE — Telephone Encounter (Signed)
Please advise, last seen November 2016.

## 2017-06-28 NOTE — Telephone Encounter (Signed)
OK to refill x 4 and ask him to make an appointment or make him one for next available  Also ask if he is ok or having problems with UC

## 2017-07-03 ENCOUNTER — Encounter: Payer: Self-pay | Admitting: Internal Medicine

## 2017-07-08 ENCOUNTER — Other Ambulatory Visit: Payer: Self-pay | Admitting: Internal Medicine

## 2017-07-08 NOTE — Telephone Encounter (Signed)
Pt needs an appointment for refills.

## 2017-07-08 NOTE — Telephone Encounter (Signed)
LVM informing patient of response and to call back to sch appoitment

## 2017-07-09 NOTE — Telephone Encounter (Signed)
Tried to leave another message for patient and his mail box is full.

## 2017-07-11 NOTE — Telephone Encounter (Signed)
Pt called and does not have insurance until January, patient made appt for January 1/7 Pt would like a refill sent in of his Atorvastatin

## 2017-07-11 NOTE — Telephone Encounter (Signed)
He can see me sometime in later 2019 then

## 2017-07-11 NOTE — Telephone Encounter (Signed)
[name] called in this AM and said he is doing great, no problems at all.  He has a physical in January with Dr Ronnald Ramp and he is sure they will do blood work.  He wants to know if that's okay or do you still need him to come in.  His colon recall is in Nov. 2019 and he is aware of that. Please advise, thanks.

## 2017-07-11 NOTE — Telephone Encounter (Signed)
Patient informed and said thank you.

## 2017-07-17 ENCOUNTER — Other Ambulatory Visit: Payer: Self-pay | Admitting: Internal Medicine

## 2017-09-03 HISTORY — PX: COLONOSCOPY: SHX174

## 2017-09-09 ENCOUNTER — Telehealth: Payer: Self-pay

## 2017-09-09 ENCOUNTER — Ambulatory Visit: Payer: BLUE CROSS/BLUE SHIELD | Admitting: Internal Medicine

## 2017-09-09 ENCOUNTER — Other Ambulatory Visit (INDEPENDENT_AMBULATORY_CARE_PROVIDER_SITE_OTHER): Payer: BLUE CROSS/BLUE SHIELD

## 2017-09-09 ENCOUNTER — Encounter: Payer: Self-pay | Admitting: Internal Medicine

## 2017-09-09 VITALS — BP 130/82 | HR 76 | Temp 98.7°F | Ht 74.0 in | Wt 244.0 lb

## 2017-09-09 DIAGNOSIS — E7800 Pure hypercholesterolemia, unspecified: Secondary | ICD-10-CM

## 2017-09-09 DIAGNOSIS — Z0000 Encounter for general adult medical examination without abnormal findings: Secondary | ICD-10-CM | POA: Diagnosis not present

## 2017-09-09 DIAGNOSIS — K5100 Ulcerative (chronic) pancolitis without complications: Secondary | ICD-10-CM | POA: Diagnosis not present

## 2017-09-09 DIAGNOSIS — Z23 Encounter for immunization: Secondary | ICD-10-CM | POA: Diagnosis not present

## 2017-09-09 LAB — LIPID PANEL
CHOL/HDL RATIO: 4
Cholesterol: 178 mg/dL (ref 0–200)
HDL: 47.1 mg/dL (ref >39.00)
NONHDL: 130.55
TRIGLYCERIDES: 233 mg/dL — AB (ref 0.0–149.0)
VLDL: 46.6 mg/dL — ABNORMAL HIGH (ref 0.0–40.0)

## 2017-09-09 LAB — COMPREHENSIVE METABOLIC PANEL
ALK PHOS: 73 U/L (ref 39–117)
ALT: 26 U/L (ref 0–53)
AST: 23 U/L (ref 0–37)
Albumin: 4.6 g/dL (ref 3.5–5.2)
BUN: 13 mg/dL (ref 6–23)
CO2: 29 mEq/L (ref 19–32)
Calcium: 9.6 mg/dL (ref 8.4–10.5)
Chloride: 103 mEq/L (ref 96–112)
Creatinine, Ser: 0.94 mg/dL (ref 0.40–1.50)
GFR: 86.55 mL/min (ref >60.00)
Glucose, Bld: 101 mg/dL — ABNORMAL HIGH (ref 70–99)
POTASSIUM: 4 meq/L (ref 3.5–5.1)
SODIUM: 139 meq/L (ref 135–145)
TOTAL PROTEIN: 6.7 g/dL (ref 6.0–8.3)
Total Bilirubin: 0.8 mg/dL (ref 0.2–1.2)

## 2017-09-09 LAB — CBC WITH DIFFERENTIAL/PLATELET
BASOS ABS: 0 10*3/uL (ref 0.0–0.1)
Basophils Relative: 0.8 % (ref 0.0–3.0)
EOS PCT: 2.2 % (ref 0.0–5.0)
Eosinophils Absolute: 0.1 10*3/uL (ref 0.0–0.7)
HCT: 42.4 % (ref 39.0–52.0)
Hemoglobin: 14.4 g/dL (ref 13.0–17.0)
LYMPHS ABS: 1.8 10*3/uL (ref 0.7–4.0)
Lymphocytes Relative: 28.4 % (ref 12.0–46.0)
MCHC: 34 g/dL (ref 30.0–36.0)
MCV: 93.8 fl (ref 78.0–100.0)
MONO ABS: 0.6 10*3/uL (ref 0.1–1.0)
Monocytes Relative: 9.5 % (ref 3.0–12.0)
NEUTROS PCT: 59.1 % (ref 43.0–77.0)
Neutro Abs: 3.8 10*3/uL (ref 1.4–7.7)
PLATELETS: 212 10*3/uL (ref 150.0–400.0)
RBC: 4.52 Mil/uL (ref 4.22–5.81)
RDW: 13.2 % (ref 11.5–15.5)
WBC: 6.4 10*3/uL (ref 4.0–10.5)

## 2017-09-09 LAB — TSH: TSH: 1.35 u[IU]/mL (ref 0.35–4.50)

## 2017-09-09 LAB — PSA: PSA: 3.29 ng/mL (ref 0.10–4.00)

## 2017-09-09 LAB — LDL CHOLESTEROL, DIRECT: Direct LDL: 102 mg/dL

## 2017-09-09 NOTE — Patient Instructions (Signed)

## 2017-09-09 NOTE — Telephone Encounter (Signed)
yes

## 2017-09-09 NOTE — Telephone Encounter (Signed)
Patient has been added to shingrix waitlist

## 2017-09-09 NOTE — Progress Notes (Signed)
Subjective:  Patient ID: Abdelaziz Westenberger, male    DOB: Jul 05, 1956  Age: 62 y.o. MRN: [id number]  CC: Annual Exam and Hyperlipidemia   HPI Antoinne Spadaccini presents for a CPX.  He complains of weight gain.  He tells me he eats too many sweets and desserts.  He is active and works out at [employer].  He denies CP, DOE, palpitations, edema, or fatigue.  He drinks about 6 alcoholic beverages per week.  Outpatient Medications Prior to Visit  Medication Sig Dispense Refill  . ampicillin (PRINCIPEN) 500 MG capsule Reported on 12/12/2015  1  . atorvastatin (LIPITOR) 80 MG tablet Take 1 tablet (80 mg total) daily by mouth. 90 tablet 0  . DULoxetine (CYMBALTA) 60 MG capsule Take 60 mg by mouth daily.    . fluorouracil (EFUDEX) 5 % cream Reported on 12/04/2015  0  . lamoTRIgine (LAMICTAL) 200 MG tablet   1  . mesalamine (LIALDA) 1.2 g EC tablet Take 2 tablets (2.4 g total) by mouth daily. 200 tablet 3  . naltrexone (DEPADE) 50 MG tablet TAKE 1 TABLET (50 MG TOTAL) BY MOUTH DAILY. 30 tablet 1  . Omega-3 Fatty Acids (FISH OIL) 1200 MG CAPS Take by mouth 2 (two) times daily.      . QUEtiapine (SEROQUEL) 400 MG tablet   1  . atorvastatin (LIPITOR) 80 MG tablet Take 1 tablet (80 mg total) by mouth daily. Must keep appt for future refills 30 tablet 0  . acamprosate (CAMPRAL) 333 MG tablet TAKE 2 TABLETS (666 MG TOTAL) BY MOUTH 3 (THREE) TIMES DAILY WITH MEALS. FOR CRAVINGS 180 tablet 1  . terbinafine (LAMISIL) 250 MG tablet Take 1 tablet (250 mg total) by mouth daily. 30 tablet 2   No facility-administered medications prior to visit.     ROS Review of Systems  Constitutional: Positive for unexpected weight change. Negative for diaphoresis and fatigue.  HENT: Negative.   Eyes: Negative.  Negative for visual disturbance.  Respiratory: Negative.  Negative for cough, chest tightness, shortness of breath and wheezing.   Cardiovascular: Negative for chest pain, palpitations and leg swelling.  Gastrointestinal:  Negative for abdominal pain, constipation, diarrhea, nausea and vomiting.  Endocrine: Negative.   Genitourinary: Negative.  Negative for difficulty urinating, penile swelling, scrotal swelling and testicular pain.  Musculoskeletal: Negative.  Negative for arthralgias, myalgias and neck pain.  Skin: Negative.   Allergic/Immunologic: Negative.   Neurological: Negative.   Hematological: Negative for adenopathy. Does not bruise/bleed easily.  Psychiatric/Behavioral: Negative.  Negative for decreased concentration, dysphoric mood, sleep disturbance and suicidal ideas. The patient is not nervous/anxious.     Objective:  BP 130/82 (BP Location: Left Arm, Patient Position: Sitting, Cuff Size: Normal)   Pulse 76   Temp 98.7 F (37.1 C) (Oral)   Ht 6' 2"  (1.88 m)   Wt 244 lb (110.7 kg)   SpO2 98%   BMI 31.33 kg/m   BP Readings from Last 3 Encounters:  09/09/17 130/82  05/10/16 124/82  07/07/15 129/87    Wt Readings from Last 3 Encounters:  09/09/17 244 lb (110.7 kg)  05/10/16 212 lb 6 oz (96.3 kg)  07/07/15 216 lb (98 kg)    Physical Exam  Constitutional: He is oriented to person, place, and time. No distress.  HENT:  Mouth/Throat: Oropharynx is clear and moist. No oropharyngeal exudate.  Eyes: Conjunctivae are normal. Right eye exhibits no discharge. Left eye exhibits no discharge. No scleral icterus.  Neck: Normal range of motion. Neck supple.  No JVD present. No thyromegaly present.  Cardiovascular: Normal rate, regular rhythm and normal heart sounds. Exam reveals no gallop.  No murmur heard. Pulmonary/Chest: Effort normal and breath sounds normal. No respiratory distress. He has no wheezes. He has no rales.  Abdominal: Soft. Bowel sounds are normal. He exhibits no distension and no mass. There is no tenderness. There is no guarding. Hernia confirmed negative in the right inguinal area and confirmed negative in the left inguinal area.  Genitourinary: Rectum normal, testes normal  and penis normal. Rectal exam shows no external hemorrhoid, no internal hemorrhoid, no fissure, no mass, no tenderness, anal tone normal and guaiac negative stool. Prostate is enlarged (1+ smooth symm BPH). Prostate is not tender. Right testis shows no mass, no swelling and no tenderness. Right testis is descended. Left testis shows no mass, no swelling and no tenderness. Left testis is descended. Circumcised. No penile erythema or penile tenderness. No discharge found.  Musculoskeletal: Normal range of motion. He exhibits no edema or tenderness.  Lymphadenopathy:    He has no cervical adenopathy.       Right: No inguinal adenopathy present.       Left: No inguinal adenopathy present.  Neurological: He is alert and oriented to person, place, and time.  Skin: Skin is warm and dry. No rash noted. He is not diaphoretic. No erythema. No pallor.  Psychiatric: He has a normal mood and affect. His behavior is normal. Judgment and thought content normal.  Vitals reviewed.   Lab Results  Component Value Date   WBC 6.4 09/09/2017   HGB 14.4 09/09/2017   HCT 42.4 09/09/2017   PLT 212.0 09/09/2017   GLUCOSE 101 (H) 09/09/2017   CHOL 178 09/09/2017   TRIG 233.0 (H) 09/09/2017   HDL 47.10 09/09/2017   LDLDIRECT 102.0 09/09/2017   LDLCALC 85 05/10/2016   ALT 26 09/09/2017   AST 23 09/09/2017   NA 139 09/09/2017   K 4.0 09/09/2017   CL 103 09/09/2017   CREATININE 0.94 09/09/2017   BUN 13 09/09/2017   CO2 29 09/09/2017   TSH 1.35 09/09/2017   PSA 3.29 09/09/2017   INR 0.9 03/08/2008    Dg Chest 2 View  Result Date: 09/21/2013 CLINICAL DATA:  Cough EXAM: CHEST - 2 VIEW COMPARISON:  03/08/2008 FINDINGS: Lungs are clear. Heart size and mediastinal contours are within normal limits. No effusion. Visualized skeletal structures are unremarkable. IMPRESSION: No acute cardiopulmonary disease. Electronically Signed   By: Arne Cleveland M.D.   On: 09/21/2013 14:24    Assessment & Plan:   Catcher was  seen today for annual exam and hyperlipidemia.  Diagnoses and all orders for this visit:  Universal ulcerative colitis without complication ([place])- This is in remission.  His review of systems and lab work are reassuring. -     Comprehensive metabolic panel; Future -     CBC with Differential/Platelet; Future  Pure hypercholesterolemia- He has achieved his LDL goal and is doing well on the statin. -     Comprehensive metabolic panel; Future -     CBC with Differential/Platelet; Future -     TSH; Future  Routine general medical examination at a health care facility- exam completed, labs reviewed, vaccines reviewed and updated, screening for colon cancer is up-to-date, patient education material was given. -     Lipid panel; Future -     PSA; Future -     HIV antibody; Future  Need for influenza vaccination -  Flu Vaccine QUAD 36+ mos IM   I have discontinued Giovanne Blevens's acamprosate and terbinafine. I am also having him maintain his Fish Oil, DULoxetine, ampicillin, lamoTRIgine, QUEtiapine, fluorouracil, naltrexone, mesalamine, and atorvastatin.  No orders of the defined types were placed in this encounter.    Follow-up: Return in about 1 year (around 09/09/2018).  [name], MD

## 2017-09-09 NOTE — Telephone Encounter (Signed)
Routing request to dr Ronnald Ramp, patient would like to be added to shingrix waitlist---are you ok with patient getting vaccine, please advise, I will add to waitlist if appropriate, thanks

## 2017-09-10 ENCOUNTER — Encounter: Payer: Self-pay | Admitting: Internal Medicine

## 2017-09-10 LAB — HIV ANTIBODY (ROUTINE TESTING W REFLEX): HIV: NONREACTIVE

## 2017-09-11 ENCOUNTER — Encounter: Payer: Self-pay | Admitting: Internal Medicine

## 2017-10-06 ENCOUNTER — Other Ambulatory Visit: Payer: Self-pay | Admitting: Internal Medicine

## 2017-10-08 DIAGNOSIS — F3181 Bipolar II disorder: Secondary | ICD-10-CM | POA: Diagnosis not present

## 2017-10-29 DIAGNOSIS — F3181 Bipolar II disorder: Secondary | ICD-10-CM | POA: Diagnosis not present

## 2017-11-26 ENCOUNTER — Telehealth: Payer: Self-pay

## 2017-11-26 DIAGNOSIS — F3181 Bipolar II disorder: Secondary | ICD-10-CM | POA: Diagnosis not present

## 2017-11-26 NOTE — Telephone Encounter (Signed)
Left message asking patient to call back to schedule nurse visit to get first shingrix vaccine---if appt is made, let tamara,RN at [facility] office know so that both vaccines can be labeled

## 2017-11-27 ENCOUNTER — Telehealth: Payer: Self-pay

## 2017-11-27 NOTE — Telephone Encounter (Signed)
Copied from [place] [id number]. Topic: Quick Communication - Office Called Patient >> Nov 26, 2017  4:20 PM Ander Slade, RN wrote: Reason for CRM: shingrix vaccine, patient needs to schedule nurse visit to get first vaccine, let Karimah Winquist,RN at [facility] office if appt is made so that both vaccines can be labeled >> Nov 27, 2017  3:09 PM Margot Ables wrote: Pt called in and is scheduled for Shingrix 11/28/17. Please hold both for him.  Both vaccines labeled and placed in refrig

## 2017-11-28 ENCOUNTER — Ambulatory Visit: Payer: Self-pay

## 2017-12-03 ENCOUNTER — Ambulatory Visit (INDEPENDENT_AMBULATORY_CARE_PROVIDER_SITE_OTHER): Payer: BLUE CROSS/BLUE SHIELD | Admitting: *Deleted

## 2017-12-03 DIAGNOSIS — Z23 Encounter for immunization: Secondary | ICD-10-CM

## 2017-12-19 ENCOUNTER — Other Ambulatory Visit: Payer: Self-pay | Admitting: Internal Medicine

## 2017-12-19 MED ORDER — MESALAMINE 1.2 G PO TBEC: 2.4000 g | DELAYED_RELEASE_TABLET | Freq: Every day | ORAL | 0 refills | Status: DC

## 2017-12-19 NOTE — Telephone Encounter (Signed)
Lialda rx faxed to [place] as requested, has 02/21/18 appointment. [facility] fax # is [phone].

## 2017-12-23 NOTE — Telephone Encounter (Signed)
Patient wanting to know if there were any lialda samples he could have until his prescription came in from the [place] pharmacy.

## 2017-12-23 NOTE — Telephone Encounter (Signed)
Left a message for patient to return my call. 

## 2017-12-24 MED ORDER — MESALAMINE 1.2 G PO TBEC: 2.4000 g | DELAYED_RELEASE_TABLET | Freq: Every day | ORAL | 0 refills | Status: DC

## 2017-12-24 NOTE — Addendum Note (Signed)
Addended by: Marzella Schlein on: 12/24/2017 08:26 AM   Modules accepted: Orders

## 2017-12-24 NOTE — Telephone Encounter (Signed)
Informed patient we do not cary samples of Lialda any longer since it has went generic. Patient states he checked with several pharmacies to try to get the cheapest temporary supply of Lialda with the Good rx card and thinks [facility] is the cheapest. He states he can get 60 tablets for $179.  Patient states if we send the prescription to the pharmacy then he will pay for a one month supply until he gets the pills from the [place] pharmacy. [facility] [address]

## 2017-12-26 DIAGNOSIS — F3181 Bipolar II disorder: Secondary | ICD-10-CM | POA: Diagnosis not present

## 2018-01-21 DIAGNOSIS — F3181 Bipolar II disorder: Secondary | ICD-10-CM | POA: Diagnosis not present

## 2018-02-10 ENCOUNTER — Ambulatory Visit (INDEPENDENT_AMBULATORY_CARE_PROVIDER_SITE_OTHER): Payer: BLUE CROSS/BLUE SHIELD

## 2018-02-10 DIAGNOSIS — Z23 Encounter for immunization: Secondary | ICD-10-CM | POA: Diagnosis not present

## 2018-02-19 ENCOUNTER — Ambulatory Visit: Payer: Self-pay | Admitting: Internal Medicine

## 2018-02-21 ENCOUNTER — Ambulatory Visit: Payer: BLUE CROSS/BLUE SHIELD | Admitting: Internal Medicine

## 2018-02-21 ENCOUNTER — Encounter: Payer: Self-pay | Admitting: Internal Medicine

## 2018-02-21 VITALS — BP 110/70 | HR 60 | Ht 73.0 in | Wt 227.1 lb

## 2018-02-21 DIAGNOSIS — Z86010 Personal history of colonic polyps: Secondary | ICD-10-CM

## 2018-02-21 DIAGNOSIS — K5100 Ulcerative (chronic) pancolitis without complications: Secondary | ICD-10-CM | POA: Diagnosis not present

## 2018-02-21 MED ORDER — MESALAMINE 1.2 G PO TBEC: 2.4000 g | DELAYED_RELEASE_TABLET | Freq: Every day | ORAL | 3 refills | Status: DC

## 2018-02-21 NOTE — Assessment & Plan Note (Addendum)
Doing well continue Lialda 2.4 g daily he is getting this from [place] Surveillance colonoscopy will be scheduled The risks and benefits as well as alternatives of endoscopic procedure(s) have been discussed and reviewed. All questions answered. The patient agrees to proceed.

## 2018-02-21 NOTE — Patient Instructions (Addendum)
If you are age 62 or older, your body mass index should be between 23-30. Your Body mass index is 29.97 kg/m. If this is out of the aforementioned range listed, please consider follow up with your Primary Care Provider.  If you are age 62 or younger, your body mass index should be between 19-25. Your Body mass index is 29.97 kg/m. If this is out of the aformentioned range listed, please consider follow up with your Primary Care Provider.   You have been scheduled for a colonoscopy. Please follow written instructions given to you at your visit today.  Please pick up your prep supplies at the pharmacy within the next 1-3 days. If you use inhalers (even only as needed), please bring them with you on the day of your procedure. Your physician has requested that you go to www.startemmi.com and enter the access code given to you at your visit today. This web site gives a general overview about your procedure. However, you should still follow specific instructions given to you by our office regarding your preparation for the procedure.  We have sent the following medications to your pharmacy : ( This has been faxed to your pharmacy- [facility], [place], [phone] fax #) and we got a confirmation that it went thru.   Mesalamine   I appreciate the opportunity to care for you. Silvano Rusk, MD, [facility]

## 2018-02-21 NOTE — Progress Notes (Signed)
   [name] 62 y.o. 01-15-1956 [id number]  Assessment & Plan:   Universal ulcerative colitis ([place]) Doing well continue Lialda 2.4 g daily he is getting this from [place] Surveillance colonoscopy will be scheduled The risks and benefits as well as alternatives of endoscopic procedure(s) have been discussed and reviewed. All questions answered. The patient agrees to proceed.   Hx of adenomatous colonic polyps Schedule surveillance colonoscopy    I appreciate the opportunity to care for this patient. CC: Phillip Lima, MD   Subjective:   Chief Complaint: Follow-up of ulcerative colitis and adenomatous polyps  HPI [name] is here last seen in 2016 at colonoscopy, ulcerative colitis was in remission and I removed a sessile serrated polyp and a tubular adenoma.  He denies any gastrointestinal problems at this time.  He would like to have his colonoscopy sooner rather than later it would be typically as early as last Novemberdue but I had set him up for this November. No Known Allergies Current Meds  Medication Sig  . ampicillin (PRINCIPEN) 500 MG capsule Reported on 12/12/2015  . atorvastatin (LIPITOR) 80 MG tablet TAKE ONE TABLET BY MOUTH DAILY  . DULoxetine (CYMBALTA) 60 MG capsule Take 60 mg by mouth daily.  . fluorouracil (EFUDEX) 5 % cream Reported on 12/04/2015  . lamoTRIgine (LAMICTAL) 200 MG tablet   . mesalamine (LIALDA) 1.2 g EC tablet Take 2 tablets (2.4 g total) by mouth daily.  . Multiple Vitamin (MULTIVITAMIN) tablet Take 1 tablet by mouth daily.  . Omega-3 Fatty Acids (FISH OIL) 1200 MG CAPS Take by mouth 2 (two) times daily.    . QUEtiapine (SEROQUEL) 400 MG tablet    Past Medical History:  Diagnosis Date  . Alcoholism ([place])    10 yrs mostly sober in [place]   . Bipolar 2 disorder ([place])   . Cancer ([place])    skin, basal cell  . Depression   . Hemorrhoids 11-23-2010   Colonoscopy with [facility]  . Hyperlipemia   . Muscle spasm of back   . Personal history of  colonic polyps 11-23-2010   5 mm adenoma Colonoscopy with [facility]  . Rosacea   . Ulcerative colitis 11-23-2010   Colonoscopy with [facility]   Past Surgical History:  Procedure Laterality Date  . ACHILLES TENDON REPAIR  1995  . BASAL CELL CARCINOMA EXCISION    . COLONOSCOPY W/ BIOPSIES  2003, 04/2008, 11/2010   2003: proctitis 2009: ulcerative colitis and hemorrhoids 2012: ulcerative colitis and 37m sigmoid  tubular adenoma  . MOHS SURGERY  2006  . SHOULDER SURGERY     Social History   Social History Narrative   1-5 caffeine drinks daily    Exercise 2-3 times a week   family history is not on file. He was adopted.   Review of Systems As per HPI  Objective:   Physical Exam @BP  110/70 (BP Location: Left Arm, Patient Position: Sitting, Cuff Size: Normal)   Pulse 60   Ht 6' 1"  (1.854 m) Comment: height measured without shoes  Wt 227 lb 2 oz (103 kg)   BMI 29.97 kg/m @  General:  NAD Eyes:   anicteric Lungs:  clear Heart::  S1S2 no rubs, murmurs or gallops Abdomen:  soft and nontender, BS+ Ext:   no edema, cyanosis or clubbing    Data Reviewed:  As per HPI

## 2018-02-21 NOTE — Assessment & Plan Note (Signed)
Schedule surveillance colonoscopy

## 2018-03-13 ENCOUNTER — Encounter: Payer: Self-pay | Admitting: Internal Medicine

## 2018-03-24 DIAGNOSIS — F3181 Bipolar II disorder: Secondary | ICD-10-CM | POA: Diagnosis not present

## 2018-03-25 DIAGNOSIS — C44519 Basal cell carcinoma of skin of other part of trunk: Secondary | ICD-10-CM | POA: Diagnosis not present

## 2018-03-25 DIAGNOSIS — L821 Other seborrheic keratosis: Secondary | ICD-10-CM | POA: Diagnosis not present

## 2018-03-25 DIAGNOSIS — D1801 Hemangioma of skin and subcutaneous tissue: Secondary | ICD-10-CM | POA: Diagnosis not present

## 2018-03-25 DIAGNOSIS — L570 Actinic keratosis: Secondary | ICD-10-CM | POA: Diagnosis not present

## 2018-03-25 DIAGNOSIS — Z85828 Personal history of other malignant neoplasm of skin: Secondary | ICD-10-CM | POA: Diagnosis not present

## 2018-04-21 DIAGNOSIS — F3181 Bipolar II disorder: Secondary | ICD-10-CM | POA: Diagnosis not present

## 2018-04-24 ENCOUNTER — Encounter: Payer: Self-pay | Admitting: Internal Medicine

## 2018-05-07 ENCOUNTER — Other Ambulatory Visit: Payer: Self-pay | Admitting: Internal Medicine

## 2018-05-07 ENCOUNTER — Telehealth: Payer: Self-pay | Admitting: Internal Medicine

## 2018-05-07 DIAGNOSIS — E7800 Pure hypercholesterolemia, unspecified: Secondary | ICD-10-CM

## 2018-05-07 MED ORDER — MESALAMINE 1.2 G PO TBEC: 2.4000 g | DELAYED_RELEASE_TABLET | Freq: Every day | ORAL | 0 refills | Status: DC

## 2018-05-07 MED ORDER — ATORVASTATIN CALCIUM 80 MG PO TABS: 80.0000 mg | ORAL_TABLET | Freq: Every day | ORAL | 1 refills | Status: DC

## 2018-05-07 NOTE — Telephone Encounter (Signed)
I left him a detailed message that [name] has been sent to [facility] as requested.

## 2018-05-08 ENCOUNTER — Encounter: Payer: Self-pay | Admitting: Internal Medicine

## 2018-05-19 DIAGNOSIS — F3181 Bipolar II disorder: Secondary | ICD-10-CM | POA: Diagnosis not present

## 2018-05-29 ENCOUNTER — Encounter: Payer: Self-pay | Admitting: Internal Medicine

## 2018-06-25 DIAGNOSIS — F3181 Bipolar II disorder: Secondary | ICD-10-CM | POA: Diagnosis not present

## 2018-07-07 ENCOUNTER — Ambulatory Visit (AMBULATORY_SURGERY_CENTER): Payer: Self-pay | Admitting: *Deleted

## 2018-07-07 VITALS — Ht 74.0 in | Wt 235.0 lb

## 2018-07-07 DIAGNOSIS — Z860101 Personal history of adenomatous and serrated colon polyps: Secondary | ICD-10-CM

## 2018-07-07 DIAGNOSIS — Z86010 Personal history of colonic polyps: Secondary | ICD-10-CM

## 2018-07-07 DIAGNOSIS — K5100 Ulcerative (chronic) pancolitis without complications: Secondary | ICD-10-CM

## 2018-07-07 NOTE — Progress Notes (Signed)
Patient denies any allergies to eggs or soy. Patient denies any problems with anesthesia/sedation. Patient denies any oxygen use at home. Patient denies taking any diet/weight loss medications or blood thinners. EMMI education offered, pt declined.

## 2018-07-08 ENCOUNTER — Encounter: Payer: Self-pay | Admitting: Internal Medicine

## 2018-07-21 ENCOUNTER — Ambulatory Visit (AMBULATORY_SURGERY_CENTER): Payer: BLUE CROSS/BLUE SHIELD | Admitting: Internal Medicine

## 2018-07-21 VITALS — BP 110/70 | HR 58 | Temp 98.6°F | Resp 18 | Ht 74.0 in | Wt 235.0 lb

## 2018-07-21 DIAGNOSIS — K51019 Ulcerative (chronic) pancolitis with unspecified complications: Secondary | ICD-10-CM

## 2018-07-21 DIAGNOSIS — Z86010 Personal history of colonic polyps: Secondary | ICD-10-CM | POA: Diagnosis not present

## 2018-07-21 LAB — HM COLONOSCOPY

## 2018-07-21 MED ORDER — SODIUM CHLORIDE 0.9 % IV SOLN: 500.0000 mL | Freq: Once | INTRAVENOUS | Status: DC

## 2018-07-21 NOTE — Op Note (Addendum)
[place] Patient Name: [name] Procedure Date: 07/21/2018 7:51 AM MRN: [id number] Endoscopist: Gatha Mayer , MD Age: 62 Referring MD:  Date of Birth: 1955-09-07 Gender: Male Account #: [id number] Procedure:                Colonoscopy Indications:              Surveillance: Personal history of adenomatous                            polyps on last colonoscopy 3 years ago, Personal                            history of ulcerative colitis Medicines:                Propofol per Anesthesia, Monitored Anesthesia Care Procedure:                Pre-Anesthesia Assessment:                           - Prior to the procedure, a History and Physical                            was performed, and patient medications and                            allergies were reviewed. The patient's tolerance of                            previous anesthesia was also reviewed. The risks                            and benefits of the procedure and the sedation                            options and risks were discussed with the patient.                            All questions were answered, and informed consent                            was obtained. Prior Anticoagulants: The patient has                            taken no previous anticoagulant or antiplatelet                            agents. ASA Grade Assessment: II - A patient with                            mild systemic disease. After reviewing the risks                            and benefits, the patient was deemed in  satisfactory condition to undergo the procedure.                           After obtaining informed consent, the colonoscope                            was passed under direct vision. Throughout the                            procedure, the patient's blood pressure, pulse, and                            oxygen saturations were monitored continuously. The   Colonoscope was introduced through the anus and                            advanced to the the cecum, identified by                            appendiceal orifice and ileocecal valve. The                            colonoscopy was somewhat difficult due to a                            redundant colon. Successful completion of the                            procedure was aided by applying abdominal pressure.                            The patient tolerated the procedure well. The                            quality of the bowel preparation was adequate. The                            bowel preparation used was Miralax. The ileocecal                            valve, appendiceal orifice, and rectum were                            photographed. Scope In: 7:59:03 AM Scope Out: 8:26:48 AM Scope Withdrawal Time: 0 hours 20 minutes 56 seconds  Total Procedure Duration: 0 hours 27 minutes 45 seconds  Findings:                 The perianal and digital rectal examinations were                            normal. Pertinent negatives include normal prostate                            (size, shape, and consistency).  Scattered diverticula were found in the left colon                            and right colon.                           Normal mucosa was found in the entire colon. Two                            biopsies were taken every 10 cm with a cold forceps                            from the rectum and entire colon for ulcerative                            colitis surveillance. These biopsy specimens from                            the cecum/asc, transverse, desc/sigmoid,                            sigmoid/rectum were sent to Pathology. Verification                            of patient identification for the specimen was                            done. Estimated blood loss was minimal.                           The retroflexed view of the distal rectum and anal                             verge was normal and showed no anal or rectal                            abnormalities. Complications:            No immediate complications. Estimated Blood Loss:     Estimated blood loss was minimal. Impression:               - Diverticulosis in the left colon and in the right                            colon.                           - Normal mucosa in the entire examined colon.                            Biopsied.                           - The distal rectum and anal verge are normal on  retroflexion view.                           - Personal history of 2 colonic polyps. ssp and                            adenoma 2016 Recommendation:           - Patient has a contact number available for                            emergencies. The signs and symptoms of potential                            delayed complications were discussed with the                            patient. Return to normal activities tomorrow.                            Written discharge instructions were provided to the                            patient.                           - Resume previous diet.                           - Continue present medications.                           - Repeat colonoscopy is recommended for                            surveillance. The colonoscopy date will be                            determined after pathology results from today's                            exam become available for review.                           NEEDS TO BETTER RESTRICT SEED CONSUMPTION PRIOR Gatha Mayer, MD 07/21/2018 8:37:41 AM This report has been signed electronically.

## 2018-07-21 NOTE — Progress Notes (Signed)
I have reviewed the patient's medical history in detail and updated the computerized patient record.

## 2018-07-21 NOTE — Progress Notes (Signed)
Report given to PACU, vss 

## 2018-07-21 NOTE — Progress Notes (Signed)
Called to room to assist during endoscopic procedure.  Patient ID and intended procedure confirmed with present staff. Received instructions for my participation in the procedure from the performing physician.  

## 2018-07-21 NOTE — Patient Instructions (Addendum)
No polyps today - had 2 in 2016.  Things look good - I took standard-routine biopsies to be sure colitis in remission.  I will let you know pathology results and when to have another routine colonoscopy by mail and/or My Chart.   I appreciate the opportunity to care for you. Gatha Mayer, MD, FACG YOU HAD AN ENDOSCOPIC PROCEDURE TODAY AT [facility]:   Refer to the procedure report that was given to you for any specific questions about what was found during the examination.  If the procedure report does not answer your questions, please call your gastroenterologist to clarify.  If you requested that your care partner not be given the details of your procedure findings, then the procedure report has been included in a sealed envelope for you to review at your convenience later.  YOU SHOULD EXPECT: Some feelings of bloating in the abdomen. Passage of more gas than usual.  Walking can help get rid of the air that was put into your GI tract during the procedure and reduce the bloating. If you had a lower endoscopy (such as a colonoscopy or flexible sigmoidoscopy) you may notice spotting of blood in your stool or on the toilet paper. If you underwent a bowel prep for your procedure, you may not have a normal bowel movement for a few days.  Please Note:  You might notice some irritation and congestion in your nose or some drainage.  This is from the oxygen used during your procedure.  There is no need for concern and it should clear up in a day or so.  SYMPTOMS TO REPORT IMMEDIATELY:   Following lower endoscopy (colonoscopy or flexible sigmoidoscopy):  Excessive amounts of blood in the stool  Significant tenderness or worsening of abdominal pains  Swelling of the abdomen that is new, acute  Fever of 100F or higher  For urgent or emergent issues, a gastroenterologist can be reached at any hour by calling [phone].   DIET:  We do recommend a small meal at first,  but then you may proceed to your regular diet.  Drink plenty of fluids but you should avoid alcoholic beverages for 24 hours.  MEDICATIONS: Continue present medications.  Please see handouts given to you by your recovery nurse.  ACTIVITY:  You should plan to take it easy for the rest of today and you should NOT DRIVE or use heavy machinery until tomorrow (because of the sedation medicines used during the test).    FOLLOW UP: Our staff will call the number listed on your records the next business day following your procedure to check on you and address any questions or concerns that you may have regarding the information given to you following your procedure. If we do not reach you, we will leave a message.  However, if you are feeling well and you are not experiencing any problems, there is no need to return our call.  We will assume that you have returned to your regular daily activities without incident.  If any biopsies were taken you will be contacted by phone or by letter within the next 1-3 weeks.  Please call us at [phone] if you have not heard about the biopsies in 3 weeks.   Thank you for allowing [place] to provide for your healthcare needs today.   SIGNATURES/CONFIDENTIALITY: You and/or your care partner have signed paperwork which will be entered into your electronic medical record.  These signatures attest to the fact that  that the information above on your After Visit Summary has been reviewed and is understood.  Full responsibility of the confidentiality of this discharge information lies with you and/or your care-partner.

## 2018-07-22 ENCOUNTER — Telehealth: Payer: Self-pay | Admitting: Internal Medicine

## 2018-07-22 ENCOUNTER — Encounter: Payer: Self-pay | Admitting: Internal Medicine

## 2018-07-22 ENCOUNTER — Telehealth: Payer: Self-pay

## 2018-07-22 NOTE — Telephone Encounter (Signed)
  Follow up Call-  Call back number 07/21/2018  Post procedure Call Back phone  # [phone]  Permission to leave phone message Yes  Some recent data might be hidden     Patient questions:  Do you have a fever, pain , or abdominal swelling? No. Pain Score  0 *  Have you tolerated food without any problems? Yes.    Have you been able to return to your normal activities? Yes.    Do you have any questions about your discharge instructions: Diet   No. Medications  No. Follow up visit  No.  Do you have questions or concerns about your Care? No.  Actions: * If pain score is 4 or above: No action needed, pain <4.

## 2018-07-22 NOTE — Telephone Encounter (Signed)
-----   Message from Beatris Si, RN sent at 07/22/2018  7:41 AM EST ----- Follow up call to pt this am - pt is doing fine.  Dr. Carlean Purl please call pt back in regard to his after visit summary page 3 it list ulcerative colitis with complications HCC.  I explained to pt he has a hx of uc and he has specific questions that I am unable to answer.  Please call pt at [phone].  Thank you.

## 2018-07-22 NOTE — Telephone Encounter (Signed)
This encounter was created in error - please disregard.

## 2018-07-22 NOTE — Telephone Encounter (Signed)
Left message that I was calling him back

## 2018-07-27 ENCOUNTER — Encounter: Payer: Self-pay | Admitting: Internal Medicine

## 2018-07-27 DIAGNOSIS — K5100 Ulcerative (chronic) pancolitis without complications: Secondary | ICD-10-CM

## 2018-07-28 LAB — HM COLONOSCOPY

## 2018-08-05 ENCOUNTER — Telehealth: Payer: Self-pay | Admitting: Internal Medicine

## 2018-08-05 NOTE — Telephone Encounter (Signed)
Have a letter the from the patient asking about the abbreviation [facility] that is attached to his diagnosis of ulcerative colitis.   He is concerned that this might represent hepatocellular carcinoma.   That is not what it represents, it represents a coding term called hierarchical comorbid condition.  Please contact him and explain this - sorry for any confusion but hard for [place] to control these printouts.

## 2018-08-06 NOTE — Telephone Encounter (Signed)
Left message for patient to call back  

## 2018-08-07 NOTE — Telephone Encounter (Signed)
Patient notified of response.  All questions answered. He will call back for any additional questions or concerns.

## 2018-08-22 DIAGNOSIS — F3181 Bipolar II disorder: Secondary | ICD-10-CM | POA: Diagnosis not present

## 2018-09-19 DIAGNOSIS — F3181 Bipolar II disorder: Secondary | ICD-10-CM | POA: Diagnosis not present

## 2018-09-24 ENCOUNTER — Encounter: Payer: BLUE CROSS/BLUE SHIELD | Admitting: Internal Medicine

## 2018-10-02 DIAGNOSIS — F3181 Bipolar II disorder: Secondary | ICD-10-CM | POA: Diagnosis not present

## 2018-10-02 DIAGNOSIS — F1020 Alcohol dependence, uncomplicated: Secondary | ICD-10-CM | POA: Diagnosis not present

## 2018-10-17 DIAGNOSIS — F3181 Bipolar II disorder: Secondary | ICD-10-CM | POA: Diagnosis not present

## 2018-10-29 ENCOUNTER — Other Ambulatory Visit: Payer: Self-pay | Admitting: Internal Medicine

## 2018-10-29 DIAGNOSIS — E7800 Pure hypercholesterolemia, unspecified: Secondary | ICD-10-CM

## 2018-10-30 ENCOUNTER — Other Ambulatory Visit: Payer: Self-pay | Admitting: Family

## 2018-10-30 DIAGNOSIS — F3181 Bipolar II disorder: Secondary | ICD-10-CM | POA: Diagnosis not present

## 2018-10-31 ENCOUNTER — Other Ambulatory Visit: Payer: Self-pay | Admitting: Internal Medicine

## 2018-10-31 DIAGNOSIS — E7800 Pure hypercholesterolemia, unspecified: Secondary | ICD-10-CM

## 2018-10-31 MED ORDER — ATORVASTATIN CALCIUM 80 MG PO TABS: 80.0000 mg | ORAL_TABLET | Freq: Every day | ORAL | 0 refills | Status: DC

## 2018-10-31 NOTE — Telephone Encounter (Signed)
Courtesy refill  

## 2018-10-31 NOTE — Telephone Encounter (Signed)
Copied from [place] [id number]. Topic: Quick Communication - Rx Refill/Question >> Oct 31, 2018  8:50 AM [name] wrote: Medication: atorvastatin (LIPITOR) 80 MG tablet  Has the patient contacted their pharmacy?Yes  Preferred Pharmacy (with phone number or street name): [facility] [address], [place] - [place] [phone] (Phone) [phone] (Fax)    Agent: Please be advised that RX refills may take up to 3 business days. We ask that you follow-up with your pharmacy.

## 2018-11-12 DIAGNOSIS — F3181 Bipolar II disorder: Secondary | ICD-10-CM | POA: Diagnosis not present

## 2018-11-19 ENCOUNTER — Encounter: Payer: BLUE CROSS/BLUE SHIELD | Admitting: Internal Medicine

## 2018-12-01 ENCOUNTER — Telehealth: Payer: Self-pay | Admitting: Internal Medicine

## 2018-12-01 ENCOUNTER — Other Ambulatory Visit: Payer: Self-pay | Admitting: Internal Medicine

## 2018-12-01 DIAGNOSIS — E7800 Pure hypercholesterolemia, unspecified: Secondary | ICD-10-CM

## 2018-12-01 MED ORDER — ATORVASTATIN CALCIUM 80 MG PO TABS: 80.0000 mg | ORAL_TABLET | Freq: Every day | ORAL | 0 refills | Status: DC

## 2018-12-01 NOTE — Telephone Encounter (Signed)
Copied from [place] [id number]. Topic: General - Other >> Dec 01, 2018  9:02 AM Carolyn Stare wrote:  Pt had a 12/03/2018 CPE but rescheduled till May and will need a refill until on the below med till his CPE   atorvastatin (LIPITOR) 80 MG tablet [name] [place]

## 2018-12-03 ENCOUNTER — Encounter: Payer: BLUE CROSS/BLUE SHIELD | Admitting: Internal Medicine

## 2018-12-30 DIAGNOSIS — D1801 Hemangioma of skin and subcutaneous tissue: Secondary | ICD-10-CM | POA: Diagnosis not present

## 2018-12-30 DIAGNOSIS — L812 Freckles: Secondary | ICD-10-CM | POA: Diagnosis not present

## 2018-12-30 DIAGNOSIS — L570 Actinic keratosis: Secondary | ICD-10-CM | POA: Diagnosis not present

## 2018-12-30 DIAGNOSIS — Z85828 Personal history of other malignant neoplasm of skin: Secondary | ICD-10-CM | POA: Diagnosis not present

## 2018-12-30 DIAGNOSIS — L821 Other seborrheic keratosis: Secondary | ICD-10-CM | POA: Diagnosis not present

## 2018-12-30 DIAGNOSIS — C44519 Basal cell carcinoma of skin of other part of trunk: Secondary | ICD-10-CM | POA: Diagnosis not present

## 2019-01-07 ENCOUNTER — Encounter: Payer: Self-pay | Admitting: Internal Medicine

## 2019-01-07 ENCOUNTER — Other Ambulatory Visit (INDEPENDENT_AMBULATORY_CARE_PROVIDER_SITE_OTHER): Payer: BLUE CROSS/BLUE SHIELD

## 2019-01-07 ENCOUNTER — Other Ambulatory Visit: Payer: Self-pay

## 2019-01-07 ENCOUNTER — Ambulatory Visit (INDEPENDENT_AMBULATORY_CARE_PROVIDER_SITE_OTHER): Payer: BLUE CROSS/BLUE SHIELD | Admitting: Internal Medicine

## 2019-01-07 VITALS — BP 148/96 | HR 62 | Temp 98.2°F | Resp 16 | Ht 74.0 in | Wt 233.0 lb

## 2019-01-07 DIAGNOSIS — I10 Essential (primary) hypertension: Secondary | ICD-10-CM | POA: Diagnosis not present

## 2019-01-07 DIAGNOSIS — Z0000 Encounter for general adult medical examination without abnormal findings: Secondary | ICD-10-CM

## 2019-01-07 DIAGNOSIS — R739 Hyperglycemia, unspecified: Secondary | ICD-10-CM

## 2019-01-07 DIAGNOSIS — Z23 Encounter for immunization: Secondary | ICD-10-CM | POA: Insufficient documentation

## 2019-01-07 DIAGNOSIS — Z125 Encounter for screening for malignant neoplasm of prostate: Secondary | ICD-10-CM | POA: Diagnosis not present

## 2019-01-07 DIAGNOSIS — E7800 Pure hypercholesterolemia, unspecified: Secondary | ICD-10-CM

## 2019-01-07 DIAGNOSIS — R7303 Prediabetes: Secondary | ICD-10-CM | POA: Insufficient documentation

## 2019-01-07 LAB — COMPREHENSIVE METABOLIC PANEL
ALT: 25 U/L (ref 0–53)
AST: 23 U/L (ref 0–37)
Albumin: 4.8 g/dL (ref 3.5–5.2)
Alkaline Phosphatase: 77 U/L (ref 39–117)
BUN: 15 mg/dL (ref 6–23)
CO2: 26 mEq/L (ref 19–32)
Calcium: 9.5 mg/dL (ref 8.4–10.5)
Chloride: 99 mEq/L (ref 96–112)
Creatinine, Ser: 0.78 mg/dL (ref 0.40–1.50)
GFR: 100.56 mL/min (ref >60.00)
Glucose, Bld: 101 mg/dL — ABNORMAL HIGH (ref 70–99)
Potassium: 4.2 mEq/L (ref 3.5–5.1)
Sodium: 134 mEq/L — ABNORMAL LOW (ref 135–145)
Total Bilirubin: 0.9 mg/dL (ref 0.2–1.2)
Total Protein: 7.1 g/dL (ref 6.0–8.3)

## 2019-01-07 LAB — CBC WITH DIFFERENTIAL/PLATELET
Basophils Absolute: 0.1 10*3/uL (ref 0.0–0.1)
Basophils Relative: 1 % (ref 0.0–3.0)
Eosinophils Absolute: 0.1 10*3/uL (ref 0.0–0.7)
Eosinophils Relative: 1.8 % (ref 0.0–5.0)
HCT: 42.5 % (ref 39.0–52.0)
Hemoglobin: 14.7 g/dL (ref 13.0–17.0)
Lymphocytes Relative: 28.7 % (ref 12.0–46.0)
Lymphs Abs: 1.9 10*3/uL (ref 0.7–4.0)
MCHC: 34.7 g/dL (ref 30.0–36.0)
MCV: 91.7 fl (ref 78.0–100.0)
Monocytes Absolute: 0.6 10*3/uL (ref 0.1–1.0)
Monocytes Relative: 8.9 % (ref 3.0–12.0)
Neutro Abs: 4 10*3/uL (ref 1.4–7.7)
Neutrophils Relative %: 59.6 % (ref 43.0–77.0)
Platelets: 226 10*3/uL (ref 150.0–400.0)
RBC: 4.63 Mil/uL (ref 4.22–5.81)
RDW: 12.8 % (ref 11.5–15.5)
WBC: 6.8 10*3/uL (ref 4.0–10.5)

## 2019-01-07 LAB — URINALYSIS, ROUTINE W REFLEX MICROSCOPIC
Bilirubin Urine: NEGATIVE
Hgb urine dipstick: NEGATIVE
Ketones, ur: NEGATIVE
Leukocytes,Ua: NEGATIVE
Nitrite: NEGATIVE
RBC / HPF: NONE SEEN (ref 0–?)
Specific Gravity, Urine: 1.02 (ref 1.000–1.030)
Total Protein, Urine: NEGATIVE
Urine Glucose: NEGATIVE
Urobilinogen, UA: 0.2 (ref 0.0–1.0)
WBC, UA: NONE SEEN (ref 0–?)
pH: 6.5 (ref 5.0–8.0)

## 2019-01-07 LAB — LIPID PANEL
Cholesterol: 155 mg/dL (ref 0–200)
HDL: 51.9 mg/dL (ref >39.00)
LDL Cholesterol: 77 mg/dL (ref 0–99)
NonHDL: 102.79
Total CHOL/HDL Ratio: 3
Triglycerides: 131 mg/dL (ref 0.0–149.0)
VLDL: 26.2 mg/dL (ref 0.0–40.0)

## 2019-01-07 LAB — VITAMIN D 25 HYDROXY (VIT D DEFICIENCY, FRACTURES): VITD: 35.65 ng/mL (ref 30.00–100.00)

## 2019-01-07 LAB — TSH: TSH: 1.22 u[IU]/mL (ref 0.35–4.50)

## 2019-01-07 LAB — HEMOGLOBIN A1C: Hgb A1c MFr Bld: 6 % (ref 4.6–6.5)

## 2019-01-07 LAB — PSA: PSA: 2.74 ng/mL (ref 0.10–4.00)

## 2019-01-07 MED ORDER — OLMESARTAN MEDOXOMIL 20 MG PO TABS: 20.0000 mg | ORAL_TABLET | Freq: Every day | ORAL | 0 refills | Status: DC

## 2019-01-07 NOTE — Patient Instructions (Signed)

## 2019-01-07 NOTE — Progress Notes (Signed)
Subjective:  Patient ID: [name], male    DOB: [birth year]  Age: 63 y.o. MRN: [id number]  CC: Annual Exam; Hyperlipidemia; and Hypertension   HPI [name] presents for a CPX.  He feels well today and offers no complaints.  He is very active and denies any recent episodes of CP, DOE, palpitations, edema, or fatigue. He is still going to [employer] but he admits that he drinks about 2 glasses of wine each day.  Outpatient Medications Prior to Visit  Medication Sig Dispense Refill   ampicillin (PRINCIPEN) 500 MG capsule Take 500 mg by mouth daily. Reported on 12/12/2015  1   atorvastatin (LIPITOR) 80 MG tablet Take 1 tablet (80 mg total) by mouth daily. 90 tablet 0   DULoxetine (CYMBALTA) 60 MG capsule Take 60 mg by mouth daily.     fluorouracil (EFUDEX) 5 % cream Reported on 12/04/2015  0   lamoTRIgine (LAMICTAL) 200 MG tablet   1   mesalamine (LIALDA) 1.2 g EC tablet Take 2 tablets (2.4 g total) by mouth daily. 34 tablet 0   Multiple Vitamin (MULTIVITAMIN) tablet Take 1 tablet by mouth daily.     Omega-3 Fatty Acids (FISH OIL) 1200 MG CAPS Take by mouth 2 (two) times daily.       QUEtiapine (SEROQUEL) 300 MG tablet      QUEtiapine (SEROQUEL) 400 MG tablet   1   No facility-administered medications prior to visit.     ROS Review of Systems  Constitutional: Negative for diaphoresis, fatigue and unexpected weight change.  HENT: Negative.  Negative for trouble swallowing.   Eyes: Negative.  Negative for visual disturbance.  Respiratory: Negative for cough, chest tightness, shortness of breath and wheezing.   Cardiovascular: Negative for chest pain, palpitations and leg swelling.  Gastrointestinal: Negative for abdominal pain, blood in stool, constipation, diarrhea, nausea and vomiting.  Endocrine: Negative.   Genitourinary: Negative.  Negative for difficulty urinating, scrotal swelling, testicular pain and urgency.  Musculoskeletal: Positive for arthralgias.  Negative for myalgias.  Skin: Negative.   Neurological: Negative.  Negative for dizziness, weakness and light-headedness.  Hematological: Negative for adenopathy. Does not bruise/bleed easily.  Psychiatric/Behavioral: Negative.     Objective:  BP (!) 148/96 (BP Location: Left Arm, Patient Position: Sitting, Cuff Size: Large)    Pulse 62    Temp 98.2 F (36.8 C) (Oral)    Resp 16    Ht 6' 2"  (1.88 m)    Wt 233 lb (105.7 kg)    SpO2 97%    BMI 29.92 kg/m   BP Readings from Last 3 Encounters:  01/07/19 (!) 148/96  07/21/18 110/70  02/21/18 110/70    Wt Readings from Last 3 Encounters:  01/07/19 233 lb (105.7 kg)  07/21/18 235 lb (106.6 kg)  07/07/18 235 lb (106.6 kg)    Physical Exam Vitals signs reviewed.  Constitutional:      Appearance: He is not ill-appearing or diaphoretic.  HENT:     Nose: Nose normal. No congestion or rhinorrhea.     Mouth/Throat:     Mouth: Mucous membranes are moist.     Pharynx: No oropharyngeal exudate.  Eyes:     General: No scleral icterus.    Conjunctiva/sclera: Conjunctivae normal.  Neck:     Musculoskeletal: Normal range of motion. No neck rigidity or muscular tenderness.  Cardiovascular:     Rate and Rhythm: Normal rate and regular rhythm.     Heart sounds: No murmur.  Comments: EKG ---  Sinus  Bradycardia  -Nonspecific QRS widening and anterior fascicular block.   ABNORMAL  Pulmonary:     Effort: Pulmonary effort is normal.     Breath sounds: No stridor. No wheezing, rhonchi or rales.  Abdominal:     General: Abdomen is protuberant.     Palpations: There is no hepatomegaly, splenomegaly or mass.     Tenderness: There is no abdominal tenderness. There is no guarding.     Hernia: No hernia is present.  Genitourinary:    Pubic Area: No rash.      Penis: Normal and circumcised.      Scrotum/Testes: Normal.        Right: Mass or tenderness not present.        Left: Mass or tenderness not present.     Epididymis:     Right:  Normal.     Left: Normal.     Prostate: Enlarged (1+ smooth symm BPH). Not tender and no nodules present.     Rectum: Normal. Guaiac result negative. No mass, tenderness, anal fissure, external hemorrhoid or internal hemorrhoid. Normal anal tone.  Musculoskeletal: Normal range of motion.        General: No swelling.     Right lower leg: No edema.     Left lower leg: No edema.  Lymphadenopathy:     Cervical: No cervical adenopathy.  Skin:    General: Skin is warm and dry.  Neurological:     General: No focal deficit present.  Psychiatric:        Mood and Affect: Mood normal.        Behavior: Behavior normal.     Lab Results  Component Value Date   WBC 6.8 01/07/2019   HGB 14.7 01/07/2019   HCT 42.5 01/07/2019   PLT 226.0 01/07/2019   GLUCOSE 101 (H) 01/07/2019   CHOL 155 01/07/2019   TRIG 131.0 01/07/2019   HDL 51.90 01/07/2019   LDLDIRECT 102.0 09/09/2017   LDLCALC 77 01/07/2019   ALT 25 01/07/2019   AST 23 01/07/2019   NA 134 (L) 01/07/2019   K 4.2 01/07/2019   CL 99 01/07/2019   CREATININE 0.78 01/07/2019   BUN 15 01/07/2019   CO2 26 01/07/2019   TSH 1.22 01/07/2019   PSA 2.74 01/07/2019   INR 0.9 03/08/2008   HGBA1C 6.0 01/07/2019    Dg Chest 2 View  Result Date: 09/21/2013 CLINICAL DATA:  Cough EXAM: CHEST - 2 VIEW COMPARISON:  03/08/2008 FINDINGS: Lungs are clear. Heart size and mediastinal contours are within normal limits. No effusion. Visualized skeletal structures are unremarkable. IMPRESSION: No acute cardiopulmonary disease. Electronically Signed   By: Arne Cleveland M.D.   On: 09/21/2013 14:24    Assessment & Plan:   [name] was seen today for annual exam, hyperlipidemia and hypertension.  Diagnoses and all orders for this visit:  Routine general medical examination at a health care facility- Exam completed, labs reviewed, vaccines reviewed and updated, colon cancer screening is up-to-date, patient education material was given. -     Lipid panel;  Future -     PSA; Future  Pure hypercholesterolemia- He has achieved his LDL goal and is doing well on the statin. -     CBC with Differential/Platelet; Future -     Comprehensive metabolic panel; Future -     TSH; Future  Hyperglycemia- His A1c is at 6.0%.  He is prediabetic.  Medical therapy is not indicated.  I encouraged him  to work on his lifestyle modifications. -     CBC with Differential/Platelet; Future -     Comprehensive metabolic panel; Future -     Hemoglobin A1c; Future  Need for Tdap vaccination -     Cancel: Tdap vaccine greater than or equal to 7yo IM -     Tdap vaccine greater than or equal to 7yo IM  Essential hypertension- He has developed stage I hypertension.  His EKG is negative for ischemia or LVH.  His labs are negative for secondary causes or endorgan damage.  In addition to lifestyle modifications I have asked him to start taking an ARB. -     Urinalysis, Routine w reflex microscopic; Future -     VITAMIN D 25 Hydroxy (Vit-D Deficiency, Fractures); Future -     EKG 12-Lead -     Discontinue: olmesartan (BENICAR) 20 MG tablet; Take 1 tablet (20 mg total) by mouth daily. -     olmesartan (BENICAR) 20 MG tablet; Take 1 tablet (20 mg total) by mouth daily.   I am having [name] maintain his Fish Oil, DULoxetine, ampicillin, lamoTRIgine, fluorouracil, multivitamin, mesalamine, atorvastatin, QUEtiapine, and olmesartan.  Meds ordered this encounter  Medications   DISCONTD: olmesartan (BENICAR) 20 MG tablet    Sig: Take 1 tablet (20 mg total) by mouth daily.    Dispense:  90 tablet    Refill:  0   olmesartan (BENICAR) 20 MG tablet    Sig: Take 1 tablet (20 mg total) by mouth daily.    Dispense:  90 tablet    Refill:  0     Follow-up: Return in about 6 weeks (around 02/18/2019).  [name], MD

## 2019-01-12 ENCOUNTER — Telehealth: Payer: Self-pay | Admitting: Internal Medicine

## 2019-01-12 NOTE — Telephone Encounter (Signed)
Informed patient that letter was probably mailed out to patient on Friday.  Informed patient we would prefer mail at this time but if patient needed results for today we could allow him to come pick up.

## 2019-01-12 NOTE — Telephone Encounter (Signed)
Copied from [place] [id number]. Topic: Quick Communication - See Telephone Encounter >> Jan 12, 2019  9:01 AM Sheran Luz wrote: CRM for notification. See Telephone encounter for: 01/12/19.   Patient called to inquire if he can pick up 5/6 lab results from office today. Please advise.

## 2019-01-14 NOTE — Telephone Encounter (Signed)
Pt returning call to nurse and stated the email on file is the correct email to sent his labs.

## 2019-01-14 NOTE — Telephone Encounter (Signed)
Pt called stating that he has not received his labs via Mail. Pt would like to come pick copy up in office but was advised that we are not having pt come into the office at the time unless its for a visit. Please contact pt or remail copy of results.

## 2019-01-14 NOTE — Telephone Encounter (Signed)
lvm for pt informing that I can email if needed.

## 2019-01-14 NOTE — Telephone Encounter (Signed)
Letter has been emailed to pt as requested.

## 2019-01-27 ENCOUNTER — Telehealth: Payer: Self-pay | Admitting: Internal Medicine

## 2019-01-27 MED ORDER — MESALAMINE 1.2 G PO TBEC: 2.4000 g | DELAYED_RELEASE_TABLET | Freq: Every day | ORAL | 1 refills | Status: DC

## 2019-01-27 NOTE — Telephone Encounter (Signed)
Patient up to date on his visits, lialda refilled as requested.

## 2019-01-27 NOTE — Telephone Encounter (Signed)
Pt requested a refill for Lialda sent to [facility].

## 2019-01-30 ENCOUNTER — Telehealth: Payer: Self-pay | Admitting: Internal Medicine

## 2019-01-30 MED ORDER — MESALAMINE 1.2 G PO TBEC: 2.4000 g | DELAYED_RELEASE_TABLET | Freq: Every day | ORAL | 1 refills | Status: DC

## 2019-01-30 NOTE — Telephone Encounter (Signed)
Left message that the Doristine Johns has been faxed to [name] as requested.

## 2019-01-30 NOTE — Telephone Encounter (Signed)
Pt requested prescription for Lialda faxed to [place] in [place] fax: [phone]

## 2019-02-11 DIAGNOSIS — C44519 Basal cell carcinoma of skin of other part of trunk: Secondary | ICD-10-CM | POA: Diagnosis not present

## 2019-02-20 DIAGNOSIS — F3181 Bipolar II disorder: Secondary | ICD-10-CM | POA: Diagnosis not present

## 2019-02-25 ENCOUNTER — Ambulatory Visit: Payer: BLUE CROSS/BLUE SHIELD | Admitting: Internal Medicine

## 2019-02-26 ENCOUNTER — Other Ambulatory Visit: Payer: Self-pay | Admitting: Internal Medicine

## 2019-02-26 DIAGNOSIS — E7800 Pure hypercholesterolemia, unspecified: Secondary | ICD-10-CM

## 2019-03-03 ENCOUNTER — Telehealth: Payer: Self-pay | Admitting: Internal Medicine

## 2019-03-03 NOTE — Telephone Encounter (Signed)
Patient called back to complete his screening for his appointment scheduled for tomorrow.  Patient states that he noticed on Sunday after a walk he felt tightness in his chest and a slight cough. He says that it seemed worse yesterday and is still continuing today. His follow up appointment has been reschedule.

## 2019-03-04 ENCOUNTER — Ambulatory Visit: Payer: BC Managed Care – PPO | Admitting: Internal Medicine

## 2019-03-11 ENCOUNTER — Ambulatory Visit (INDEPENDENT_AMBULATORY_CARE_PROVIDER_SITE_OTHER): Payer: BC Managed Care – PPO | Admitting: Internal Medicine

## 2019-03-11 ENCOUNTER — Encounter: Payer: Self-pay | Admitting: Internal Medicine

## 2019-03-11 ENCOUNTER — Other Ambulatory Visit: Payer: Self-pay

## 2019-03-11 VITALS — BP 142/88 | HR 74 | Temp 98.4°F | Resp 16 | Ht 74.0 in | Wt 225.0 lb

## 2019-03-11 DIAGNOSIS — I10 Essential (primary) hypertension: Secondary | ICD-10-CM

## 2019-03-11 DIAGNOSIS — E7800 Pure hypercholesterolemia, unspecified: Secondary | ICD-10-CM

## 2019-03-11 DIAGNOSIS — E785 Hyperlipidemia, unspecified: Secondary | ICD-10-CM | POA: Diagnosis not present

## 2019-03-11 MED ORDER — ATORVASTATIN CALCIUM 80 MG PO TABS: 80.0000 mg | ORAL_TABLET | Freq: Every day | ORAL | 1 refills | Status: DC

## 2019-03-11 MED ORDER — OLMESARTAN MEDOXOMIL 40 MG PO TABS: 40.0000 mg | ORAL_TABLET | Freq: Every day | ORAL | 1 refills | Status: DC

## 2019-03-11 NOTE — Patient Instructions (Signed)

## 2019-03-11 NOTE — Progress Notes (Signed)
Subjective:  Patient ID: [name], male    DOB: Jan 02, 1956  Age: 63 y.o. MRN: [id number]  CC: Hypertension and Hyperlipidemia   HPI [name] presents for f/up - He tells me his blood pressure has been much better.  He has started a walking program and has been able to lose weight with lifestyle modifications.  He denies any recent episodes of headache, blurred vision, CP, DOE, palpitations, edema, or fatigue.  He is tolerating the cholesterol medicine well with no muscle or joint aches.  Outpatient Medications Prior to Visit  Medication Sig Dispense Refill  . DULoxetine (CYMBALTA) 60 MG capsule Take 60 mg by mouth daily.    Marland Kitchen lamoTRIgine (LAMICTAL) 200 MG tablet   1  . mesalamine (LIALDA) 1.2 g EC tablet Take 2 tablets (2.4 g total) by mouth daily. 180 tablet 1  . Multiple Vitamin (MULTIVITAMIN) tablet Take 1 tablet by mouth daily.    . Omega-3 Fatty Acids (FISH OIL) 1200 MG CAPS Take by mouth 2 (two) times daily.      . QUEtiapine (SEROQUEL) 300 MG tablet     . atorvastatin (LIPITOR) 80 MG tablet TAKE ONE TABLET BY MOUTH DAILY 90 tablet 0  . olmesartan (BENICAR) 20 MG tablet Take 1 tablet (20 mg total) by mouth daily. 90 tablet 0  . ampicillin (PRINCIPEN) 500 MG capsule Take 500 mg by mouth daily. Reported on 12/12/2015  1  . fluorouracil (EFUDEX) 5 % cream Reported on 12/04/2015  0   No facility-administered medications prior to visit.     ROS Review of Systems  Constitutional: Negative.  Negative for diaphoresis, fatigue and unexpected weight change.  HENT: Negative.   Eyes: Negative for visual disturbance.  Respiratory: Negative for cough, chest tightness, shortness of breath and wheezing.   Cardiovascular: Negative for chest pain, palpitations and leg swelling.  Gastrointestinal: Negative for abdominal pain, constipation, diarrhea, nausea and vomiting.  Endocrine: Negative.   Genitourinary: Negative.  Negative for difficulty urinating and dysuria.  Musculoskeletal:  Negative.  Negative for arthralgias and myalgias.  Skin: Negative.  Negative for color change and pallor.  Neurological: Negative.  Negative for dizziness, weakness and light-headedness.  Hematological: Negative for adenopathy. Does not bruise/bleed easily.  Psychiatric/Behavioral: Negative.     Objective:  BP (!) 142/88 (BP Location: Left Arm, Patient Position: Sitting, Cuff Size: Large)   Pulse 74   Temp 98.4 F (36.9 C) (Oral)   Resp 16   Ht 6' 2"  (1.88 m)   Wt 225 lb (102.1 kg)   SpO2 95%   BMI 28.89 kg/m   BP Readings from Last 3 Encounters:  03/11/19 (!) 142/88  01/07/19 (!) 148/96  07/21/18 110/70    Wt Readings from Last 3 Encounters:  03/11/19 225 lb (102.1 kg)  01/07/19 233 lb (105.7 kg)  07/21/18 235 lb (106.6 kg)    Physical Exam Vitals signs reviewed.  HENT:     Nose: Nose normal.     Mouth/Throat:     Mouth: Mucous membranes are moist.  Eyes:     General: No scleral icterus.    Conjunctiva/sclera: Conjunctivae normal.  Neck:     Musculoskeletal: Normal range of motion. No neck rigidity.  Cardiovascular:     Rate and Rhythm: Normal rate and regular rhythm.     Heart sounds: No murmur.  Pulmonary:     Effort: Pulmonary effort is normal.     Breath sounds: No stridor. No wheezing, rhonchi or rales.  Abdominal:  General: Abdomen is flat. Bowel sounds are normal. There is no distension.     Palpations: There is no hepatomegaly or splenomegaly.     Tenderness: There is no abdominal tenderness.  Musculoskeletal: Normal range of motion.     Right lower leg: No edema.     Left lower leg: No edema.  Skin:    General: Skin is warm and dry.  Neurological:     General: No focal deficit present.     Lab Results  Component Value Date   WBC 6.8 01/07/2019   HGB 14.7 01/07/2019   HCT 42.5 01/07/2019   PLT 226.0 01/07/2019   GLUCOSE 101 (H) 01/07/2019   CHOL 155 01/07/2019   TRIG 131.0 01/07/2019   HDL 51.90 01/07/2019   LDLDIRECT 102.0  09/09/2017   LDLCALC 77 01/07/2019   ALT 25 01/07/2019   AST 23 01/07/2019   NA 134 (L) 01/07/2019   K 4.2 01/07/2019   CL 99 01/07/2019   CREATININE 0.78 01/07/2019   BUN 15 01/07/2019   CO2 26 01/07/2019   TSH 1.22 01/07/2019   PSA 2.74 01/07/2019   INR 0.9 03/08/2008   HGBA1C 6.0 01/07/2019    Dg Chest 2 View  Result Date: 09/21/2013 CLINICAL DATA:  Cough EXAM: CHEST - 2 VIEW COMPARISON:  03/08/2008 FINDINGS: Lungs are clear. Heart size and mediastinal contours are within normal limits. No effusion. Visualized skeletal structures are unremarkable. IMPRESSION: No acute cardiopulmonary disease. Electronically Signed   By: Arne Cleveland M.D.   On: 09/21/2013 14:24    Assessment & Plan:   Call[place]ay was seen today for hypertension and hyperlipidemia.  Diagnoses and all orders for this visit:  Essential hypertension- His blood pressure is better but still not adequately well controlled.  I praised him for his lifestyle modifications and asked him to double the dose of his ARB.  I will monitor his electrolytes and renal function. -     Basic metabolic panel; Future -     olmesartan (BENICAR) 40 MG tablet; Take 1 tablet (40 mg total) by mouth daily.  Dyslipidemia, goal LDL below 100- He has achieved his LDL goal and is doing well on the statin. -     atorvastatin (LIPITOR) 80 MG tablet; Take 1 tablet (80 mg total) by mouth daily.  Pure hypercholesterolemia   I have discontinued [name]'s ampicillin, fluorouracil, and olmesartan. I have also changed his atorvastatin. Additionally, I am having him start on olmesartan. Lastly, I am having him maintain his Fish Oil, DULoxetine, lamoTRIgine, multivitamin, QUEtiapine, and mesalamine.  Meds ordered this encounter  Medications  . olmesartan (BENICAR) 40 MG tablet    Sig: Take 1 tablet (40 mg total) by mouth daily.    Dispense:  90 tablet    Refill:  1  . atorvastatin (LIPITOR) 80 MG tablet    Sig: Take 1 tablet (80 mg total) by  mouth daily.    Dispense:  90 tablet    Refill:  1     Follow-up: Return in about 6 months (around 09/11/2019).  Phillip Calico, MD

## 2019-03-26 ENCOUNTER — Other Ambulatory Visit: Payer: Self-pay | Admitting: Internal Medicine

## 2019-03-26 DIAGNOSIS — I10 Essential (primary) hypertension: Secondary | ICD-10-CM

## 2019-04-02 DIAGNOSIS — F1020 Alcohol dependence, uncomplicated: Secondary | ICD-10-CM | POA: Diagnosis not present

## 2019-04-02 DIAGNOSIS — F3181 Bipolar II disorder: Secondary | ICD-10-CM | POA: Diagnosis not present

## 2019-04-21 DIAGNOSIS — F3181 Bipolar II disorder: Secondary | ICD-10-CM | POA: Diagnosis not present

## 2019-04-28 ENCOUNTER — Other Ambulatory Visit: Payer: Self-pay | Admitting: Internal Medicine

## 2019-04-28 ENCOUNTER — Telehealth: Payer: Self-pay | Admitting: Internal Medicine

## 2019-04-28 DIAGNOSIS — I10 Essential (primary) hypertension: Secondary | ICD-10-CM

## 2019-04-29 ENCOUNTER — Other Ambulatory Visit: Payer: Self-pay | Admitting: Internal Medicine

## 2019-04-29 NOTE — Telephone Encounter (Signed)
LVM for pt to call back as soon as possible.   

## 2019-04-29 NOTE — Telephone Encounter (Addendum)
Relation to pt: self  Call back number: [phone]  Pharmacy: [id number] [place]  Reason for call:  Pharamacy informed patient PCP denied olmesartan (BENICAR) 40 MG tablet, patient states PCP increased frequency too 2 per day, patient states he has a few pills left but would like a call back today from PCP nurse regarding why Rx was denied, please advise

## 2019-04-29 NOTE — Telephone Encounter (Signed)
Please confirmed sig for olmesartan - 1 tab daily or 1 tab bid?   If the frequency was increased, can you send a new script in to [facility].

## 2019-04-29 NOTE — Telephone Encounter (Signed)
The prescription is written for olmesartan 40 mg once a day.  If he has been taking 40 mg twice a day then he needs to come in to have his blood pressure and his kidney function checked.  TJ

## 2019-04-30 NOTE — Telephone Encounter (Signed)
Pt dropped off a letter today requesting a call back.   LVM with details and PCP comment from below.   Pt may need to schedule an appointment.

## 2019-05-25 ENCOUNTER — Other Ambulatory Visit: Payer: Self-pay | Admitting: Internal Medicine

## 2019-05-25 DIAGNOSIS — I10 Essential (primary) hypertension: Secondary | ICD-10-CM

## 2019-05-25 DIAGNOSIS — E785 Hyperlipidemia, unspecified: Secondary | ICD-10-CM

## 2019-05-25 MED ORDER — ATORVASTATIN CALCIUM 80 MG PO TABS: 80.0000 mg | ORAL_TABLET | Freq: Every day | ORAL | 1 refills | Status: DC

## 2019-05-25 MED ORDER — OLMESARTAN MEDOXOMIL 40 MG PO TABS: 40.0000 mg | ORAL_TABLET | Freq: Every day | ORAL | 1 refills | Status: DC

## 2019-05-27 ENCOUNTER — Other Ambulatory Visit: Payer: Self-pay | Admitting: Internal Medicine

## 2019-05-27 DIAGNOSIS — I10 Essential (primary) hypertension: Secondary | ICD-10-CM

## 2019-06-30 DIAGNOSIS — F3181 Bipolar II disorder: Secondary | ICD-10-CM | POA: Diagnosis not present

## 2019-07-26 ENCOUNTER — Other Ambulatory Visit: Payer: Self-pay | Admitting: Internal Medicine

## 2019-07-26 DIAGNOSIS — I10 Essential (primary) hypertension: Secondary | ICD-10-CM

## 2019-07-27 DIAGNOSIS — F3181 Bipolar II disorder: Secondary | ICD-10-CM | POA: Diagnosis not present

## 2019-09-04 HISTORY — PX: QUADRICEPS TENDON REPAIR: SHX756

## 2019-09-10 ENCOUNTER — Encounter: Payer: Self-pay | Admitting: Internal Medicine

## 2019-09-13 ENCOUNTER — Encounter: Payer: Self-pay | Admitting: Internal Medicine

## 2019-09-14 ENCOUNTER — Ambulatory Visit: Payer: BC Managed Care – PPO | Admitting: Internal Medicine

## 2019-09-15 ENCOUNTER — Encounter: Payer: Self-pay | Admitting: Internal Medicine

## 2019-09-24 DIAGNOSIS — F3181 Bipolar II disorder: Secondary | ICD-10-CM | POA: Diagnosis not present

## 2019-09-24 DIAGNOSIS — F1020 Alcohol dependence, uncomplicated: Secondary | ICD-10-CM | POA: Diagnosis not present

## 2019-09-25 DIAGNOSIS — F3181 Bipolar II disorder: Secondary | ICD-10-CM | POA: Diagnosis not present

## 2019-09-28 ENCOUNTER — Encounter: Payer: Self-pay | Admitting: Internal Medicine

## 2019-09-28 DIAGNOSIS — I10 Essential (primary) hypertension: Secondary | ICD-10-CM

## 2019-09-28 DIAGNOSIS — E785 Hyperlipidemia, unspecified: Secondary | ICD-10-CM

## 2019-10-01 ENCOUNTER — Other Ambulatory Visit: Payer: Self-pay

## 2019-10-01 MED ORDER — MESALAMINE 1.2 G PO TBEC: 2.4000 g | DELAYED_RELEASE_TABLET | Freq: Every day | ORAL | 1 refills | Status: DC

## 2019-10-01 NOTE — Telephone Encounter (Signed)
Mesalamine refilled, the rx was faxed to [facility] at [phone]. Note attached to call for appointment.

## 2019-10-15 ENCOUNTER — Other Ambulatory Visit: Payer: Self-pay | Admitting: Internal Medicine

## 2019-10-15 DIAGNOSIS — R3911 Hesitancy of micturition: Secondary | ICD-10-CM

## 2019-10-15 DIAGNOSIS — N401 Enlarged prostate with lower urinary tract symptoms: Secondary | ICD-10-CM | POA: Insufficient documentation

## 2019-10-15 MED ORDER — ALFUZOSIN HCL ER 10 MG PO TB24: 10.0000 mg | ORAL_TABLET | Freq: Every day | ORAL | 0 refills | Status: DC

## 2019-10-15 MED ORDER — DUTASTERIDE 0.5 MG PO CAPS: 0.5000 mg | ORAL_CAPSULE | Freq: Every day | ORAL | 0 refills | Status: DC

## 2019-10-16 MED ORDER — ALFUZOSIN HCL ER 10 MG PO TB24: 10.0000 mg | ORAL_TABLET | Freq: Every day | ORAL | 0 refills | Status: DC

## 2019-10-16 MED ORDER — DUTASTERIDE 0.5 MG PO CAPS: 0.5000 mg | ORAL_CAPSULE | Freq: Every day | ORAL | 0 refills | Status: DC

## 2019-10-16 MED ORDER — OLMESARTAN MEDOXOMIL 40 MG PO TABS: 40.0000 mg | ORAL_TABLET | Freq: Every day | ORAL | 1 refills | Status: DC

## 2019-10-16 MED ORDER — ATORVASTATIN CALCIUM 80 MG PO TABS: 80.0000 mg | ORAL_TABLET | Freq: Every day | ORAL | 1 refills | Status: DC

## 2019-11-20 ENCOUNTER — Ambulatory Visit: Payer: BC Managed Care – PPO

## 2019-11-23 DIAGNOSIS — F3181 Bipolar II disorder: Secondary | ICD-10-CM | POA: Diagnosis not present

## 2019-12-02 ENCOUNTER — Ambulatory Visit: Payer: BC Managed Care – PPO | Attending: Internal Medicine

## 2019-12-02 DIAGNOSIS — Z20822 Contact with and (suspected) exposure to covid-19: Secondary | ICD-10-CM | POA: Diagnosis not present

## 2019-12-03 LAB — NOVEL CORONAVIRUS, NAA: SARS-CoV-2, NAA: NOT DETECTED

## 2019-12-30 DIAGNOSIS — L812 Freckles: Secondary | ICD-10-CM | POA: Diagnosis not present

## 2019-12-30 DIAGNOSIS — Z85828 Personal history of other malignant neoplasm of skin: Secondary | ICD-10-CM | POA: Diagnosis not present

## 2019-12-30 DIAGNOSIS — C4441 Basal cell carcinoma of skin of scalp and neck: Secondary | ICD-10-CM | POA: Diagnosis not present

## 2019-12-30 DIAGNOSIS — L821 Other seborrheic keratosis: Secondary | ICD-10-CM | POA: Diagnosis not present

## 2019-12-30 DIAGNOSIS — D0471 Carcinoma in situ of skin of right lower limb, including hip: Secondary | ICD-10-CM | POA: Diagnosis not present

## 2019-12-30 DIAGNOSIS — L570 Actinic keratosis: Secondary | ICD-10-CM | POA: Diagnosis not present

## 2020-01-11 ENCOUNTER — Other Ambulatory Visit: Payer: Self-pay | Admitting: Internal Medicine

## 2020-01-11 DIAGNOSIS — N401 Enlarged prostate with lower urinary tract symptoms: Secondary | ICD-10-CM

## 2020-01-11 DIAGNOSIS — R3911 Hesitancy of micturition: Secondary | ICD-10-CM

## 2020-01-20 ENCOUNTER — Encounter: Payer: BC Managed Care – PPO | Admitting: Internal Medicine

## 2020-02-03 ENCOUNTER — Ambulatory Visit (INDEPENDENT_AMBULATORY_CARE_PROVIDER_SITE_OTHER): Payer: BC Managed Care – PPO | Admitting: Internal Medicine

## 2020-02-03 ENCOUNTER — Encounter: Payer: Self-pay | Admitting: Internal Medicine

## 2020-02-03 ENCOUNTER — Other Ambulatory Visit: Payer: Self-pay

## 2020-02-03 VITALS — BP 156/82 | HR 78 | Temp 97.9°F | Resp 16 | Ht 74.0 in | Wt 232.0 lb

## 2020-02-03 DIAGNOSIS — Z125 Encounter for screening for malignant neoplasm of prostate: Secondary | ICD-10-CM | POA: Diagnosis not present

## 2020-02-03 DIAGNOSIS — R739 Hyperglycemia, unspecified: Secondary | ICD-10-CM

## 2020-02-03 DIAGNOSIS — E785 Hyperlipidemia, unspecified: Secondary | ICD-10-CM

## 2020-02-03 DIAGNOSIS — I10 Essential (primary) hypertension: Secondary | ICD-10-CM

## 2020-02-03 DIAGNOSIS — I493 Ventricular premature depolarization: Secondary | ICD-10-CM | POA: Diagnosis not present

## 2020-02-03 DIAGNOSIS — Z0000 Encounter for general adult medical examination without abnormal findings: Secondary | ICD-10-CM | POA: Diagnosis not present

## 2020-02-03 LAB — URINALYSIS, ROUTINE W REFLEX MICROSCOPIC
Bilirubin Urine: NEGATIVE
Hgb urine dipstick: NEGATIVE
Ketones, ur: NEGATIVE
Leukocytes,Ua: NEGATIVE
Nitrite: NEGATIVE
Specific Gravity, Urine: 1.015 (ref 1.000–1.030)
Total Protein, Urine: NEGATIVE
Urine Glucose: NEGATIVE
Urobilinogen, UA: 0.2 (ref 0.0–1.0)
pH: 7 (ref 5.0–8.0)

## 2020-02-03 LAB — BASIC METABOLIC PANEL
BUN: 12 mg/dL (ref 6–23)
CO2: 27 mEq/L (ref 19–32)
Calcium: 9.6 mg/dL (ref 8.4–10.5)
Chloride: 99 mEq/L (ref 96–112)
Creatinine, Ser: 0.87 mg/dL (ref 0.40–1.50)
GFR: 88.35 mL/min (ref >60.00)
Glucose, Bld: 101 mg/dL — ABNORMAL HIGH (ref 70–99)
Potassium: 4 mEq/L (ref 3.5–5.1)
Sodium: 132 mEq/L — ABNORMAL LOW (ref 135–145)

## 2020-02-03 LAB — PSA: PSA: 2.04 ng/mL (ref 0.10–4.00)

## 2020-02-03 LAB — CBC WITH DIFFERENTIAL/PLATELET
Basophils Absolute: 0 10*3/uL (ref 0.0–0.1)
Basophils Relative: 0.7 % (ref 0.0–3.0)
Eosinophils Absolute: 0.1 10*3/uL (ref 0.0–0.7)
Eosinophils Relative: 0.9 % (ref 0.0–5.0)
HCT: 38 % — ABNORMAL LOW (ref 39.0–52.0)
Hemoglobin: 13.2 g/dL (ref 13.0–17.0)
Lymphocytes Relative: 23.9 % (ref 12.0–46.0)
Lymphs Abs: 1.5 10*3/uL (ref 0.7–4.0)
MCHC: 34.8 g/dL (ref 30.0–36.0)
MCV: 91.9 fl (ref 78.0–100.0)
Monocytes Absolute: 0.5 10*3/uL (ref 0.1–1.0)
Monocytes Relative: 8.7 % (ref 3.0–12.0)
Neutro Abs: 4.2 10*3/uL (ref 1.4–7.7)
Neutrophils Relative %: 65.8 % (ref 43.0–77.0)
Platelets: 254 10*3/uL (ref 150.0–400.0)
RBC: 4.14 Mil/uL — ABNORMAL LOW (ref 4.22–5.81)
RDW: 12.4 % (ref 11.5–15.5)
WBC: 6.3 10*3/uL (ref 4.0–10.5)

## 2020-02-03 LAB — HEPATIC FUNCTION PANEL
ALT: 26 U/L (ref 0–53)
AST: 26 U/L (ref 0–37)
Albumin: 4.7 g/dL (ref 3.5–5.2)
Alkaline Phosphatase: 59 U/L (ref 39–117)
Bilirubin, Direct: 0.2 mg/dL (ref 0.0–0.3)
Total Bilirubin: 0.9 mg/dL (ref 0.2–1.2)
Total Protein: 6.8 g/dL (ref 6.0–8.3)

## 2020-02-03 LAB — LIPID PANEL
Cholesterol: 126 mg/dL (ref 0–200)
HDL: 44.9 mg/dL (ref >39.00)
LDL Cholesterol: 65 mg/dL (ref 0–99)
NonHDL: 81.09
Total CHOL/HDL Ratio: 3
Triglycerides: 81 mg/dL (ref 0.0–149.0)
VLDL: 16.2 mg/dL (ref 0.0–40.0)

## 2020-02-03 LAB — MAGNESIUM: Magnesium: 1.9 mg/dL (ref 1.5–2.5)

## 2020-02-03 LAB — TSH: TSH: 0.79 u[IU]/mL (ref 0.35–4.50)

## 2020-02-03 LAB — HEMOGLOBIN A1C: Hgb A1c MFr Bld: 6.2 % (ref 4.6–6.5)

## 2020-02-03 NOTE — Patient Instructions (Signed)

## 2020-02-03 NOTE — Progress Notes (Signed)
Subjective:  Patient ID: [name], male    DOB: 10-28-1955  Age: 64 y.o. MRN: [id number]  CC: Hypertension, Hyperlipidemia, and Annual Exam  This visit occurred during the SARS-CoV-2 public health emergency.  Safety protocols were in place, including screening questions prior to the visit, additional usage of staff PPE, and extensive cleaning of exam room while observing appropriate contact time as indicated for disinfecting solutions.    HPI [name] presents for a CPX.  He does not monitor his blood pressure.  He denies any recent episodes of headache, blurred vision, chest pain, shortness of breath, or edema.  He is active and does not experience DOE.  He complains of weight gain.   Outpatient Medications Prior to Visit  Medication Sig Dispense Refill   alfuzosin (UROXATRAL) 10 MG 24 hr tablet TAKE ONE TABLET BY MOUTH DAILY WITH BREAKFAST 90 tablet 0   atorvastatin (LIPITOR) 80 MG tablet Take 1 tablet (80 mg total) by mouth daily. 90 tablet 1   DULoxetine (CYMBALTA) 60 MG capsule Take 60 mg by mouth daily.     dutasteride (AVODART) 0.5 MG capsule TAKE ONE CAPSULE BY MOUTH DAILY 90 capsule 0   lamoTRIgine (LAMICTAL) 200 MG tablet   1   mesalamine (LIALDA) 1.2 g EC tablet Take 2 tablets (2.4 g total) by mouth daily. 180 tablet 1   Multiple Vitamin (MULTIVITAMIN) tablet Take 1 tablet by mouth daily.     olmesartan (BENICAR) 40 MG tablet Take 1 tablet (40 mg total) by mouth daily. 90 tablet 1   Omega-3 Fatty Acids (FISH OIL) 1200 MG CAPS Take by mouth 2 (two) times daily.       QUEtiapine (SEROQUEL) 300 MG tablet      No facility-administered medications prior to visit.    ROS Review of Systems  Constitutional: Positive for unexpected weight change (wt gain). Negative for appetite change, chills, diaphoresis and fatigue.  HENT: Negative.   Eyes: Negative for visual disturbance.  Respiratory: Negative for cough, chest tightness, shortness of breath and  wheezing.   Cardiovascular: Negative for chest pain, palpitations and leg swelling.  Gastrointestinal: Negative for abdominal pain, blood in stool, constipation, diarrhea, nausea and vomiting.  Endocrine: Negative.   Genitourinary: Negative.  Negative for difficulty urinating, dysuria, hematuria and testicular pain.  Musculoskeletal: Negative.  Negative for arthralgias.  Skin: Negative for color change.  Neurological: Negative.  Negative for dizziness, weakness, light-headedness and headaches.  Hematological: Negative for adenopathy. Does not bruise/bleed easily.  Psychiatric/Behavioral: Negative.     Objective:  BP (!) 156/82 (BP Location: Left Arm, Patient Position: Sitting, Cuff Size: Large)    Pulse 78    Temp 97.9 F (36.6 C) (Oral)    Resp 16    Ht 6' 2"  (1.88 m)    Wt 232 lb (105.2 kg)    SpO2 95%    BMI 29.79 kg/m   BP Readings from Last 3 Encounters:  02/03/20 (!) 156/82  03/11/19 (!) 142/88  01/07/19 (!) 148/96    Wt Readings from Last 3 Encounters:  02/03/20 232 lb (105.2 kg)  03/11/19 225 lb (102.1 kg)  01/07/19 233 lb (105.7 kg)    Physical Exam Vitals reviewed.  Constitutional:      Appearance: Normal appearance.  HENT:     Nose: Nose normal.     Mouth/Throat:     Mouth: Mucous membranes are moist.  Eyes:     General: No scleral icterus.    Conjunctiva/sclera: Conjunctivae normal.  Cardiovascular:  Rate and Rhythm: Normal rate and regular rhythm. Occasional extrasystoles are present.    Pulses: Normal pulses.     Heart sounds: No murmur.     Comments: EKG - Sinus rhythm with occasional PVC LAD Otherwise normal EKG Pulmonary:     Effort: Pulmonary effort is normal.     Breath sounds: No stridor. No wheezing, rhonchi or rales.  Abdominal:     General: Abdomen is flat. Bowel sounds are normal. There is no distension.     Palpations: Abdomen is soft. There is no hepatomegaly, splenomegaly or mass.     Tenderness: There is no abdominal tenderness.      Hernia: No hernia is present. There is no hernia in the left inguinal area or right inguinal area.  Genitourinary:    Pubic Area: No rash.      Penis: Normal and circumcised. No discharge, swelling or lesions.      Testes: Normal.        Right: Mass, tenderness or swelling not present.        Left: Mass, tenderness or swelling not present.     Epididymis:     Right: Normal. Not inflamed or enlarged. No mass.     Left: Normal. Not inflamed or enlarged. No mass.     Prostate: Enlarged (1+ smooth symm BPH). Not tender and no nodules present.     Rectum: Normal. Guaiac result negative. No mass, tenderness, anal fissure, external hemorrhoid or internal hemorrhoid. Normal anal tone.  Musculoskeletal:     Cervical back: Neck supple.     Right lower leg: No edema.     Left lower leg: No edema.  Lymphadenopathy:     Cervical: No cervical adenopathy.     Lower Body: No right inguinal adenopathy. No left inguinal adenopathy.  Skin:    General: Skin is warm and dry.     Coloration: Skin is not pale.     Findings: No rash.  Neurological:     General: No focal deficit present.     Mental Status: He is alert and oriented to person, place, and time. Mental status is at baseline.  Psychiatric:        Mood and Affect: Mood normal.        Behavior: Behavior normal.     Lab Results  Component Value Date   WBC 6.3 02/03/2020   HGB 13.2 02/03/2020   HCT 38.0 (L) 02/03/2020   PLT 254.0 02/03/2020   GLUCOSE 101 (H) 02/03/2020   CHOL 126 02/03/2020   TRIG 81.0 02/03/2020   HDL 44.90 02/03/2020   LDLDIRECT 102.0 09/09/2017   LDLCALC 65 02/03/2020   ALT 26 02/03/2020   AST 26 02/03/2020   NA 132 (L) 02/03/2020   K 4.0 02/03/2020   CL 99 02/03/2020   CREATININE 0.87 02/03/2020   BUN 12 02/03/2020   CO2 27 02/03/2020   TSH 0.79 02/03/2020   PSA 2.04 02/03/2020   INR 0.9 03/08/2008   HGBA1C 6.2 02/03/2020    DG Chest 2 View  Result Date: 09/21/2013 CLINICAL DATA:  Cough EXAM: CHEST - 2  VIEW COMPARISON:  03/08/2008 FINDINGS: Lungs are clear. Heart size and mediastinal contours are within normal limits. No effusion. Visualized skeletal structures are unremarkable. IMPRESSION: No acute cardiopulmonary disease. Electronically Signed   By: Arne Cleveland M.D.   On: 09/21/2013 14:24    Assessment & Plan:   Zinedine was seen today for hypertension, hyperlipidemia and annual exam.  Diagnoses and all orders  for this visit:  Essential hypertension- His blood pressure is not adequately well controlled.  He has 1 PVC on his EKG. He is asymptomatic with this.  His labs are negative for secondary causes or endorgan damage.  He does not want an add another antihypertensive at this time.  He will improve his lifestyle modifications and will recheck his blood pressure in about 2 or 3 months. -     CBC with Differential/Platelet; Future -     Basic metabolic panel; Future -     TSH; Future -     Urinalysis, Routine w reflex microscopic; Future -     EKG 12-Lead -     Magnesium; Future -     Magnesium -     Urinalysis, Routine w reflex microscopic -     TSH -     CBC with Differential/Platelet -     Basic metabolic panel  Hyperglycemia- His A1c is at 6.2%.  Medical therapy is not indicated. -     Basic metabolic panel; Future -     Hemoglobin A1c; Future -     Hemoglobin A1c -     Basic metabolic panel  Routine general medical examination at a health care facility- Exam completed, labs reviewed, vaccines reviewed and updated, cancer screenings are up-to-date, patient education material was given.  So family -     Lipid panel; Future -     PSA; Future -     PSA -     Lipid panel  Dyslipidemia, goal LDL below 100- He has achieved his LDL goal and is doing well on the statin. -     Hepatic function panel; Future -     TSH; Future -     TSH -     Hepatic function panel  Unifocal PVCs- He has 1 asymptomatic PVC.  Evaluation for secondary causes is negative.  Will continue to monitor  this. -     Magnesium; Future -     Magnesium   I am having [name] maintain his Fish Oil, DULoxetine, lamoTRIgine, multivitamin, QUEtiapine, mesalamine, olmesartan, atorvastatin, alfuzosin, and dutasteride.  No orders of the defined types were placed in this encounter.    Follow-up: Return in about 3 months (around 05/05/2020).  [name], MD

## 2020-02-04 ENCOUNTER — Encounter: Payer: Self-pay | Admitting: Internal Medicine

## 2020-02-07 ENCOUNTER — Encounter (HOSPITAL_COMMUNITY): Payer: Self-pay

## 2020-02-07 ENCOUNTER — Emergency Department (HOSPITAL_COMMUNITY)
Admission: EM | Admit: 2020-02-07 | Discharge: 2020-02-08 | Disposition: A | Discharge status: Home / Self Care | Payer: BC Managed Care – PPO | Attending: Emergency Medicine | Admitting: Emergency Medicine

## 2020-02-07 ENCOUNTER — Emergency Department (HOSPITAL_COMMUNITY): Payer: BC Managed Care – PPO

## 2020-02-07 ENCOUNTER — Other Ambulatory Visit: Payer: Self-pay

## 2020-02-07 DIAGNOSIS — W010XXA Fall on same level from slipping, tripping and stumbling without subsequent striking against object, initial encounter: Secondary | ICD-10-CM | POA: Diagnosis not present

## 2020-02-07 DIAGNOSIS — I10 Essential (primary) hypertension: Secondary | ICD-10-CM | POA: Insufficient documentation

## 2020-02-07 DIAGNOSIS — R52 Pain, unspecified: Secondary | ICD-10-CM | POA: Diagnosis not present

## 2020-02-07 DIAGNOSIS — S81011A Laceration without foreign body, right knee, initial encounter: Secondary | ICD-10-CM | POA: Insufficient documentation

## 2020-02-07 DIAGNOSIS — S8991XA Unspecified injury of right lower leg, initial encounter: Secondary | ICD-10-CM | POA: Diagnosis not present

## 2020-02-07 DIAGNOSIS — Y9301 Activity, walking, marching and hiking: Secondary | ICD-10-CM | POA: Insufficient documentation

## 2020-02-07 DIAGNOSIS — Z79899 Other long term (current) drug therapy: Secondary | ICD-10-CM | POA: Diagnosis not present

## 2020-02-07 DIAGNOSIS — W19XXXA Unspecified fall, initial encounter: Secondary | ICD-10-CM | POA: Diagnosis not present

## 2020-02-07 DIAGNOSIS — Y929 Unspecified place or not applicable: Secondary | ICD-10-CM | POA: Diagnosis not present

## 2020-02-07 DIAGNOSIS — M25561 Pain in right knee: Secondary | ICD-10-CM | POA: Diagnosis not present

## 2020-02-07 DIAGNOSIS — R609 Edema, unspecified: Secondary | ICD-10-CM | POA: Diagnosis not present

## 2020-02-07 DIAGNOSIS — Y999 Unspecified external cause status: Secondary | ICD-10-CM | POA: Diagnosis not present

## 2020-02-07 DIAGNOSIS — M25461 Effusion, right knee: Secondary | ICD-10-CM | POA: Diagnosis not present

## 2020-02-07 DIAGNOSIS — S80911A Unspecified superficial injury of right knee, initial encounter: Secondary | ICD-10-CM | POA: Diagnosis not present

## 2020-02-07 NOTE — ED Triage Notes (Signed)
Patient arrives from home after suffering a fall, states he had a few drinks tonight and went home and slipped and fell on right knee, has some swelling and redness noted to knee, unable to bear weight per EMS

## 2020-02-08 DIAGNOSIS — S76191A Other specified injury of right quadriceps muscle, fascia and tendon, initial encounter: Secondary | ICD-10-CM | POA: Diagnosis not present

## 2020-02-08 DIAGNOSIS — M25561 Pain in right knee: Secondary | ICD-10-CM | POA: Diagnosis not present

## 2020-02-08 DIAGNOSIS — S80911A Unspecified superficial injury of right knee, initial encounter: Secondary | ICD-10-CM | POA: Diagnosis not present

## 2020-02-08 NOTE — ED Provider Notes (Signed)
[facility] DEPT Provider Note  CSN: [id number] Arrival date & time: 02/07/20 2231  Chief Complaint(s) Fall  HPI [name] is a 64 y.o. male    Knee Pain Location:  Knee Injury: yes   Mechanism of injury: fall   Mechanism of injury comment:  Slipped Fall:    Fall occurred:  Walking   Point of impact:  Buttocks Knee location:  R knee Pain details:    Quality:  Aching   Severity:  Moderate   Onset quality:  Sudden   Timing:  Constant   Progression:  Waxing and waning Chronicity:  New Relieved by:  Immobilization Worsened by:  Flexion, extension and bearing weight Associated symptoms: decreased ROM, muscle weakness and swelling   Associated symptoms: no back pain     Past Medical History Past Medical History:  Diagnosis Date  . Alcoholism ([place])    10 yrs mostly sober in [place]   . Bipolar 2 disorder ([place])   . Cancer ([place])    skin, basal cell  . Depression   . Hemorrhoids 11-23-2010   Colonoscopy with [facility]  . Hyperlipemia   . Muscle spasm of back   . Personal history of colonic polyps 11-23-2010   5 mm adenoma Colonoscopy with [facility]  . Rosacea   . Ulcerative colitis 11-23-2010   Colonoscopy with [facility]   Patient Active Problem List   Diagnosis Date Noted  . Unifocal PVCs 02/03/2020  . Benign prostatic hyperplasia with urinary hesitancy 10/15/2019  . Hyperglycemia 01/07/2019  . Essential hypertension 01/07/2019  . Onychomycosis of toenail 05/10/2016  . Chronic post-traumatic stress disorder (PTSD) 11/09/2015  . Alcohol use disorder, severe, dependence ([place]) 11/09/2015  . Routine general medical examination at a health care facility 11/11/2012  . Dyslipidemia, goal LDL below 100 06/19/2011  . Universal ulcerative colitis ([place]) 04/26/2011  . Bipolar 2 disorder ([place]) 04/26/2011  . Hx of adenomatous colonic polyps 11/23/2010   Home Medication(s) Prior to Admission medications   Medication Sig Start Date End Date Taking?  Authorizing Provider  alfuzosin (UROXATRAL) 10 MG 24 hr tablet TAKE ONE TABLET BY MOUTH DAILY WITH BREAKFAST 01/11/20   Marrian Salvage, FNP  atorvastatin (LIPITOR) 80 MG tablet Take 1 tablet (80 mg total) by mouth daily. 10/16/19   Janith Lima, MD  DULoxetine (CYMBALTA) 60 MG capsule Take 60 mg by mouth daily.    [provider]  dutasteride (AVODART) 0.5 MG capsule TAKE ONE CAPSULE BY MOUTH DAILY 01/11/20   Marrian Salvage, FNP  lamoTRIgine (LAMICTAL) 200 MG tablet  02/11/15   [provider]  mesalamine (LIALDA) 1.2 g EC tablet Take 2 tablets (2.4 g total) by mouth daily. 10/01/19   Gatha Mayer, MD  Multiple Vitamin (MULTIVITAMIN) tablet Take 1 tablet by mouth daily.    [provider]  olmesartan (BENICAR) 40 MG tablet Take 1 tablet (40 mg total) by mouth daily. 10/16/19   Janith Lima, MD  Omega-3 Fatty Acids (FISH OIL) 1200 MG CAPS Take by mouth 2 (two) times daily.      [provider]  QUEtiapine (SEROQUEL) 300 MG tablet  08/14/18   [provider]  Past Surgical History Past Surgical History:  Procedure Laterality Date  . ACHILLES TENDON REPAIR  1995  . BASAL CELL CARCINOMA EXCISION    . COLONOSCOPY    . COLONOSCOPY W/ BIOPSIES  2003, 04/2008, 11/2010   2003: proctitis 2009: ulcerative colitis and hemorrhoids 2012: ulcerative colitis and 56m sigmoid  tubular adenoma  . MOHS SURGERY  2006  . SHOULDER SURGERY     Family History Family History  Adopted: Yes    Social History Social History   Tobacco Use  . Smoking status: Never Smoker  . Smokeless tobacco: Never Used  Substance Use Topics  . Alcohol use: Yes    Alcohol/week: 14.0 standard drinks    Types: 14 Glasses of wine per week  . Drug use: No   Allergies Patient has no known allergies.  Review of Systems Review of Systems   Musculoskeletal: Negative for back pain.   All other systems are reviewed and are negative for acute change except as noted in the HPI  Physical Exam Vital Signs  I have reviewed the triage vital signs BP 122/78 (BP Location: Left Arm)   Pulse 69   Temp 98 F (36.7 C) (Oral)   Resp 18   SpO2 99%   Physical Exam Vitals reviewed.  Constitutional:      General: He is not in acute distress.    Appearance: He is well-developed. He is not diaphoretic.  HENT:     Head: Normocephalic and atraumatic.     Jaw: No trismus.     Right Ear: External ear normal.     Left Ear: External ear normal.     Nose: Nose normal.  Eyes:     General: No scleral icterus.    Conjunctiva/sclera: Conjunctivae normal.  Neck:     Trachea: Phonation normal.  Cardiovascular:     Rate and Rhythm: Normal rate and regular rhythm.  Pulmonary:     Effort: Pulmonary effort is normal. No respiratory distress.     Breath sounds: No stridor.  Abdominal:     General: There is no distension.  Musculoskeletal:     Cervical back: Normal range of motion.     Right knee: Swelling and laceration (superficial to anterior knee, approx 3 cm) present. No bony tenderness. Decreased range of motion. Tenderness present over the patellar tendon. Normal patellar mobility. Normal pulse.     Left knee: No swelling. No tenderness.  Neurological:     Mental Status: He is alert and oriented to person, place, and time.  Psychiatric:        Behavior: Behavior normal.     ED Results and Treatments Labs (all labs ordered are listed, but only abnormal results are displayed) Labs Reviewed - No data to display                                                                                                                       EKG  EKG Interpretation  Date/Time:    Ventricular Rate:  PR Interval:    QRS Duration:   QT Interval:    QTC Calculation:   R Axis:     Text Interpretation:        Radiology DG Knee Complete 4  Views Right  Result Date: 02/07/2020 CLINICAL DATA:  Status post fall. EXAM: RIGHT KNEE - COMPLETE 4+ VIEW COMPARISON:  None. FINDINGS: No evidence of acute fracture or dislocation. No evidence of arthropathy or other focal bone abnormality. A large joint effusion is seen. There is moderate severity vascular calcification. IMPRESSION: Large joint effusion without evidence of acute fracture. Electronically Signed   By: Virgina Norfolk M.D.   On: 02/07/2020 23:59    Pertinent labs & imaging results that were available during my care of the patient were reviewed by me and considered in my medical decision making (see chart for details).  Medications Ordered in ED Medications - No data to display                                                                                                                                  Procedures Procedures  (including critical care time)  Medical Decision Making / ED Course I have reviewed the nursing notes for this encounter and the patient's prior records (if available in EHR or on provided paperwork).   [name] was evaluated in Emergency Department on 02/08/2020 for the symptoms described in the history of present illness. He was evaluated in the context of the global COVID-19 pandemic, which necessitated consideration that the patient might be at risk for infection with the SARS-CoV-2 virus that causes COVID-19. Institutional protocols and algorithms that pertain to the evaluation of patients at risk for COVID-19 are in a state of rapid change based on information released by regulatory bodies including the CDC and federal and state organizations. These policies and algorithms were followed during the patient's care in the ED.  Mechanical fall  Right knee pain and swelling No other injuries  Unable to extend at the knee. Plain film w/o fracture or dislocation. Possible tendon tear/rupture. Knee immobilizer and crutches provided. Already has Ortho  MD. Close follow up recommended.      Final Clinical Impression(s) / ED Diagnoses Final diagnoses:  Fall, initial encounter  Injury of right knee, initial encounter   The patient appears reasonably screened and/or stabilized for discharge and I doubt any other medical condition or other [facility] requiring further screening, evaluation, or treatment in the ED at this time prior to discharge. Safe for discharge with strict return precautions.  Disposition: Discharge  Condition: Good  I have discussed the results, Dx and Tx plan with the patient/family who expressed understanding and agree(s) with the plan. Discharge instructions discussed at length. The patient/family was given strict return precautions who verbalized understanding of the instructions. No further questions at time of discharge.    ED Discharge Orders    None  Follow Up: Orthopedic  Schedule an appointment as soon as possible for a visit  For close follow up to assess for right knee injury      This chart was dictated using voice recognition software.  Despite best efforts to proofread,  errors can occur which can change the documentation meaning.   Fatima Blank, MD 02/08/20 [phone]

## 2020-02-11 ENCOUNTER — Encounter: Payer: Self-pay | Admitting: Internal Medicine

## 2020-02-15 ENCOUNTER — Ambulatory Visit: Payer: BC Managed Care – PPO | Admitting: Internal Medicine

## 2020-02-18 DIAGNOSIS — X58XXXA Exposure to other specified factors, initial encounter: Secondary | ICD-10-CM | POA: Diagnosis not present

## 2020-02-18 DIAGNOSIS — Y999 Unspecified external cause status: Secondary | ICD-10-CM | POA: Diagnosis not present

## 2020-02-18 DIAGNOSIS — S76111A Strain of right quadriceps muscle, fascia and tendon, initial encounter: Secondary | ICD-10-CM | POA: Diagnosis not present

## 2020-02-18 DIAGNOSIS — S76191A Other specified injury of right quadriceps muscle, fascia and tendon, initial encounter: Secondary | ICD-10-CM | POA: Diagnosis not present

## 2020-02-18 DIAGNOSIS — G8918 Other acute postprocedural pain: Secondary | ICD-10-CM | POA: Diagnosis not present

## 2020-02-23 DIAGNOSIS — S76191D Other specified injury of right quadriceps muscle, fascia and tendon, subsequent encounter: Secondary | ICD-10-CM | POA: Diagnosis not present

## 2020-02-24 DIAGNOSIS — F3181 Bipolar II disorder: Secondary | ICD-10-CM | POA: Diagnosis not present

## 2020-02-29 ENCOUNTER — Encounter: Payer: Self-pay | Admitting: Internal Medicine

## 2020-03-03 DIAGNOSIS — F3181 Bipolar II disorder: Secondary | ICD-10-CM | POA: Diagnosis not present

## 2020-03-03 DIAGNOSIS — Z4789 Encounter for other orthopedic aftercare: Secondary | ICD-10-CM | POA: Diagnosis not present

## 2020-03-04 ENCOUNTER — Telehealth: Payer: Self-pay | Admitting: Internal Medicine

## 2020-03-04 MED ORDER — MESALAMINE 1.2 G PO TBEC: 2.4000 g | DELAYED_RELEASE_TABLET | Freq: Every day | ORAL | 0 refills | Status: DC

## 2020-03-04 NOTE — Telephone Encounter (Signed)
I faxed the mesalamine rx as requested to [facility] and put on the cover sheet for patient to call for appointment.

## 2020-03-10 ENCOUNTER — Telehealth: Payer: Self-pay | Admitting: Internal Medicine

## 2020-03-10 DIAGNOSIS — F3181 Bipolar II disorder: Secondary | ICD-10-CM | POA: Diagnosis not present

## 2020-03-10 MED ORDER — MESALAMINE 1.2 G PO TBEC: 2.4000 g | DELAYED_RELEASE_TABLET | Freq: Every day | ORAL | 0 refills | Status: DC

## 2020-03-10 NOTE — Telephone Encounter (Signed)
Mesalamine sent to local pharmacy as requested to cover him until the mail order comes. Patient informed.

## 2020-03-10 NOTE — Telephone Encounter (Signed)
Patient is calling, states that the mesalamine that was called in will not arrive for 10 days. he is asking if the medication can be called in to last him until they arrive, he is completely out. [facility]: [facility], [address]. Has appointment on 9/2 with Dr. Carlean Purl

## 2020-03-11 DIAGNOSIS — Z4789 Encounter for other orthopedic aftercare: Secondary | ICD-10-CM | POA: Diagnosis not present

## 2020-03-14 DIAGNOSIS — S76191D Other specified injury of right quadriceps muscle, fascia and tendon, subsequent encounter: Secondary | ICD-10-CM | POA: Diagnosis not present

## 2020-03-22 ENCOUNTER — Other Ambulatory Visit: Payer: Self-pay | Admitting: Internal Medicine

## 2020-03-25 DIAGNOSIS — F3181 Bipolar II disorder: Secondary | ICD-10-CM | POA: Diagnosis not present

## 2020-04-04 DIAGNOSIS — M25661 Stiffness of right knee, not elsewhere classified: Secondary | ICD-10-CM | POA: Diagnosis not present

## 2020-04-07 ENCOUNTER — Other Ambulatory Visit: Payer: Self-pay | Admitting: Internal Medicine

## 2020-04-07 DIAGNOSIS — E785 Hyperlipidemia, unspecified: Secondary | ICD-10-CM

## 2020-04-08 ENCOUNTER — Other Ambulatory Visit: Payer: Self-pay | Admitting: Family

## 2020-04-08 DIAGNOSIS — N401 Enlarged prostate with lower urinary tract symptoms: Secondary | ICD-10-CM

## 2020-04-08 DIAGNOSIS — R3911 Hesitancy of micturition: Secondary | ICD-10-CM

## 2020-04-12 ENCOUNTER — Encounter: Payer: Self-pay | Admitting: Internal Medicine

## 2020-04-13 DIAGNOSIS — M25669 Stiffness of unspecified knee, not elsewhere classified: Secondary | ICD-10-CM | POA: Diagnosis not present

## 2020-04-15 DIAGNOSIS — F3181 Bipolar II disorder: Secondary | ICD-10-CM | POA: Diagnosis not present

## 2020-04-15 DIAGNOSIS — M25669 Stiffness of unspecified knee, not elsewhere classified: Secondary | ICD-10-CM | POA: Diagnosis not present

## 2020-04-19 DIAGNOSIS — M25669 Stiffness of unspecified knee, not elsewhere classified: Secondary | ICD-10-CM | POA: Diagnosis not present

## 2020-04-21 DIAGNOSIS — M25669 Stiffness of unspecified knee, not elsewhere classified: Secondary | ICD-10-CM | POA: Diagnosis not present

## 2020-04-26 ENCOUNTER — Encounter: Payer: Self-pay | Admitting: Internal Medicine

## 2020-04-26 DIAGNOSIS — M25669 Stiffness of unspecified knee, not elsewhere classified: Secondary | ICD-10-CM | POA: Diagnosis not present

## 2020-04-28 DIAGNOSIS — M25669 Stiffness of unspecified knee, not elsewhere classified: Secondary | ICD-10-CM | POA: Diagnosis not present

## 2020-05-02 DIAGNOSIS — F3181 Bipolar II disorder: Secondary | ICD-10-CM | POA: Diagnosis not present

## 2020-05-02 DIAGNOSIS — F1020 Alcohol dependence, uncomplicated: Secondary | ICD-10-CM | POA: Diagnosis not present

## 2020-05-03 DIAGNOSIS — M25669 Stiffness of unspecified knee, not elsewhere classified: Secondary | ICD-10-CM | POA: Diagnosis not present

## 2020-05-03 MED ORDER — MESALAMINE 1.2 G PO TBEC: 2.4000 g | DELAYED_RELEASE_TABLET | Freq: Every day | ORAL | 0 refills | Status: DC

## 2020-05-05 ENCOUNTER — Ambulatory Visit: Payer: BC Managed Care – PPO | Admitting: Internal Medicine

## 2020-05-05 DIAGNOSIS — M25669 Stiffness of unspecified knee, not elsewhere classified: Secondary | ICD-10-CM | POA: Diagnosis not present

## 2020-05-05 DIAGNOSIS — M25561 Pain in right knee: Secondary | ICD-10-CM | POA: Diagnosis not present

## 2020-05-10 ENCOUNTER — Ambulatory Visit: Payer: BC Managed Care – PPO | Admitting: Internal Medicine

## 2020-05-10 DIAGNOSIS — M25669 Stiffness of unspecified knee, not elsewhere classified: Secondary | ICD-10-CM | POA: Diagnosis not present

## 2020-05-10 DIAGNOSIS — M25561 Pain in right knee: Secondary | ICD-10-CM | POA: Diagnosis not present

## 2020-05-12 DIAGNOSIS — M25669 Stiffness of unspecified knee, not elsewhere classified: Secondary | ICD-10-CM | POA: Diagnosis not present

## 2020-05-13 DIAGNOSIS — F3181 Bipolar II disorder: Secondary | ICD-10-CM | POA: Diagnosis not present

## 2020-05-13 DIAGNOSIS — S76191D Other specified injury of right quadriceps muscle, fascia and tendon, subsequent encounter: Secondary | ICD-10-CM | POA: Diagnosis not present

## 2020-05-17 DIAGNOSIS — M25561 Pain in right knee: Secondary | ICD-10-CM | POA: Diagnosis not present

## 2020-05-17 DIAGNOSIS — M25669 Stiffness of unspecified knee, not elsewhere classified: Secondary | ICD-10-CM | POA: Diagnosis not present

## 2020-05-18 ENCOUNTER — Encounter: Payer: Self-pay | Admitting: Gastroenterology

## 2020-05-19 DIAGNOSIS — M25669 Stiffness of unspecified knee, not elsewhere classified: Secondary | ICD-10-CM | POA: Diagnosis not present

## 2020-05-19 DIAGNOSIS — M25561 Pain in right knee: Secondary | ICD-10-CM | POA: Diagnosis not present

## 2020-05-26 DIAGNOSIS — M25669 Stiffness of unspecified knee, not elsewhere classified: Secondary | ICD-10-CM | POA: Diagnosis not present

## 2020-05-26 DIAGNOSIS — M25561 Pain in right knee: Secondary | ICD-10-CM | POA: Diagnosis not present

## 2020-05-31 ENCOUNTER — Encounter: Payer: Self-pay | Admitting: Internal Medicine

## 2020-05-31 ENCOUNTER — Ambulatory Visit (INDEPENDENT_AMBULATORY_CARE_PROVIDER_SITE_OTHER): Payer: BC Managed Care – PPO | Admitting: Internal Medicine

## 2020-05-31 ENCOUNTER — Other Ambulatory Visit: Payer: Self-pay

## 2020-05-31 VITALS — BP 128/82 | HR 62 | Temp 98.0°F | Resp 16 | Ht 74.0 in | Wt 226.0 lb

## 2020-05-31 DIAGNOSIS — I10 Essential (primary) hypertension: Secondary | ICD-10-CM | POA: Diagnosis not present

## 2020-05-31 DIAGNOSIS — Z23 Encounter for immunization: Secondary | ICD-10-CM

## 2020-05-31 DIAGNOSIS — D539 Nutritional anemia, unspecified: Secondary | ICD-10-CM | POA: Diagnosis not present

## 2020-05-31 NOTE — Patient Instructions (Signed)
Anemia  Anemia is a condition in which you do not have enough red blood cells or hemoglobin. Hemoglobin is a substance in red blood cells that carries oxygen. When you do not have enough red blood cells or hemoglobin (are anemic), your body cannot get enough oxygen and your organs may not work properly. As a result, you may feel very tired or have other problems. What are the causes? Common causes of anemia include:  Excessive bleeding. Anemia can be caused by excessive bleeding inside or outside the body, including bleeding from the intestine or from periods in women.  Poor nutrition.  Long-lasting (chronic) kidney, thyroid, and liver disease.  Bone marrow disorders.  Cancer and treatments for cancer.  HIV (human immunodeficiency virus) and AIDS (acquired immunodeficiency syndrome).  Treatments for HIV and AIDS.  Spleen problems.  Blood disorders.  Infections, medicines, and autoimmune disorders that destroy red blood cells. What are the signs or symptoms? Symptoms of this condition include:  Minor weakness.  Dizziness.  Headache.  Feeling heartbeats that are irregular or faster than normal (palpitations).  Shortness of breath, especially with exercise.  Paleness.  Cold sensitivity.  Indigestion.  Nausea.  Difficulty sleeping.  Difficulty concentrating. Symptoms may occur suddenly or develop slowly. If your anemia is mild, you may not have symptoms. How is this diagnosed? This condition is diagnosed based on:  Blood tests.  Your medical history.  A physical exam.  Bone marrow biopsy. Your health care provider may also check your stool (feces) for blood and may do additional testing to look for the cause of your bleeding. You may also have other tests, including:  Imaging tests, such as a CT scan or MRI.  Endoscopy.  Colonoscopy. How is this treated? Treatment for this condition depends on the cause. If you continue to lose a lot of blood, you may  need to be treated at a hospital. Treatment may include:  Taking supplements of iron, vitamin S31, or folic acid.  Taking a hormone medicine (erythropoietin) that can help to stimulate red blood cell growth.  Having a blood transfusion. This may be needed if you lose a lot of blood.  Making changes to your diet.  Having surgery to remove your spleen. Follow these instructions at home:  Take over-the-counter and prescription medicines only as told by your health care provider.  Take supplements only as told by your health care provider.  Follow any diet instructions that you were given.  Keep all follow-up visits as told by your health care provider. This is important. Contact a health care provider if:  You develop new bleeding anywhere in the body. Get help right away if:  You are very weak.  You are short of breath.  You have pain in your abdomen or chest.  You are dizzy or feel faint.  You have trouble concentrating.  You have bloody or black, tarry stools.  You vomit repeatedly or you vomit up blood. Summary  Anemia is a condition in which you do not have enough red blood cells or enough of a substance in your red blood cells that carries oxygen (hemoglobin).  Symptoms may occur suddenly or develop slowly.  If your anemia is mild, you may not have symptoms.  This condition is diagnosed with blood tests as well as a medical history and physical exam. Other tests may be needed.  Treatment for this condition depends on the cause of the anemia. This information is not intended to replace advice given to you by  your health care provider. Make sure you discuss any questions you have with your health care provider. Document Revised: 08/02/2017 Document Reviewed: 09/21/2016 Elsevier Patient Education  [place].

## 2020-05-31 NOTE — Progress Notes (Signed)
Subjective:  Patient ID: [name], male    DOB: 1956/07/07  Age: 64 y.o. MRN: [id number]  CC: Hypertension and Anemia  This visit occurred during the SARS-CoV-2 public health emergency.  Safety protocols were in place, including screening questions prior to the visit, additional usage of staff PPE, and extensive cleaning of exam room while observing appropriate contact time as indicated for disinfecting solutions.    HPI [name] presents for f/up - He was intoxicated on alcohol about 6 weeks ago and he slipped in his home and fell.  He injured his right lower extremity.  He tells me it is 80% better.  When he was seen in the ED he was found to be mildly anemic.  He has since abstained from alcohol and tells me that he is taking an iron supplement.  Outpatient Medications Prior to Visit  Medication Sig Dispense Refill  . alfuzosin (UROXATRAL) 10 MG 24 hr tablet TAKE ONE TABLET BY MOUTH DAILY WITH BREAKFAST 90 tablet 0  . atorvastatin (LIPITOR) 80 MG tablet TAKE ONE TABLET BY MOUTH DAILY 90 tablet 0  . DULoxetine (CYMBALTA) 60 MG capsule Take 60 mg by mouth daily.    Marland Kitchen dutasteride (AVODART) 0.5 MG capsule TAKE ONE CAPSULE BY MOUTH DAILY 90 capsule 0  . lamoTRIgine (LAMICTAL) 200 MG tablet   1  . mesalamine (LIALDA) 1.2 g EC tablet Take 2 tablets (2.4 g total) by mouth daily. 180 tablet 0  . Multiple Vitamin (MULTIVITAMIN) tablet Take 1 tablet by mouth daily.    Marland Kitchen olmesartan (BENICAR) 40 MG tablet Take 1 tablet (40 mg total) by mouth daily. 90 tablet 1  . Omega-3 Fatty Acids (FISH OIL) 1200 MG CAPS Take by mouth 2 (two) times daily.      . QUEtiapine (SEROQUEL) 300 MG tablet 200 mg.      No facility-administered medications prior to visit.    ROS Review of Systems  Constitutional: Negative.  Negative for appetite change, diaphoresis, fatigue and unexpected weight change.  HENT: Negative.   Eyes: Negative.   Respiratory: Negative for cough, chest tightness, shortness of  breath and wheezing.   Cardiovascular: Negative for chest pain, palpitations and leg swelling.  Gastrointestinal: Negative for abdominal pain, constipation, diarrhea, nausea and vomiting.  Endocrine: Negative.   Genitourinary: Negative.  Negative for difficulty urinating and flank pain.  Musculoskeletal: Positive for arthralgias. Negative for back pain, myalgias and neck pain.  Skin: Negative.   Neurological: Negative.  Negative for dizziness, weakness, numbness and headaches.  Hematological: Negative for adenopathy. Does not bruise/bleed easily.  Psychiatric/Behavioral: Negative.     Objective:  BP 128/82   Pulse 62   Temp 98 F (36.7 C) (Oral)   Resp 16   Ht 6' 2"  (1.88 m)   Wt 226 lb (102.5 kg)   SpO2 98%   BMI 29.02 kg/m   BP Readings from Last 3 Encounters:  05/31/20 128/82  02/08/20 131/74  02/03/20 (!) 156/82    Wt Readings from Last 3 Encounters:  05/31/20 226 lb (102.5 kg)  02/03/20 232 lb (105.2 kg)  03/11/19 225 lb (102.1 kg)    Physical Exam Vitals reviewed.  HENT:     Nose: Nose normal.     Mouth/Throat:     Mouth: Mucous membranes are moist.  Eyes:     General: No scleral icterus.    Conjunctiva/sclera: Conjunctivae normal.  Cardiovascular:     Rate and Rhythm: Normal rate and regular rhythm.     Heart  sounds: No murmur heard.   Pulmonary:     Effort: Pulmonary effort is normal.     Breath sounds: No stridor. No wheezing, rhonchi or rales.  Abdominal:     General: Abdomen is flat. There is no distension.     Palpations: There is no mass.     Tenderness: There is no abdominal tenderness.  Musculoskeletal:        General: Normal range of motion.     Cervical back: Neck supple.     Right lower leg: No edema.     Left lower leg: No edema.  Lymphadenopathy:     Cervical: No cervical adenopathy.  Skin:    General: Skin is warm and dry.     Coloration: Skin is not pale.  Neurological:     General: No focal deficit present.     Mental Status:  He is alert.  Psychiatric:        Mood and Affect: Mood normal.        Behavior: Behavior normal.     Lab Results  Component Value Date   WBC 6.7 05/31/2020   HGB 13.8 05/31/2020   HCT 40.6 05/31/2020   PLT 265 05/31/2020   GLUCOSE 98 05/31/2020   CHOL 126 02/03/2020   TRIG 81.0 02/03/2020   HDL 44.90 02/03/2020   LDLDIRECT 102.0 09/09/2017   LDLCALC 65 02/03/2020   ALT 26 02/03/2020   AST 26 02/03/2020   NA 135 05/31/2020   K 4.5 05/31/2020   CL 102 05/31/2020   CREATININE 0.82 05/31/2020   BUN 13 05/31/2020   CO2 25 05/31/2020   TSH 0.79 02/03/2020   PSA 2.04 02/03/2020   INR 0.9 03/08/2008   HGBA1C 6.2 02/03/2020    DG Knee Complete 4 Views Right  Result Date: 02/07/2020 CLINICAL DATA:  Status post fall. EXAM: RIGHT KNEE - COMPLETE 4+ VIEW COMPARISON:  None. FINDINGS: No evidence of acute fracture or dislocation. No evidence of arthropathy or other focal bone abnormality. A large joint effusion is seen. There is moderate severity vascular calcification. IMPRESSION: Large joint effusion without evidence of acute fracture. Electronically Signed   By: Virgina Norfolk M.D.   On: 02/07/2020 23:59    Assessment & Plan:   [name] was seen today for hypertension and anemia.  Diagnoses and all orders for this visit:  Flu vaccine need -     Cancel: Flu Vaccine QUAD High Dose(Fluad)  Deficiency anemia- He is no longer anemic and his vitamin levels are normal. -     CBC with Differential/Platelet; Future -     Vitamin B12; Future -     Iron; Future -     Vitamin B1; Future -     Folate; Future -     Ferritin; Future -     Ferritin -     Folate -     Vitamin B1 -     Iron -     Vitamin B12 -     CBC with Differential/Platelet  Essential hypertension- His blood pressure is adequately well controlled.  Electrolytes and renal function are normal.  Will continue the current dose of the ARB. -     BASIC METABOLIC PANEL WITH GFR; Future -     BASIC METABOLIC PANEL WITH  GFR  Other orders -     Flu Vaccine QUAD 6+ mos PF IM (Fluarix Quad PF)   I am having [name] maintain his Fish Oil, DULoxetine, lamoTRIgine, multivitamin, QUEtiapine, olmesartan, atorvastatin,  alfuzosin, dutasteride, and mesalamine.  No orders of the defined types were placed in this encounter.    Follow-up: Return in about 6 months (around 11/28/2020).  [name], MD

## 2020-06-01 DIAGNOSIS — M25669 Stiffness of unspecified knee, not elsewhere classified: Secondary | ICD-10-CM | POA: Diagnosis not present

## 2020-06-01 DIAGNOSIS — M25561 Pain in right knee: Secondary | ICD-10-CM | POA: Diagnosis not present

## 2020-06-03 DIAGNOSIS — M25669 Stiffness of unspecified knee, not elsewhere classified: Secondary | ICD-10-CM | POA: Diagnosis not present

## 2020-06-05 LAB — BASIC METABOLIC PANEL WITH GFR
BUN: 13 mg/dL (ref 7–25)
CO2: 25 mmol/L (ref 20–32)
Calcium: 9.6 mg/dL (ref 8.6–10.3)
Chloride: 102 mmol/L (ref 98–110)
Creat: 0.82 mg/dL (ref 0.70–1.25)
GFR, Est African American: 108 mL/min/{1.73_m2} (ref >=60)
GFR, Est Non African American: 93 mL/min/{1.73_m2} (ref >=60)
Glucose, Bld: 98 mg/dL (ref 65–99)
Potassium: 4.5 mmol/L (ref 3.5–5.3)
Sodium: 135 mmol/L (ref 135–146)

## 2020-06-05 LAB — CBC WITH DIFFERENTIAL/PLATELET
Absolute Monocytes: 536 cells/uL (ref 200–950)
Basophils Absolute: 40 cells/uL (ref 0–200)
Basophils Relative: 0.6 %
Eosinophils Absolute: 101 cells/uL (ref 15–500)
Eosinophils Relative: 1.5 %
HCT: 40.6 % (ref 38.5–50.0)
Hemoglobin: 13.8 g/dL (ref 13.2–17.1)
Lymphs Abs: 1514 cells/uL (ref 850–3900)
MCH: 31.7 pg (ref 27.0–33.0)
MCHC: 34 g/dL (ref 32.0–36.0)
MCV: 93.3 fL (ref 80.0–100.0)
MPV: 9.7 fL (ref 7.5–12.5)
Monocytes Relative: 8 %
Neutro Abs: 4509 cells/uL (ref 1500–7800)
Neutrophils Relative %: 67.3 %
Platelets: 265 10*3/uL (ref 140–400)
RBC: 4.35 10*6/uL (ref 4.20–5.80)
RDW: 12.1 % (ref 11.0–15.0)
Total Lymphocyte: 22.6 %
WBC: 6.7 10*3/uL (ref 3.8–10.8)

## 2020-06-05 LAB — VITAMIN B12: Vitamin B-12: 496 pg/mL (ref 200–1100)

## 2020-06-05 LAB — FERRITIN: Ferritin: 138 ng/mL (ref 24–380)

## 2020-06-05 LAB — FOLATE: Folate: 20.9 ng/mL

## 2020-06-05 LAB — IRON: Iron: 80 ug/dL (ref 50–180)

## 2020-06-05 LAB — VITAMIN B1: Vitamin B1 (Thiamine): 13 nmol/L (ref 8–30)

## 2020-06-07 DIAGNOSIS — M25669 Stiffness of unspecified knee, not elsewhere classified: Secondary | ICD-10-CM | POA: Diagnosis not present

## 2020-06-09 DIAGNOSIS — M25669 Stiffness of unspecified knee, not elsewhere classified: Secondary | ICD-10-CM | POA: Diagnosis not present

## 2020-06-09 DIAGNOSIS — M25561 Pain in right knee: Secondary | ICD-10-CM | POA: Diagnosis not present

## 2020-06-14 DIAGNOSIS — M25669 Stiffness of unspecified knee, not elsewhere classified: Secondary | ICD-10-CM | POA: Diagnosis not present

## 2020-06-14 DIAGNOSIS — M25561 Pain in right knee: Secondary | ICD-10-CM | POA: Diagnosis not present

## 2020-06-15 DIAGNOSIS — F3181 Bipolar II disorder: Secondary | ICD-10-CM | POA: Diagnosis not present

## 2020-06-21 DIAGNOSIS — M25561 Pain in right knee: Secondary | ICD-10-CM | POA: Diagnosis not present

## 2020-06-23 ENCOUNTER — Ambulatory Visit: Payer: BC Managed Care – PPO | Admitting: Internal Medicine

## 2020-06-23 DIAGNOSIS — M25669 Stiffness of unspecified knee, not elsewhere classified: Secondary | ICD-10-CM | POA: Diagnosis not present

## 2020-06-24 DIAGNOSIS — S83282A Other tear of lateral meniscus, current injury, left knee, initial encounter: Secondary | ICD-10-CM | POA: Diagnosis not present

## 2020-06-26 ENCOUNTER — Other Ambulatory Visit: Payer: Self-pay | Admitting: Internal Medicine

## 2020-06-26 DIAGNOSIS — I10 Essential (primary) hypertension: Secondary | ICD-10-CM

## 2020-07-07 ENCOUNTER — Ambulatory Visit (AMBULATORY_SURGERY_CENTER): Payer: Self-pay

## 2020-07-07 ENCOUNTER — Other Ambulatory Visit: Payer: Self-pay

## 2020-07-07 VITALS — Ht 74.0 in | Wt 224.8 lb

## 2020-07-07 DIAGNOSIS — K51019 Ulcerative (chronic) pancolitis with unspecified complications: Secondary | ICD-10-CM

## 2020-07-07 MED ORDER — NA SULFATE-K SULFATE-MG SULF 17.5-3.13-1.6 GM/177ML PO SOLN: 1.0000 | Freq: Once | ORAL | 0 refills | Status: AC

## 2020-07-07 NOTE — Progress Notes (Signed)
No allergies to soy or egg Pt is not on blood thinners or diet pills Denies issues with sedation/intubation Denies atrial flutter/fib Denies constipation   Emmi instructions given to pt  Pt is aware of Covid safety and care partner requirements.

## 2020-07-08 ENCOUNTER — Encounter: Payer: Self-pay | Admitting: Internal Medicine

## 2020-07-12 DIAGNOSIS — M25561 Pain in right knee: Secondary | ICD-10-CM | POA: Diagnosis not present

## 2020-07-12 DIAGNOSIS — F3181 Bipolar II disorder: Secondary | ICD-10-CM | POA: Diagnosis not present

## 2020-07-14 ENCOUNTER — Other Ambulatory Visit: Payer: Self-pay | Admitting: Internal Medicine

## 2020-07-14 DIAGNOSIS — E785 Hyperlipidemia, unspecified: Secondary | ICD-10-CM

## 2020-07-14 NOTE — Telephone Encounter (Signed)
Es, can you please call this pt about his coverage if you haven't already.  This was sent when I was out of the office. Thanks

## 2020-07-19 ENCOUNTER — Other Ambulatory Visit: Payer: Self-pay | Admitting: Internal Medicine

## 2020-07-19 DIAGNOSIS — N401 Enlarged prostate with lower urinary tract symptoms: Secondary | ICD-10-CM

## 2020-07-19 DIAGNOSIS — R3911 Hesitancy of micturition: Secondary | ICD-10-CM

## 2020-07-19 MED ORDER — ALFUZOSIN HCL ER 10 MG PO TB24: 10.0000 mg | ORAL_TABLET | Freq: Every day | ORAL | 1 refills | Status: DC

## 2020-07-19 MED ORDER — DUTASTERIDE 0.5 MG PO CAPS: 0.5000 mg | ORAL_CAPSULE | Freq: Every day | ORAL | 1 refills | Status: DC

## 2020-07-21 ENCOUNTER — Encounter: Payer: BC Managed Care – PPO | Admitting: Gastroenterology

## 2020-07-22 DIAGNOSIS — M25561 Pain in right knee: Secondary | ICD-10-CM | POA: Diagnosis not present

## 2020-07-25 DIAGNOSIS — M25561 Pain in right knee: Secondary | ICD-10-CM | POA: Diagnosis not present

## 2020-07-25 DIAGNOSIS — M25669 Stiffness of unspecified knee, not elsewhere classified: Secondary | ICD-10-CM | POA: Diagnosis not present

## 2020-07-26 ENCOUNTER — Ambulatory Visit: Payer: BC Managed Care – PPO | Attending: Internal Medicine

## 2020-07-26 DIAGNOSIS — Z23 Encounter for immunization: Secondary | ICD-10-CM

## 2020-07-26 NOTE — Progress Notes (Signed)
   Covid-19 Vaccination Clinic  Name:  [name]    MRN: [id number] DOB: [birth year]  07/26/2020  Mr. [name] was observed post Covid-19 immunization for 15 minutes without incident. He was provided with Vaccine Information Sheet and instruction to access the V-Safe system.   Mr. [name] was instructed to call 911 with any severe reactions post vaccine: Marland Kitchen Difficulty breathing  . Swelling of face and throat  . A fast heartbeat  . A bad rash all over body  . Dizziness and weakness   Immunizations Administered    Name Date Dose VIS Date Route   Pfizer COVID-19 Vaccine 07/26/2020  1:31 PM 0.3 mL 06/22/2020 Intramuscular   Manufacturer: [place]   Lot: [id number]   [place]: [address]-3

## 2020-08-01 ENCOUNTER — Other Ambulatory Visit: Payer: Self-pay | Admitting: Internal Medicine

## 2020-08-01 NOTE — Telephone Encounter (Signed)
Dr. Havery Moros, just an FYI.

## 2020-08-03 ENCOUNTER — Other Ambulatory Visit: Payer: Self-pay

## 2020-08-03 MED ORDER — MESALAMINE 1.2 G PO TBEC: 2.4000 g | DELAYED_RELEASE_TABLET | Freq: Every day | ORAL | 0 refills | Status: DC

## 2020-08-04 ENCOUNTER — Encounter: Payer: BC Managed Care – PPO | Admitting: Gastroenterology

## 2020-08-05 ENCOUNTER — Encounter: Payer: BC Managed Care – PPO | Admitting: Gastroenterology

## 2020-08-17 DIAGNOSIS — F3181 Bipolar II disorder: Secondary | ICD-10-CM | POA: Diagnosis not present

## 2020-08-28 ENCOUNTER — Other Ambulatory Visit: Payer: Self-pay | Admitting: Internal Medicine

## 2020-08-30 ENCOUNTER — Telehealth: Payer: Self-pay | Admitting: Internal Medicine

## 2020-08-30 NOTE — Telephone Encounter (Signed)
I left a very detailed message for the patient that he can't get Dr. Carlean Purl on his list of providers to message is he has not had an office visit with him in one year.  He has scheduled, but did not keep any scheduled appointments.  He is advised to call if he has questions for the provider.

## 2020-08-30 NOTE — Telephone Encounter (Signed)
Inbound call from patient stating he needed to have my chart updated so he can be able to send messages to Dr. Carlean Purl.  Informed patient will need to call my chart number, [phone] since we are unable to assist him with that. Patient said he already called that number and was informed that the office would be the one to handle that request.  When patient was informed that I would need to send a message to the nurse to also try and assist he became very upset and stated that he would be going to another GI specialist since we can not help him with this simple request.  Do you know if there is anything that we can do for him?  Please advise.

## 2020-09-01 DIAGNOSIS — Z85828 Personal history of other malignant neoplasm of skin: Secondary | ICD-10-CM | POA: Diagnosis not present

## 2020-09-01 DIAGNOSIS — L570 Actinic keratosis: Secondary | ICD-10-CM | POA: Diagnosis not present

## 2020-09-01 DIAGNOSIS — C44519 Basal cell carcinoma of skin of other part of trunk: Secondary | ICD-10-CM | POA: Diagnosis not present

## 2020-09-15 DIAGNOSIS — F3181 Bipolar II disorder: Secondary | ICD-10-CM | POA: Diagnosis not present

## 2020-09-22 ENCOUNTER — Encounter: Payer: Self-pay | Admitting: Internal Medicine

## 2020-09-28 ENCOUNTER — Encounter: Payer: Self-pay | Admitting: Internal Medicine

## 2020-10-12 ENCOUNTER — Other Ambulatory Visit: Payer: Self-pay | Admitting: Internal Medicine

## 2020-10-12 DIAGNOSIS — F3181 Bipolar II disorder: Secondary | ICD-10-CM | POA: Diagnosis not present

## 2020-10-12 DIAGNOSIS — E785 Hyperlipidemia, unspecified: Secondary | ICD-10-CM

## 2020-11-08 DIAGNOSIS — F3181 Bipolar II disorder: Secondary | ICD-10-CM | POA: Diagnosis not present

## 2020-12-06 DIAGNOSIS — F3181 Bipolar II disorder: Secondary | ICD-10-CM | POA: Diagnosis not present

## 2020-12-29 DIAGNOSIS — F1021 Alcohol dependence, in remission: Secondary | ICD-10-CM | POA: Diagnosis not present

## 2020-12-29 DIAGNOSIS — F3181 Bipolar II disorder: Secondary | ICD-10-CM | POA: Diagnosis not present

## 2021-01-08 ENCOUNTER — Encounter: Payer: Self-pay | Admitting: Internal Medicine

## 2021-01-09 ENCOUNTER — Encounter: Payer: Self-pay | Admitting: Internal Medicine

## 2021-01-09 ENCOUNTER — Other Ambulatory Visit: Payer: Self-pay | Admitting: Internal Medicine

## 2021-01-09 DIAGNOSIS — N401 Enlarged prostate with lower urinary tract symptoms: Secondary | ICD-10-CM

## 2021-01-09 DIAGNOSIS — R3911 Hesitancy of micturition: Secondary | ICD-10-CM

## 2021-01-09 MED ORDER — ALFUZOSIN HCL ER 10 MG PO TB24: 10.0000 mg | ORAL_TABLET | Freq: Every day | ORAL | 0 refills | Status: DC

## 2021-01-09 MED ORDER — DUTASTERIDE 0.5 MG PO CAPS: 0.5000 mg | ORAL_CAPSULE | Freq: Every day | ORAL | 0 refills | Status: DC

## 2021-01-10 ENCOUNTER — Other Ambulatory Visit: Payer: Self-pay | Admitting: Internal Medicine

## 2021-01-10 DIAGNOSIS — E785 Hyperlipidemia, unspecified: Secondary | ICD-10-CM

## 2021-01-11 ENCOUNTER — Other Ambulatory Visit: Payer: Self-pay | Admitting: Internal Medicine

## 2021-01-11 DIAGNOSIS — N401 Enlarged prostate with lower urinary tract symptoms: Secondary | ICD-10-CM

## 2021-01-11 DIAGNOSIS — R3911 Hesitancy of micturition: Secondary | ICD-10-CM

## 2021-01-14 ENCOUNTER — Encounter: Payer: Self-pay | Admitting: Internal Medicine

## 2021-01-19 ENCOUNTER — Ambulatory Visit: Payer: BC Managed Care – PPO | Admitting: Internal Medicine

## 2021-02-03 ENCOUNTER — Telehealth: Payer: Self-pay | Admitting: Internal Medicine

## 2021-02-03 MED ORDER — MESALAMINE 1.2 G PO TBEC: 2.4000 g | DELAYED_RELEASE_TABLET | Freq: Every day | ORAL | 2 refills | Status: DC

## 2021-02-03 NOTE — Telephone Encounter (Signed)
Inbound call from patient requesting additional refills for mesalamine medication be sent to [facility] on [facility] please.

## 2021-02-03 NOTE — Telephone Encounter (Signed)
Mesalamine refilled as patient requested.

## 2021-02-04 ENCOUNTER — Encounter: Payer: Self-pay | Admitting: Internal Medicine

## 2021-02-06 ENCOUNTER — Encounter: Payer: Self-pay | Admitting: Internal Medicine

## 2021-02-07 DIAGNOSIS — H2513 Age-related nuclear cataract, bilateral: Secondary | ICD-10-CM | POA: Diagnosis not present

## 2021-02-20 ENCOUNTER — Other Ambulatory Visit: Payer: Self-pay | Admitting: Internal Medicine

## 2021-02-20 DIAGNOSIS — R3911 Hesitancy of micturition: Secondary | ICD-10-CM

## 2021-02-20 DIAGNOSIS — N401 Enlarged prostate with lower urinary tract symptoms: Secondary | ICD-10-CM

## 2021-02-21 ENCOUNTER — Encounter: Payer: Self-pay | Admitting: Internal Medicine

## 2021-02-21 ENCOUNTER — Other Ambulatory Visit: Payer: Self-pay

## 2021-02-21 ENCOUNTER — Ambulatory Visit (INDEPENDENT_AMBULATORY_CARE_PROVIDER_SITE_OTHER): Payer: Medicare Other | Admitting: Internal Medicine

## 2021-02-21 VITALS — BP 160/100 | HR 69 | Temp 98.6°F | Resp 16 | Ht 74.0 in | Wt 222.0 lb

## 2021-02-21 DIAGNOSIS — R7303 Prediabetes: Secondary | ICD-10-CM | POA: Diagnosis not present

## 2021-02-21 DIAGNOSIS — E785 Hyperlipidemia, unspecified: Secondary | ICD-10-CM | POA: Diagnosis not present

## 2021-02-21 DIAGNOSIS — R9431 Abnormal electrocardiogram [ECG] [EKG]: Secondary | ICD-10-CM | POA: Diagnosis not present

## 2021-02-21 DIAGNOSIS — I10 Essential (primary) hypertension: Secondary | ICD-10-CM

## 2021-02-21 DIAGNOSIS — Z0000 Encounter for general adult medical examination without abnormal findings: Secondary | ICD-10-CM

## 2021-02-21 DIAGNOSIS — R3911 Hesitancy of micturition: Secondary | ICD-10-CM | POA: Diagnosis not present

## 2021-02-21 DIAGNOSIS — Z23 Encounter for immunization: Secondary | ICD-10-CM | POA: Insufficient documentation

## 2021-02-21 DIAGNOSIS — N401 Enlarged prostate with lower urinary tract symptoms: Secondary | ICD-10-CM

## 2021-02-21 DIAGNOSIS — E871 Hypo-osmolality and hyponatremia: Secondary | ICD-10-CM | POA: Diagnosis not present

## 2021-02-21 LAB — BASIC METABOLIC PANEL
BUN: 19 mg/dL (ref 6–23)
CO2: 25 mEq/L (ref 19–32)
Calcium: 9.5 mg/dL (ref 8.4–10.5)
Chloride: 96 mEq/L (ref 96–112)
Creatinine, Ser: 0.91 mg/dL (ref 0.40–1.50)
GFR: 88.75 mL/min (ref >60.00)
Glucose, Bld: 89 mg/dL (ref 70–99)
Potassium: 4.2 mEq/L (ref 3.5–5.1)
Sodium: 130 mEq/L — ABNORMAL LOW (ref 135–145)

## 2021-02-21 LAB — LIPID PANEL
Cholesterol: 132 mg/dL (ref 0–200)
HDL: 53.2 mg/dL (ref >39.00)
LDL Cholesterol: 67 mg/dL (ref 0–99)
NonHDL: 78.96
Total CHOL/HDL Ratio: 2
Triglycerides: 61 mg/dL (ref 0.0–149.0)
VLDL: 12.2 mg/dL (ref 0.0–40.0)

## 2021-02-21 LAB — CBC WITH DIFFERENTIAL/PLATELET
Basophils Absolute: 0.1 10*3/uL (ref 0.0–0.1)
Basophils Relative: 0.8 % (ref 0.0–3.0)
Eosinophils Absolute: 0.1 10*3/uL (ref 0.0–0.7)
Eosinophils Relative: 1.8 % (ref 0.0–5.0)
HCT: 37.8 % — ABNORMAL LOW (ref 39.0–52.0)
Hemoglobin: 13.2 g/dL (ref 13.0–17.0)
Lymphocytes Relative: 22.8 % (ref 12.0–46.0)
Lymphs Abs: 1.8 10*3/uL (ref 0.7–4.0)
MCHC: 35 g/dL (ref 30.0–36.0)
MCV: 92.3 fl (ref 78.0–100.0)
Monocytes Absolute: 0.7 10*3/uL (ref 0.1–1.0)
Monocytes Relative: 8.8 % (ref 3.0–12.0)
Neutro Abs: 5.1 10*3/uL (ref 1.4–7.7)
Neutrophils Relative %: 65.8 % (ref 43.0–77.0)
Platelets: 238 10*3/uL (ref 150.0–400.0)
RBC: 4.1 Mil/uL — ABNORMAL LOW (ref 4.22–5.81)
RDW: 12.6 % (ref 11.5–15.5)
WBC: 7.8 10*3/uL (ref 4.0–10.5)

## 2021-02-21 LAB — HEPATIC FUNCTION PANEL
ALT: 31 U/L (ref 0–53)
AST: 36 U/L (ref 0–37)
Albumin: 4.7 g/dL (ref 3.5–5.2)
Alkaline Phosphatase: 62 U/L (ref 39–117)
Bilirubin, Direct: 0.2 mg/dL (ref 0.0–0.3)
Total Bilirubin: 1 mg/dL (ref 0.2–1.2)
Total Protein: 6.8 g/dL (ref 6.0–8.3)

## 2021-02-21 LAB — URINALYSIS, ROUTINE W REFLEX MICROSCOPIC
Bilirubin Urine: NEGATIVE
Hgb urine dipstick: NEGATIVE
Ketones, ur: NEGATIVE
Leukocytes,Ua: NEGATIVE
Nitrite: NEGATIVE
Specific Gravity, Urine: 1.015 (ref 1.000–1.030)
Total Protein, Urine: NEGATIVE
Urine Glucose: NEGATIVE
Urobilinogen, UA: 0.2 (ref 0.0–1.0)
pH: 6 (ref 5.0–8.0)

## 2021-02-21 LAB — HEMOGLOBIN A1C: Hgb A1c MFr Bld: 6.2 % (ref 4.6–6.5)

## 2021-02-21 LAB — PSA: PSA: 2.26 ng/mL (ref 0.10–4.00)

## 2021-02-21 MED ORDER — OLMESARTAN MEDOXOMIL 40 MG PO TABS: 40.0000 mg | ORAL_TABLET | Freq: Every day | ORAL | 1 refills | Status: DC

## 2021-02-21 NOTE — Progress Notes (Signed)
Abnormal EKG   Subjective:  Patient ID: [name], male    DOB: [birth year]  Age: 65 y.o. MRN: [id number]  CC: Annual Exam, Hypertension, and Hyperlipidemia  This visit occurred during the SARS-CoV-2 public health emergency.  Safety protocols were in place, including screening questions prior to the visit, additional usage of staff PPE, and extensive cleaning of exam room while observing appropriate contact time as indicated for disinfecting solutions.    HPI [name] presents for a CPX and f/up -   He has not recently been taking the ARB or monitoring his blood pressure.  He is active and denies any recent episodes of chest pain, shortness of breath, diaphoresis, dizziness, or lightheadedness.  He admits he drinks a lot of water.  Outpatient Medications Prior to Visit  Medication Sig Dispense Refill   DULoxetine (CYMBALTA) 60 MG capsule Take 60 mg by mouth daily.     Ferrous Sulfate (IRON PO) Take by mouth.     fluorouracil (EFUDEX) 5 % cream fluorouracil 5 % topical cream     lamoTRIgine (LAMICTAL) 200 MG tablet   1   mesalamine (LIALDA) 1.2 g EC tablet Take 2 tablets (2.4 g total) by mouth daily. 60 tablet 2   Multiple Vitamin (MULTIVITAMIN) tablet Take 1 tablet by mouth daily.     QUEtiapine (SEROQUEL) 300 MG tablet 200 mg.      alfuzosin (UROXATRAL) 10 MG 24 hr tablet TAKE ONE TABLET BY MOUTH DAILY WITH BREAKFAST 90 tablet 0   atorvastatin (LIPITOR) 80 MG tablet TAKE ONE TABLET BY MOUTH DAILY 90 tablet 0   dutasteride (AVODART) 0.5 MG capsule TAKE ONE CAPSULE BY MOUTH DAILY 90 capsule 0   olmesartan (BENICAR) 40 MG tablet TAKE ONE TABLET BY MOUTH DAILY 90 tablet 1   Omega-3 Fatty Acids (FISH OIL) 1200 MG CAPS Take by mouth 2 (two) times daily.       No facility-administered medications prior to visit.    ROS Review of Systems  Constitutional:  Negative for diaphoresis, fatigue and unexpected weight change.  HENT: Negative.  Negative for sore throat.   Eyes:  Negative  for visual disturbance.  Respiratory:  Negative for cough, chest tightness, shortness of breath and wheezing.   Cardiovascular:  Negative for chest pain, palpitations and leg swelling.  Gastrointestinal:  Negative for abdominal pain, constipation, diarrhea and vomiting.  Endocrine: Negative.   Genitourinary:  Positive for difficulty urinating. Negative for dysuria, frequency, scrotal swelling, testicular pain and urgency.       Weak urine stream  Musculoskeletal:  Negative for arthralgias and myalgias.  Skin: Negative.   Neurological:  Negative for dizziness, seizures, weakness, light-headedness and numbness.  Hematological:  Negative for adenopathy. Does not bruise/bleed easily.  Psychiatric/Behavioral: Negative.  Negative for dysphoric mood. The patient is not nervous/anxious.    Objective:  BP (!) 160/100 (BP Location: Left Arm, Patient Position: Sitting, Cuff Size: Large)   Pulse 69   Temp 98.6 F (37 C) (Oral)   Resp 16   Ht 6' 2"  (1.88 m)   Wt 222 lb (100.7 kg)   SpO2 99%   BMI 28.50 kg/m   BP Readings from Last 3 Encounters:  02/21/21 (!) 160/100  05/31/20 128/82  02/08/20 131/74    Wt Readings from Last 3 Encounters:  02/21/21 222 lb (100.7 kg)  07/07/20 224 lb 12.8 oz (102 kg)  05/31/20 226 lb (102.5 kg)    Physical Exam Vitals reviewed.  Constitutional:      Appearance: Normal  appearance.  HENT:     Nose: Nose normal.     Mouth/Throat:     Mouth: Mucous membranes are moist.  Eyes:     General: No scleral icterus.    Conjunctiva/sclera: Conjunctivae normal.  Cardiovascular:     Rate and Rhythm: Normal rate and regular rhythm.     Heart sounds: Normal heart sounds, S1 normal and S2 normal. No murmur heard.   No friction rub. No gallop.     Comments: EKG- SR with rare PAC, 60 bpm Anterior voltage loss is unchanged No Q waves or LVH Pulmonary:     Effort: Pulmonary effort is normal.     Breath sounds: No stridor. No wheezing, rhonchi or rales.   Abdominal:     General: Abdomen is flat. Bowel sounds are normal. There is no distension.     Palpations: Abdomen is soft. There is no hepatomegaly, splenomegaly or mass.     Tenderness: There is no abdominal tenderness. There is no guarding.     Hernia: No hernia is present. There is no hernia in the left inguinal area or right inguinal area.  Genitourinary:    Pubic Area: No rash.      Penis: Normal and circumcised.      Testes: Normal.     Epididymis:     Right: Normal.     Left: Normal.     Prostate: Normal. Not enlarged, not tender and no nodules present.     Rectum: Normal. Guaiac result negative. No mass, tenderness, anal fissure, external hemorrhoid or internal hemorrhoid. Normal anal tone.  Musculoskeletal:        General: No swelling. Normal range of motion.     Right lower leg: No edema.     Left lower leg: No edema.  Lymphadenopathy:     Lower Body: No right inguinal adenopathy. No left inguinal adenopathy.  Skin:    General: Skin is warm and dry.     Coloration: Skin is not pale.  Neurological:     General: No focal deficit present.     Mental Status: He is alert and oriented to person, place, and time. Mental status is at baseline.  Psychiatric:        Mood and Affect: Mood normal.        Behavior: Behavior normal.    Lab Results  Component Value Date   WBC 7.8 02/21/2021   HGB 13.2 02/21/2021   HCT 37.8 (L) 02/21/2021   PLT 238.0 02/21/2021   GLUCOSE 89 02/21/2021   CHOL 132 02/21/2021   TRIG 61.0 02/21/2021   HDL 53.20 02/21/2021   LDLDIRECT 102.0 09/09/2017   LDLCALC 67 02/21/2021   ALT 31 02/21/2021   AST 36 02/21/2021   NA 130 (L) 02/21/2021   K 4.2 02/21/2021   CL 96 02/21/2021   CREATININE 0.91 02/21/2021   BUN 19 02/21/2021   CO2 25 02/21/2021   TSH 0.79 02/03/2020   PSA 2.26 02/21/2021   INR 0.9 03/08/2008   HGBA1C 6.2 02/21/2021    DG Knee Complete 4 Views Right  Result Date: 02/07/2020 CLINICAL DATA:  Status post fall. EXAM: RIGHT  KNEE - COMPLETE 4+ VIEW COMPARISON:  None. FINDINGS: No evidence of acute fracture or dislocation. No evidence of arthropathy or other focal bone abnormality. A large joint effusion is seen. There is moderate severity vascular calcification. IMPRESSION: Large joint effusion without evidence of acute fracture. Electronically Signed   By: Virgina Norfolk M.D.   On: 02/07/2020 23:59  Assessment & Plan:   Jacarie was seen today for annual exam, hypertension and hyperlipidemia.  Diagnoses and all orders for this visit:  Essential hypertension- His blood pressure is not adequately well controlled.  Will restart the ARB.  His sodium level is low so I recommended that he start taking the loop diuretic. -     CBC with Differential/Platelet; Future -     Urinalysis, Routine w reflex microscopic; Future -     EKG 12-Lead -     olmesartan (BENICAR) 40 MG tablet; Take 1 tablet (40 mg total) by mouth daily. -     Urinalysis, Routine w reflex microscopic -     CBC with Differential/Platelet -     torsemide (DEMADEX) 20 MG tablet; Take 1 tablet (20 mg total) by mouth daily.  Benign prostatic hyperplasia with urinary hesitancy- His symptoms are adequately well controlled. -     PSA; Future -     Urinalysis, Routine w reflex microscopic; Future -     Urinalysis, Routine w reflex microscopic -     PSA -     Discontinue: dutasteride (AVODART) 0.5 MG capsule; Take 1 capsule (0.5 mg total) by mouth daily. -     alfuzosin (UROXATRAL) 10 MG 24 hr tablet; Take 1 tablet (10 mg total) by mouth daily with breakfast.  Dyslipidemia, goal LDL below 100- He has achieved his LDL goal and is doing well on the statin. -     Lipid panel; Future -     Hepatic function panel; Future -     Hepatic function panel -     Lipid panel -     atorvastatin (LIPITOR) 80 MG tablet; Take 1 tablet (80 mg total) by mouth daily.  Prediabetes- His A1c is at 6.2%.  Medical therapy is not indicated. -     Basic metabolic panel;  Future -     Hemoglobin A1c; Future -     Hemoglobin A1c -     Basic metabolic panel  Routine general medical examination at a health care facility-exam completed, labs reviewed, vaccines reviewed and updated, cancer screenings are up-to-date, patient education was given.  Need for vaccination -     Pneumococcal conjugate vaccine 20-valent (Prevnar 20)  Abnormal electrocardiogram (ECG) (EKG)- I have asked him to see cardiology about this. -     Ambulatory referral to Cardiology  Chronic hyponatremia- This could be psychogenic polydipsia so I have asked him to decrease his water intake by 50%. -     torsemide (DEMADEX) 20 MG tablet; Take 1 tablet (20 mg total) by mouth daily.  I have discontinued Shanon Brow Reiland's Fish Oil and dutasteride. I have also changed his olmesartan, atorvastatin, and alfuzosin. Additionally, I am having him start on torsemide. Lastly, I am having him maintain his DULoxetine, lamoTRIgine, multivitamin, QUEtiapine, Ferrous Sulfate (IRON PO), fluorouracil, and mesalamine.  Meds ordered this encounter  Medications   olmesartan (BENICAR) 40 MG tablet    Sig: Take 1 tablet (40 mg total) by mouth daily.    Dispense:  90 tablet    Refill:  1   DISCONTD: dutasteride (AVODART) 0.5 MG capsule    Sig: Take 1 capsule (0.5 mg total) by mouth daily.    Dispense:  90 capsule    Refill:  1   atorvastatin (LIPITOR) 80 MG tablet    Sig: Take 1 tablet (80 mg total) by mouth daily.    Dispense:  90 tablet    Refill:  1  alfuzosin (UROXATRAL) 10 MG 24 hr tablet    Sig: Take 1 tablet (10 mg total) by mouth daily with breakfast.    Dispense:  90 tablet    Refill:  1   torsemide (DEMADEX) 20 MG tablet    Sig: Take 1 tablet (20 mg total) by mouth daily.    Dispense:  90 tablet    Refill:  0      Follow-up: Return in about 3 months (around 05/24/2021).  [name], MD

## 2021-02-21 NOTE — Patient Instructions (Signed)

## 2021-02-22 ENCOUNTER — Telehealth: Payer: Self-pay | Admitting: Internal Medicine

## 2021-02-22 ENCOUNTER — Encounter: Payer: Self-pay | Admitting: Internal Medicine

## 2021-02-22 DIAGNOSIS — E871 Hypo-osmolality and hyponatremia: Secondary | ICD-10-CM | POA: Insufficient documentation

## 2021-02-22 MED ORDER — TORSEMIDE 20 MG PO TABS
20.0000 mg | ORAL_TABLET | Freq: Every day | ORAL | 0 refills | Status: DC
Start: 2021-02-22 — End: 2021-04-25

## 2021-02-22 MED ORDER — DUTASTERIDE 0.5 MG PO CAPS: 0.5000 mg | ORAL_CAPSULE | Freq: Every day | ORAL | 1 refills | Status: DC

## 2021-02-22 MED ORDER — ALFUZOSIN HCL ER 10 MG PO TB24: 10.0000 mg | ORAL_TABLET | Freq: Every day | ORAL | 1 refills | Status: DC

## 2021-02-22 MED ORDER — ATORVASTATIN CALCIUM 80 MG PO TABS: 1.0000 | ORAL_TABLET | Freq: Every day | ORAL | 1 refills | Status: DC

## 2021-02-22 NOTE — Telephone Encounter (Signed)
Patient called and said that dutasteride (AVODART) 0.5 MG capsule did not need to be sent to [facility]. He will call back later today and let us know where it needs to be sent. Please advise

## 2021-02-22 NOTE — Telephone Encounter (Signed)
Noted  

## 2021-02-23 ENCOUNTER — Encounter: Payer: Self-pay | Admitting: Internal Medicine

## 2021-02-23 ENCOUNTER — Telehealth: Payer: Self-pay

## 2021-02-23 NOTE — Telephone Encounter (Signed)
Changed order to [facility] for Mesalamine and sent patient MyChart I.T. phone # for problems he is having with MyChart.

## 2021-02-24 ENCOUNTER — Encounter: Payer: Self-pay | Admitting: Internal Medicine

## 2021-02-24 DIAGNOSIS — N401 Enlarged prostate with lower urinary tract symptoms: Secondary | ICD-10-CM

## 2021-02-24 MED ORDER — DUTASTERIDE 0.5 MG PO CAPS: 0.5000 mg | ORAL_CAPSULE | Freq: Every day | ORAL | 1 refills | Status: DC

## 2021-03-01 ENCOUNTER — Encounter: Payer: Self-pay | Admitting: Internal Medicine

## 2021-03-13 DIAGNOSIS — Z85828 Personal history of other malignant neoplasm of skin: Secondary | ICD-10-CM | POA: Diagnosis not present

## 2021-03-13 DIAGNOSIS — L821 Other seborrheic keratosis: Secondary | ICD-10-CM | POA: Diagnosis not present

## 2021-03-13 DIAGNOSIS — C44519 Basal cell carcinoma of skin of other part of trunk: Secondary | ICD-10-CM | POA: Diagnosis not present

## 2021-03-21 ENCOUNTER — Ambulatory Visit: Payer: Medicare Other | Admitting: Cardiology

## 2021-03-21 ENCOUNTER — Other Ambulatory Visit: Payer: Self-pay

## 2021-03-21 ENCOUNTER — Encounter: Payer: Self-pay | Admitting: Cardiology

## 2021-03-21 VITALS — BP 132/74 | HR 73 | Temp 98.0°F | Resp 16 | Ht 74.0 in | Wt 220.2 lb

## 2021-03-21 DIAGNOSIS — E785 Hyperlipidemia, unspecified: Secondary | ICD-10-CM | POA: Diagnosis not present

## 2021-03-21 DIAGNOSIS — I10 Essential (primary) hypertension: Secondary | ICD-10-CM | POA: Diagnosis not present

## 2021-03-21 DIAGNOSIS — I491 Atrial premature depolarization: Secondary | ICD-10-CM

## 2021-03-21 DIAGNOSIS — R9431 Abnormal electrocardiogram [ECG] [EKG]: Secondary | ICD-10-CM | POA: Diagnosis not present

## 2021-03-21 NOTE — Progress Notes (Signed)
Date:  03/21/2021   ID:  [name], DOB 29-Aug-1956, MRN [id number]  PCP:  Janith Lima, MD  Cardiologist:  Rex Kras, DO, [facility] (established care 03/21/2021)  REASON FOR CONSULT: Abnormal EKG  REQUESTING PHYSICIAN:  Janith Lima, MD [address] [address],  [place] [id number]  Chief Complaint  Patient presents with   Abnormal ECG   New Patient (Initial Visit)    HPI  [name] is a 65 y.o. male who presents to the office with a chief complaint of " abnormal EKG." Patient's past medical history and cardiovascular risk factors include: Dyslipidemia, prediabetes, hypertension, advanced age.  He is referred to the office at the request of Janith Lima, MD for evaluation of abnormal EKG.  Patient recently went to his PCP for annual visit and was noted to have an abnormal EKG and given his risk factors was referred to cardiology for further evaluation and management.  The EKG performed in June 2022 reviewed underlying rhythm is normal sinus with PACs.  Clinically patient denies any chest pain or shortness of breath at rest or with effort related activities.  Recently since being retired he goes to the gym on a regular basis for at least 2.5 hours and enjoys swimming, treadmill, and resistance training.  He does not have any effort related symptoms.  His overall physical endurance has not changed.  Patient's home blood pressures are consistently less than 130 mmHg after being restarted on ARB by his primary care provider.  Given his underlying dyslipidemia patient is under the care of his PCP with regards to being on atorvastatin.  Most recent lipid profile from June 2022 independently reviewed and his LDL is less than 70 mg/dL.  FUNCTIONAL STATUS: Goes to the gym for about 2hrs and 69mn. Enjoys swimming, resistant training, and treadmill.    ALLERGIES: No Known Allergies  MEDICATION LIST PRIOR TO VISIT: Current Meds  Medication Sig   alfuzosin (UROXATRAL) 10  MG 24 hr tablet Take 1 tablet (10 mg total) by mouth daily with breakfast.   amoxicillin (AMOXIL) 500 MG capsule Take 500 mg by mouth 2 (two) times daily.   atorvastatin (LIPITOR) 80 MG tablet Take 1 tablet (80 mg total) by mouth daily.   DULoxetine (CYMBALTA) 60 MG capsule Take 60 mg by mouth daily.   dutasteride (AVODART) 0.5 MG capsule Take 1 capsule (0.5 mg total) by mouth daily.   Fish Oil-Cholecalciferol (FISH OIL + D3 PO) Take 1 capsule by mouth daily.   fluorouracil (EFUDEX) 5 % cream Apply topically as needed.   lamoTRIgine (LAMICTAL) 150 MG tablet Take 150 mg by mouth 2 (two) times daily.   Multiple Vitamin (MULTIVITAMIN) tablet Take 1 tablet by mouth daily.   olmesartan (BENICAR) 40 MG tablet Take 1 tablet (40 mg total) by mouth daily.   QUEtiapine (SEROQUEL) 200 MG tablet Take 200 mg by mouth at bedtime.   torsemide (DEMADEX) 20 MG tablet Take 1 tablet (20 mg total) by mouth daily.     PAST MEDICAL HISTORY: Past Medical History:  Diagnosis Date   [place]ism ([place]    [place]   Bipolar 2 disorder ([place]    [place] ([place]    skin, basal cell   Depression    Hemorrhoids 11-23-2010   Colonoscopy with [facility]   Hyperlipemia    Hypertension    Muscle spasm of back    Personal history of colonic polyps 11-23-2010   5 mm adenoma Colonoscopy  with [facility]   Rosacea    Substance abuse ([place])    alcohol   Ulcerative colitis 11-23-2010   Colonoscopy with [facility]    PAST SURGICAL HISTORY: Past Surgical History:  Procedure Laterality Date   ACHILLES TENDON REPAIR  1995   BASAL CELL CARCINOMA EXCISION     COLONOSCOPY  2019   COLONOSCOPY W/ BIOPSIES  2003, 04/2008, 11/2010   2003: proctitis 2009: ulcerative colitis and hemorrhoids 2012: ulcerative colitis and 65m sigmoid  tubular adenoma   MOHS SURGERY  2006   QUADRICEPS TENDON REPAIR     SHOULDER SURGERY      FAMILY HISTORY: The patient family history is not on file. He was adopted.  SOCIAL HISTORY:  The  patient  reports that he has never smoked. He has never used smokeless tobacco. He reports previous alcohol use of about 14.0 standard drinks of alcohol per week. He reports that he does not use drugs.  REVIEW OF SYSTEMS: Review of Systems  Constitutional: Negative for chills and fever.  HENT:  Negative for hoarse voice and nosebleeds.   Eyes:  Negative for discharge, double vision and pain.  Cardiovascular:  Negative for chest pain, claudication, dyspnea on exertion, leg swelling, near-syncope, orthopnea, palpitations, paroxysmal nocturnal dyspnea and syncope.  Respiratory:  Negative for hemoptysis and shortness of breath.   Musculoskeletal:  Negative for muscle cramps and myalgias.  Gastrointestinal:  Negative for abdominal pain, constipation, diarrhea, hematemesis, hematochezia, melena, nausea and vomiting.  Neurological:  Negative for dizziness and light-headedness.  All other systems reviewed and are negative.  PHYSICAL EXAM: Vitals with BMI 03/21/2021 02/21/2021 07/07/2020  Height 6' 2"  6' 2"  6' 2"   Weight 220 lbs 3 oz 222 lbs 224 lbs 13 oz  BMI 28.26 [id number] Systolic [id number]  Diastolic 74 1867-  Pulse 73 69 -    CONSTITUTIONAL: Well-developed and well-nourished. No acute distress.  SKIN: Skin is warm and dry. No rash noted. No cyanosis. No pallor. No jaundice HEAD: Normocephalic and atraumatic.  EYES: No scleral icterus MOUTH/THROAT: Moist oral membranes.  NECK: No JVD present. No thyromegaly noted. No carotid bruits  LYMPHATIC: No visible cervical adenopathy.  CHEST Normal respiratory effort. No intercostal retractions  LUNGS: Clear to auscultation bilaterally.  No stridor. No wheezes. No rales.  CARDIOVASCULAR: Regular, positive S1-S2, no murmurs rubs or gallops appreciated. ABDOMINAL: Nonobese, soft, nontender, nondistended, positive bowel sounds in all 4 quadrants no apparent ascites.  EXTREMITIES: No peripheral edema, 2+ dorsalis pedis and posterior tibial pulses.   Poor nail hygiene. HEMATOLOGIC: No significant bruising NEUROLOGIC: Oriented to person, place, and time. Nonfocal. Normal muscle tone.  PSYCHIATRIC: Normal mood and affect. Normal behavior. Cooperative  CARDIAC DATABASE: EKG: 02/21/2021: Normal sinus rhythm, 61 bpm, left axis deviation, poor R wave progression, occasional PACs, without underlying ischemia or injury pattern.  03/21/2021: Normal sinus rhythm, 64 bpm, left axis deviation, left anterior fascicular block, poor R wave progression, without underlying ischemia or injury pattern.  Echocardiogram: No results found for this or any previous visit from the past 1095 days.   Stress Testing: No results found for this or any previous visit from the past 1095 days.  Heart Catheterization: None  LABORATORY DATA: CBC Latest Ref Rng & Units 02/21/2021 05/31/2020 02/03/2020  WBC 4.0 - 10.5 K/uL 7.8 6.7 6.3  Hemoglobin 13.0 - 17.0 g/dL 13.2 13.8 13.2  Hematocrit 39.0 - 52.0 % 37.8(L) 40.6 38.0(L)  Platelets 150.0 - 400.0 K/uL 238.0 265 254.0  CMP Latest Ref Rng & Units 02/21/2021 05/31/2020 02/03/2020  Glucose 70 - 99 mg/dL 89 98 101(H)  BUN 6 - 23 mg/dL 19 13 12   Creatinine 0.40 - 1.50 mg/dL 0.91 0.82 0.87  Sodium 135 - 145 mEq/L 130(L) 135 132(L)  Potassium 3.5 - 5.1 mEq/L 4.2 4.5 4.0  Chloride 96 - 112 mEq/L 96 102 99  CO2 19 - 32 mEq/L 25 25 27   Calcium 8.4 - 10.5 mg/dL 9.5 9.6 9.6  Total Protein 6.0 - 8.3 g/dL 6.8 - 6.8  Total Bilirubin 0.2 - 1.2 mg/dL 1.0 - 0.9  Alkaline Phos 39 - 117 U/L 62 - 59  AST 0 - 37 U/L 36 - 26  ALT 0 - 53 U/L 31 - 26    Lipid Panel  .  No components found for: NTPROBNP No results for input(s): PROBNP in the last 8760 hours. No results for input(s): TSH in the last 8760 hours.  BMP Recent Labs    05/31/20 0843 02/21/21 1141  NA 135 130*  K 4.5 4.2  CL 102 96  CO2 25 25  GLUCOSE 98 89  BUN 13 19  CREATININE 0.82 0.91  CALCIUM 9.6 9.5  GFRNONAA 93  --   GFRAA 108  --     HEMOGLOBIN  A1C Lab Results  Component Value Date   HGBA1C 6.2 02/21/2021    IMPRESSION:    ICD-10-CM   1. Nonspecific abnormal electrocardiogram (ECG) (EKG)  R94.31 EKG 12-Lead    PCV ECHOCARDIOGRAM COMPLETE    2. PAC (premature atrial contraction)  I49.1     3. Benign hypertension  I10     4. Dyslipidemia, goal LDL below 100  E78.5 CT CARDIAC SCORING ([facility] LOCATIONS ONLY)       RECOMMENDATIONS: [name] is a 65 y.o. male whose past medical history and cardiac risk factors include: Dyslipidemia, prediabetes, hypertension, advanced age.  Abnormal EKG: Patient's last EKG from June 2022 reviewed.  Underlying rhythm is normal sinus and occasional PACs.  This finding was not really illustrated at today's office visit.  Patient is overall asymptomatic without any palpitations. Labs from June 2022 independently reviewed: Kidney function, electrolytes, TSH within normal limits. EKG shows normal sinus rhythm without underlying ischemia or injury pattern.  He has excellent functional capacity for age.  We will hold off on GXT for now unless a change in clinical status or findings of CAC. Monitor for now  Benign essential hypertension: Office blood pressures are currently at goal. Medications reconciled. Currently managed by primary care provider.  Dyslipidemia: Most recent lipid profile from June 2022 reviewed.  LDL is less than 70 mg/dL. Recommend coronary calcium scoring for further risk stratification.   FINAL MEDICATION LIST END OF ENCOUNTER: No orders of the defined types were placed in this encounter.   Medications Discontinued During This Encounter  Medication Reason   Ferrous Sulfate (IRON PO) Error   mesalamine (LIALDA) 1.2 g EC tablet Error     Current Outpatient Medications:    alfuzosin (UROXATRAL) 10 MG 24 hr tablet, Take 1 tablet (10 mg total) by mouth daily with breakfast., Disp: 90 tablet, Rfl: 1   amoxicillin (AMOXIL) 500 MG capsule, Take 500 mg by mouth 2 (two)  times daily., Disp: , Rfl:    atorvastatin (LIPITOR) 80 MG tablet, Take 1 tablet (80 mg total) by mouth daily., Disp: 90 tablet, Rfl: 1   DULoxetine (CYMBALTA) 60 MG capsule, Take 60 mg by mouth daily., Disp: , Rfl:  dutasteride (AVODART) 0.5 MG capsule, Take 1 capsule (0.5 mg total) by mouth daily., Disp: 90 capsule, Rfl: 1   Fish Oil-Cholecalciferol (FISH OIL + D3 PO), Take 1 capsule by mouth daily., Disp: , Rfl:    fluorouracil (EFUDEX) 5 % cream, Apply topically as needed., Disp: , Rfl:    lamoTRIgine (LAMICTAL) 150 MG tablet, Take 150 mg by mouth 2 (two) times daily., Disp: , Rfl: 1   Multiple Vitamin (MULTIVITAMIN) tablet, Take 1 tablet by mouth daily., Disp: , Rfl:    olmesartan (BENICAR) 40 MG tablet, Take 1 tablet (40 mg total) by mouth daily., Disp: 90 tablet, Rfl: 1   QUEtiapine (SEROQUEL) 200 MG tablet, Take 200 mg by mouth at bedtime., Disp: , Rfl:    torsemide (DEMADEX) 20 MG tablet, Take 1 tablet (20 mg total) by mouth daily., Disp: 90 tablet, Rfl: 0  Orders Placed This Encounter  Procedures   CT CARDIAC SCORING ([facility] LOCATIONS ONLY)   EKG 12-Lead   PCV ECHOCARDIOGRAM COMPLETE    There are no Patient Instructions on file for this visit.   --Continue cardiac medications as reconciled in final medication list. --Return in about 4 weeks (around 04/18/2021) for Review test results. Or sooner if needed. --Continue follow-up with your primary care physician regarding the management of your other chronic comorbid conditions.  Patient's questions and concerns were addressed to his satisfaction. He voices understanding of the instructions provided during this encounter.   This note was created using a voice recognition software as a result there may be grammatical errors inadvertently enclosed that do not reflect the nature of this encounter. Every attempt is made to correct such errors.  Rex Kras, [place], [facility]  Pager: [phone] Office: [phone]

## 2021-03-23 ENCOUNTER — Other Ambulatory Visit: Payer: Self-pay

## 2021-03-23 ENCOUNTER — Ambulatory Visit: Payer: Medicare Other

## 2021-03-23 DIAGNOSIS — R9431 Abnormal electrocardiogram [ECG] [EKG]: Secondary | ICD-10-CM

## 2021-04-05 ENCOUNTER — Encounter: Payer: Self-pay | Admitting: Internal Medicine

## 2021-04-05 ENCOUNTER — Other Ambulatory Visit: Payer: Self-pay

## 2021-04-05 ENCOUNTER — Ambulatory Visit (INDEPENDENT_AMBULATORY_CARE_PROVIDER_SITE_OTHER): Payer: Medicare Other | Admitting: Internal Medicine

## 2021-04-05 VITALS — BP 128/78 | HR 68 | Temp 98.0°F | Ht 74.0 in | Wt 221.0 lb

## 2021-04-05 DIAGNOSIS — I10 Essential (primary) hypertension: Secondary | ICD-10-CM

## 2021-04-05 DIAGNOSIS — E871 Hypo-osmolality and hyponatremia: Secondary | ICD-10-CM | POA: Diagnosis not present

## 2021-04-05 DIAGNOSIS — D539 Nutritional anemia, unspecified: Secondary | ICD-10-CM

## 2021-04-05 LAB — CBC WITH DIFFERENTIAL/PLATELET
Basophils Absolute: 0 10*3/uL (ref 0.0–0.1)
Basophils Relative: 0.9 % (ref 0.0–3.0)
Eosinophils Absolute: 0.1 10*3/uL (ref 0.0–0.7)
Eosinophils Relative: 2.5 % (ref 0.0–5.0)
HCT: 37.3 % — ABNORMAL LOW (ref 39.0–52.0)
Hemoglobin: 12.8 g/dL — ABNORMAL LOW (ref 13.0–17.0)
Lymphocytes Relative: 26.4 % (ref 12.0–46.0)
Lymphs Abs: 1.4 10*3/uL (ref 0.7–4.0)
MCHC: 34.3 g/dL (ref 30.0–36.0)
MCV: 94 fl (ref 78.0–100.0)
Monocytes Absolute: 0.5 10*3/uL (ref 0.1–1.0)
Monocytes Relative: 9.8 % (ref 3.0–12.0)
Neutro Abs: 3.2 10*3/uL (ref 1.4–7.7)
Neutrophils Relative %: 60.4 % (ref 43.0–77.0)
Platelets: 224 10*3/uL (ref 150.0–400.0)
RBC: 3.96 Mil/uL — ABNORMAL LOW (ref 4.22–5.81)
RDW: 12.7 % (ref 11.5–15.5)
WBC: 5.3 10*3/uL (ref 4.0–10.5)

## 2021-04-05 LAB — VITAMIN B12: Vitamin B-12: 477 pg/mL (ref 211–911)

## 2021-04-05 LAB — BASIC METABOLIC PANEL
BUN: 20 mg/dL (ref 6–23)
CO2: 27 mEq/L (ref 19–32)
Calcium: 9.4 mg/dL (ref 8.4–10.5)
Chloride: 98 mEq/L (ref 96–112)
Creatinine, Ser: 1 mg/dL (ref 0.40–1.50)
GFR: 79.19 mL/min (ref >60.00)
Glucose, Bld: 96 mg/dL (ref 70–99)
Potassium: 4.5 mEq/L (ref 3.5–5.1)
Sodium: 134 mEq/L — ABNORMAL LOW (ref 135–145)

## 2021-04-05 LAB — IRON: Iron: 116 ug/dL (ref 42–165)

## 2021-04-05 LAB — FERRITIN: Ferritin: 132.8 ng/mL (ref 22.0–322.0)

## 2021-04-05 LAB — FOLATE: Folate: >24.4 ng/mL (ref >5.9)

## 2021-04-05 LAB — CORTISOL: Cortisol, Plasma: 8 ug/dL

## 2021-04-05 NOTE — Patient Instructions (Signed)
Hyponatremia Hyponatremia is when the amount of salt (sodium) in your blood is too low. When sodium levels are low, your cells absorb extra water, which causes them to swell. The swelling happens throughout the body,but it mostly affects the brain. What are the causes? This condition may be caused by: Certain medical conditions, such as: Heart, kidney, or liver problems. Thyroid problems. Adrenal gland problems. Metabolic conditions, such as Addison disease or syndrome of inappropriate antidiuretic hormone (SIADH). Severe vomiting or diarrhea. Certain medicines or illegal drugs. Dehydration. Drinking too much water. Eating a diet that is low in sodium. Large burns on your body. Excessive sweating. What increases the risk? You are more likely to develop this condition if you: Have long-term (chronic) kidney disease. Have heart failure. Have a medical condition that causes frequent or excessive diarrhea. Participate in intense physical activities, such as marathon running. Take certain medicines that affect the sodium and fluid balance in the blood. Some of these medicine types include: Diuretics. NSAIDs. Some opioid pain medicines. Some antidepressants. Some seizure prevention medicines. What are the signs or symptoms? Symptoms of this condition include: Headache. Nausea and vomiting. Being very tired (lethargic). Muscle weakness and cramping. Loss of appetite. Feeling weak or light-headed. Severe symptoms of this condition include: Confusion. Agitation. Having a rapid heart rate. Passing out (fainting). Seizures. Coma. How is this diagnosed? This condition is diagnosed based on: A physical exam. Your medical history. Tests, including: Blood tests. Urine tests. How is this treated? Treatment for this condition depends on the cause. Treatment may include: Getting fluids through an IV that is inserted into one of your veins. Medicines to correct the sodium imbalance.  If medicines are causing the condition, the medicines will need to be adjusted. Limiting your water or fluid intake to get the correct sodium balance. Monitoring in the hospital setting to closely watch your symptoms for improvement. Follow these instructions at home:  Take over-the-counter and prescription medicines only as told by your health care provider. Many medicines can make this condition worse. Talk with your health care provider about any medicines that you are currently taking. Carefully follow a recommended diet as told by your health care provider. Carefully follow instructions from your health care provider about fluid restrictions. Do not drink alcohol. Keep all follow-up visits as told by your health care provider. This is important. Contact a health care provider if: You develop worsening nausea, fatigue, headache, confusion, or weakness. Your symptoms go away and then return. You have problems following the recommended diet. Get help right away if: You have a seizure. You pass out. You have ongoing diarrhea or vomiting. Summary Hyponatremia is when the amount of salt (sodium) in your blood is too low. When sodium levels are low, your cells absorb extra water, which causes them to swell. The swelling happens throughout the body, but it mostly affects the brain. Treatment for this condition depends on the cause. It may include IV fluids, medicines, and limiting your fluid intake. This information is not intended to replace advice given to you by your health care provider. Make sure you discuss any questions you have with your healthcare provider. Document Revised: 07/04/2018 Document Reviewed: 07/04/2018 Elsevier Patient Education  2022 [employer].

## 2021-04-05 NOTE — Progress Notes (Signed)
Subjective:  Patient ID: [name], male    DOB: 08-Apr-1956  Age: 65 y.o. MRN: [id number]  CC: Hypertension and Anemia  This visit occurred during the SARS-CoV-2 public health emergency.  Safety protocols were in place, including screening questions prior to the visit, additional usage of staff PPE, and extensive cleaning of exam room while observing appropriate contact time as indicated for disinfecting solutions.    HPI Deepak Bless presents for f/up -  He is active and denies any recent episodes of headache, blurred vision, dizziness, lightheadedness, chest pain, shortness of breath, or edema.  Outpatient Medications Prior to Visit  Medication Sig Dispense Refill   alfuzosin (UROXATRAL) 10 MG 24 hr tablet Take 1 tablet (10 mg total) by mouth daily with breakfast. 90 tablet 1   amoxicillin (AMOXIL) 500 MG capsule Take 500 mg by mouth 2 (two) times daily.     DULoxetine (CYMBALTA) 60 MG capsule Take 60 mg by mouth daily.     dutasteride (AVODART) 0.5 MG capsule Take 1 capsule (0.5 mg total) by mouth daily. 90 capsule 1   Fish Oil-Cholecalciferol (FISH OIL + D3 PO) Take 1 capsule by mouth daily.     fluorouracil (EFUDEX) 5 % cream Apply topically as needed.     lamoTRIgine (LAMICTAL) 150 MG tablet Take 150 mg by mouth 2 (two) times daily.  1   mesalamine (LIALDA) 1.2 g EC tablet Take 1.2 g by mouth daily.     Multiple Vitamin (MULTIVITAMIN) tablet Take 1 tablet by mouth daily.     olmesartan (BENICAR) 40 MG tablet Take 1 tablet (40 mg total) by mouth daily. 90 tablet 1   QUEtiapine (SEROQUEL) 200 MG tablet Take 200 mg by mouth at bedtime.     torsemide (DEMADEX) 20 MG tablet Take 1 tablet (20 mg total) by mouth daily. 90 tablet 0   atorvastatin (LIPITOR) 80 MG tablet Take 1 tablet (80 mg total) by mouth daily. 90 tablet 1   No facility-administered medications prior to visit.    ROS Review of Systems  Constitutional:  Negative for chills, fatigue and fever.  HENT: Negative.     Respiratory:  Negative for cough, chest tightness, shortness of breath and wheezing.   Cardiovascular:  Negative for chest pain, palpitations and leg swelling.  Gastrointestinal:  Negative for abdominal pain, constipation, diarrhea, nausea and vomiting.  Endocrine: Negative for polyuria.  Genitourinary: Negative.  Negative for difficulty urinating, frequency and hematuria.  Musculoskeletal:  Negative for arthralgias, joint swelling and myalgias.  Skin: Negative.  Negative for color change and pallor.  Neurological: Negative.  Negative for dizziness, weakness, light-headedness and headaches.  Hematological:  Negative for adenopathy. Does not bruise/bleed easily.   Objective:  BP 128/78 (BP Location: Right Arm, Patient Position: Sitting, Cuff Size: Large)   Pulse 68   Temp 98 F (36.7 C) (Oral)   Ht 6' 2"  (1.88 m)   Wt 221 lb (100.2 kg)   SpO2 98%   BMI 28.37 kg/m   BP Readings from Last 3 Encounters:  04/05/21 128/78  03/21/21 132/74  02/21/21 (!) 160/100    Wt Readings from Last 3 Encounters:  04/05/21 221 lb (100.2 kg)  03/21/21 220 lb 3.2 oz (99.9 kg)  02/21/21 222 lb (100.7 kg)    Physical Exam Vitals reviewed.  HENT:     Nose: Nose normal.     Mouth/Throat:     Mouth: Mucous membranes are moist.  Eyes:     Conjunctiva/sclera: Conjunctivae normal.  Cardiovascular:  Rate and Rhythm: Normal rate and regular rhythm.     Heart sounds: No murmur heard. Pulmonary:     Effort: Pulmonary effort is normal.     Breath sounds: No stridor. No wheezing, rhonchi or rales.  Abdominal:     General: Abdomen is flat. Bowel sounds are normal. There is no distension.     Palpations: Abdomen is soft. There is no hepatomegaly, splenomegaly or mass.  Musculoskeletal:        General: Normal range of motion.     Cervical back: Neck supple.  Lymphadenopathy:     Cervical: No cervical adenopathy.  Skin:    General: Skin is warm and dry.  Neurological:     General: No focal  deficit present.     Mental Status: He is alert.  Psychiatric:        Mood and Affect: Mood normal.        Behavior: Behavior normal.    Lab Results  Component Value Date   WBC 5.3 04/05/2021   HGB 12.8 (L) 04/05/2021   HCT 37.3 (L) 04/05/2021   PLT 224.0 04/05/2021   GLUCOSE 96 04/05/2021   CHOL 132 02/21/2021   TRIG 61.0 02/21/2021   HDL 53.20 02/21/2021   LDLDIRECT 102.0 09/09/2017   LDLCALC 67 02/21/2021   ALT 31 02/21/2021   AST 36 02/21/2021   NA 134 (L) 04/05/2021   K 4.5 04/05/2021   CL 98 04/05/2021   CREATININE 1.00 04/05/2021   BUN 20 04/05/2021   CO2 27 04/05/2021   TSH 0.79 02/03/2020   PSA 2.26 02/21/2021   INR 0.9 03/08/2008   HGBA1C 6.2 02/21/2021    DG Knee Complete 4 Views Right  Result Date: 02/07/2020 CLINICAL DATA:  Status post fall. EXAM: RIGHT KNEE - COMPLETE 4+ VIEW COMPARISON:  None. FINDINGS: No evidence of acute fracture or dislocation. No evidence of arthropathy or other focal bone abnormality. A large joint effusion is seen. There is moderate severity vascular calcification. IMPRESSION: Large joint effusion without evidence of acute fracture. Electronically Signed   By: Virgina Norfolk M.D.   On: 02/07/2020 23:59    Assessment & Plan:   Hodari was seen today for hypertension and anemia.  Diagnoses and all orders for this visit:  Chronic hyponatremia- His sodium level has improved up to 134.  His urine sodium is normal.  Will continue the current dose of torsemide. -     Cortisol; Future -     Sodium, urine, random; Future -     Basic metabolic panel; Future -     Basic metabolic panel -     Sodium, urine, random -     Cortisol  Essential hypertension- His blood pressure is adequately well controlled. -     Cortisol; Future -     Basic metabolic panel; Future -     CBC with Differential/Platelet; Future -     CBC with Differential/Platelet -     Basic metabolic panel -     Cortisol  Deficiency anemia- He is mildly anemic but his  vitamin levels are normal.  I will continue to follow this. -     CBC with Differential/Platelet; Future -     Vitamin B12; Future -     Iron; Future -     Folate; Future -     Ferritin; Future -     Vitamin B1; Future -     Vitamin B1 -     Ferritin -  Folate -     Iron -     Vitamin B12 -     CBC with Differential/Platelet  I am having [name] maintain his DULoxetine, lamoTRIgine, multivitamin, QUEtiapine, fluorouracil, olmesartan, alfuzosin, torsemide, dutasteride, amoxicillin, Fish Oil-Cholecalciferol (FISH OIL + D3 PO), and mesalamine.  No orders of the defined types were placed in this encounter.    Follow-up: Return in about 4 months (around 08/05/2021).  [name], MD

## 2021-04-07 ENCOUNTER — Other Ambulatory Visit: Payer: Self-pay | Admitting: Internal Medicine

## 2021-04-07 DIAGNOSIS — E785 Hyperlipidemia, unspecified: Secondary | ICD-10-CM

## 2021-04-08 ENCOUNTER — Other Ambulatory Visit: Payer: Self-pay | Admitting: Internal Medicine

## 2021-04-08 ENCOUNTER — Encounter: Payer: Self-pay | Admitting: Internal Medicine

## 2021-04-08 DIAGNOSIS — E785 Hyperlipidemia, unspecified: Secondary | ICD-10-CM

## 2021-04-09 LAB — VITAMIN B1: Vitamin B1 (Thiamine): 17 nmol/L (ref 8–30)

## 2021-04-09 LAB — SODIUM, URINE, RANDOM: Sodium, Ur: 49 mmol/L (ref 28–272)

## 2021-04-10 ENCOUNTER — Other Ambulatory Visit: Payer: Self-pay | Admitting: Internal Medicine

## 2021-04-10 DIAGNOSIS — E785 Hyperlipidemia, unspecified: Secondary | ICD-10-CM

## 2021-04-10 MED ORDER — ATORVASTATIN CALCIUM 80 MG PO TABS: 80.0000 mg | ORAL_TABLET | Freq: Every day | ORAL | 1 refills | Status: DC

## 2021-04-12 NOTE — Telephone Encounter (Signed)
From patient.

## 2021-04-12 NOTE — Telephone Encounter (Signed)
Please direct him to [facility] to see if it can be done before the upcoming appt.   Otherwise, yes please push your appt back.

## 2021-04-13 ENCOUNTER — Other Ambulatory Visit: Payer: Medicare Other

## 2021-04-13 ENCOUNTER — Encounter: Payer: Self-pay | Admitting: Internal Medicine

## 2021-04-18 ENCOUNTER — Ambulatory Visit: Payer: Medicare Other | Admitting: Cardiology

## 2021-04-21 ENCOUNTER — Other Ambulatory Visit: Payer: Medicare Other

## 2021-04-25 ENCOUNTER — Other Ambulatory Visit: Payer: Self-pay | Admitting: Internal Medicine

## 2021-04-25 DIAGNOSIS — E871 Hypo-osmolality and hyponatremia: Secondary | ICD-10-CM

## 2021-04-25 DIAGNOSIS — I10 Essential (primary) hypertension: Secondary | ICD-10-CM

## 2021-05-02 ENCOUNTER — Other Ambulatory Visit: Payer: Medicare Other

## 2021-05-06 ENCOUNTER — Other Ambulatory Visit: Payer: Self-pay | Admitting: Internal Medicine

## 2021-05-06 DIAGNOSIS — N401 Enlarged prostate with lower urinary tract symptoms: Secondary | ICD-10-CM

## 2021-05-06 DIAGNOSIS — R3911 Hesitancy of micturition: Secondary | ICD-10-CM

## 2021-05-15 ENCOUNTER — Other Ambulatory Visit: Payer: Self-pay

## 2021-05-15 NOTE — Telephone Encounter (Signed)
I have received a fax from [employer] in [place], [place] phone # [phone] and fax # [phone] for patient's mesalamine. I have left Math a message to confirm how he is taking it- one or two daily.   He also needs to go ahead and set up his pre-visit and colon appointments for November. He has a recall in the system.

## 2021-05-16 ENCOUNTER — Telehealth: Payer: Self-pay | Admitting: Internal Medicine

## 2021-05-16 NOTE — Telephone Encounter (Signed)
Still unable to reach [facility]. I left him a message for him to try and call me tomorrow afternoon.

## 2021-05-16 NOTE — Telephone Encounter (Signed)
Pt called stating that he was returning your call from yesterday. Pls call him again.

## 2021-05-16 NOTE — Telephone Encounter (Signed)
I have left him 2 messages today and I will try again later today to reach out to him.

## 2021-05-17 NOTE — Telephone Encounter (Signed)
I left him another message and I will try again this afternoon to reach him.

## 2021-05-18 ENCOUNTER — Encounter: Payer: Self-pay | Admitting: Internal Medicine

## 2021-05-18 ENCOUNTER — Ambulatory Visit: Payer: Medicare Other | Admitting: Cardiology

## 2021-05-18 MED ORDER — MESALAMINE 1.2 G PO TBEC: 2.4000 g | DELAYED_RELEASE_TABLET | Freq: Every day | ORAL | 3 refills | Status: DC

## 2021-05-18 NOTE — Telephone Encounter (Signed)
See 05/15/21 refill note for more information about this call.

## 2021-05-18 NOTE — Telephone Encounter (Signed)
[name] and I spoke and I confirmed he is taking 2qd of his mesalamine 1.2g pills. I told him that is what the refill was sent in for.   While on the phone we made his pre-visit and colon appointments. I will have the girls mail him out paperwork. I confirmed his address.

## 2021-05-18 NOTE — Telephone Encounter (Signed)
See 05/16/21 call for the rest of the information on this message.

## 2021-05-18 NOTE — Telephone Encounter (Signed)
Inbound call from pt returning a missed call. Thank you.

## 2021-05-19 DIAGNOSIS — M25562 Pain in left knee: Secondary | ICD-10-CM | POA: Diagnosis not present

## 2021-05-24 ENCOUNTER — Ambulatory Visit: Payer: Medicare Other | Admitting: Internal Medicine

## 2021-05-27 ENCOUNTER — Encounter: Payer: Self-pay | Admitting: Internal Medicine

## 2021-05-31 ENCOUNTER — Encounter: Payer: Self-pay | Admitting: Internal Medicine

## 2021-06-04 DIAGNOSIS — M25562 Pain in left knee: Secondary | ICD-10-CM | POA: Diagnosis not present

## 2021-06-07 ENCOUNTER — Other Ambulatory Visit: Payer: Self-pay | Admitting: Internal Medicine

## 2021-06-07 DIAGNOSIS — I10 Essential (primary) hypertension: Secondary | ICD-10-CM

## 2021-06-07 DIAGNOSIS — E871 Hypo-osmolality and hyponatremia: Secondary | ICD-10-CM

## 2021-06-14 ENCOUNTER — Ambulatory Visit
Admission: RE | Admit: 2021-06-14 | Discharge: 2021-06-14 | Disposition: A | Discharge status: Home / Self Care | Payer: No Typology Code available for payment source | Source: Ambulatory Visit | Attending: Cardiology | Admitting: Cardiology

## 2021-06-14 DIAGNOSIS — I10 Essential (primary) hypertension: Secondary | ICD-10-CM | POA: Diagnosis not present

## 2021-06-14 DIAGNOSIS — E785 Hyperlipidemia, unspecified: Secondary | ICD-10-CM | POA: Diagnosis not present

## 2021-06-15 ENCOUNTER — Encounter: Payer: Self-pay | Admitting: Internal Medicine

## 2021-06-19 DIAGNOSIS — S83242D Other tear of medial meniscus, current injury, left knee, subsequent encounter: Secondary | ICD-10-CM | POA: Diagnosis not present

## 2021-06-20 ENCOUNTER — Other Ambulatory Visit: Payer: Self-pay | Admitting: Cardiology

## 2021-06-20 DIAGNOSIS — R9431 Abnormal electrocardiogram [ECG] [EKG]: Secondary | ICD-10-CM

## 2021-06-20 DIAGNOSIS — I2510 Atherosclerotic heart disease of native coronary artery without angina pectoris: Secondary | ICD-10-CM

## 2021-06-20 NOTE — Progress Notes (Signed)
V

## 2021-06-27 ENCOUNTER — Telehealth: Payer: Self-pay

## 2021-06-27 NOTE — Telephone Encounter (Signed)
Morning Dr. Carlean Purl:  Pt is being seen by cardiologist for abn EKG and unifocal PVCs.  Echo is within normal range;   Calcium score was 437 in the 81st percentile with a note to schedule a exercise stress test on this before next visit with Cardio which will be on 11/1.  Procedure at [facility] is 11/10.  Please advise, thank you

## 2021-06-27 NOTE — Telephone Encounter (Signed)
It looks like the cardiology evaluation will be completed prior to the 11 3 [place] visit.  We should just keep waiting and seeing what develops.  If he needs further testing then we will need to postpone but I would not do so yet.

## 2021-07-04 ENCOUNTER — Encounter: Payer: Self-pay | Admitting: Cardiology

## 2021-07-04 ENCOUNTER — Other Ambulatory Visit: Payer: Self-pay

## 2021-07-04 ENCOUNTER — Ambulatory Visit: Payer: Medicare Other | Admitting: Cardiology

## 2021-07-04 VITALS — BP 125/73 | HR 77 | Resp 16 | Ht 74.0 in | Wt 220.0 lb

## 2021-07-04 DIAGNOSIS — E785 Hyperlipidemia, unspecified: Secondary | ICD-10-CM

## 2021-07-04 DIAGNOSIS — I10 Essential (primary) hypertension: Secondary | ICD-10-CM | POA: Diagnosis not present

## 2021-07-04 DIAGNOSIS — I491 Atrial premature depolarization: Secondary | ICD-10-CM | POA: Diagnosis not present

## 2021-07-04 DIAGNOSIS — I2584 Coronary atherosclerosis due to calcified coronary lesion: Secondary | ICD-10-CM | POA: Diagnosis not present

## 2021-07-04 DIAGNOSIS — I2510 Atherosclerotic heart disease of native coronary artery without angina pectoris: Secondary | ICD-10-CM | POA: Diagnosis not present

## 2021-07-04 MED ORDER — ASPIRIN EC 81 MG PO TBEC: 81.0000 mg | DELAYED_RELEASE_TABLET | Freq: Every day | ORAL | 11 refills | Status: AC

## 2021-07-04 NOTE — Progress Notes (Signed)
Date:  07/04/2021   ID:  [name], DOB [birth year], MRN [id number]  PCP:  Phillip Lima, MD  Cardiologist:  Phillip Kras, DO, [facility] (established care 03/21/2021)  Date: 07/04/21 Last Office Visit: 03/21/2021  Chief Complaint  Patient presents with   Results   Follow-up    HPI  [name] is a 65 y.o. male who presents to the office with a chief complaint of " review test result." Patient's past medical history and cardiovascular risk factors include: Severe coronary artery calcification, dyslipidemia, prediabetes, hypertension, advanced age.  He is referred to the office at the request of Phillip Lima, MD for evaluation of abnormal EKG.  During his prior annual visit he was noted to have an abnormal EKG and given his risk factors was referred to cardiology for further evaluation and management.  At the last office visit we discussed considering an echocardiogram and coronary calcium score for further risk stratification.  Echocardiogram results reviewed with him in great detail and noted below for further reference.  Coronary calcium score noted a total score of 437 AU.  Clinically patient remains stable from a cardiovascular standpoint.  He continues to go to the gym for approximately 1.5 hours where he enjoys swimming, treadmill, and some resistance training.  No change in overall physical endurance.  FUNCTIONAL STATUS: Goes to the gym for about 1.5 hours enjoys swimming, resistant training, and treadmill.    ALLERGIES: No Known Allergies  MEDICATION LIST PRIOR TO VISIT: Current Meds  Medication Sig   alfuzosin (UROXATRAL) 10 MG 24 hr tablet TAKE 1 TABLET BY MOUTH  DAILY WITH BREAKFAST   amoxicillin (AMOXIL) 500 MG capsule Take 500 mg by mouth 2 (two) times daily.   aspirin EC 81 MG tablet Take 1 tablet (81 mg total) by mouth daily. Swallow whole.   atorvastatin (LIPITOR) 80 MG tablet Take 1 tablet (80 mg total) by mouth daily.   DULoxetine (CYMBALTA) 60 MG  capsule Take 60 mg by mouth daily.   dutasteride (AVODART) 0.5 MG capsule Take 1 capsule (0.5 mg total) by mouth daily.   Fish Oil-Cholecalciferol (FISH OIL + D3 PO) Take 1 capsule by mouth daily.   fluorouracil (EFUDEX) 5 % cream Apply topically as needed.   lamoTRIgine (LAMICTAL) 150 MG tablet Take 150 mg by mouth 2 (two) times daily.   mesalamine (LIALDA) 1.2 g EC tablet Take 2 tablets (2.4 g total) by mouth daily.   Multiple Vitamin (MULTIVITAMIN) tablet Take 1 tablet by mouth daily.   olmesartan (BENICAR) 40 MG tablet TAKE 1 TABLET BY MOUTH  DAILY   QUEtiapine (SEROQUEL) 200 MG tablet Take 200 mg by mouth at bedtime.   torsemide (DEMADEX) 20 MG tablet TAKE 1 TABLET BY MOUTH  DAILY     PAST MEDICAL HISTORY: Past Medical History:  Diagnosis Date   Alcoholism ([place])    10 yrs mostly sober in [place]    Bipolar 2 disorder ([place])    Cancer ([place])    skin, basal cell   Depression    Hemorrhoids 11-23-2010   Colonoscopy with [facility]   Hyperlipemia    Hypertension    Muscle spasm of back    Personal history of colonic polyps 11-23-2010   5 mm adenoma Colonoscopy with [facility]   Rosacea    Substance abuse ([place])    alcohol   Ulcerative colitis 11-23-2010   Colonoscopy with [facility]    PAST SURGICAL HISTORY: Past Surgical History:  Procedure Laterality Date  ACHILLES TENDON REPAIR  1995   BASAL CELL CARCINOMA EXCISION     COLONOSCOPY  2019   COLONOSCOPY W/ BIOPSIES  2003, 04/2008, 11/2010   2003: proctitis 2009: ulcerative colitis and hemorrhoids 2012: ulcerative colitis and 27m sigmoid  tubular adenoma   MOHS SURGERY  2006   QUADRICEPS TENDON REPAIR     SHOULDER SURGERY      FAMILY HISTORY: The patient family history is not on file. He was adopted.  SOCIAL HISTORY:  The patient  reports that he has never smoked. He has never used smokeless tobacco. He reports that he does not currently use alcohol after a past usage of about 14.0 standard drinks per week. He reports that he does  not use drugs.  REVIEW OF SYSTEMS: Review of Systems  Constitutional: Negative for chills and fever.  HENT:  Negative for hoarse voice and nosebleeds.   Eyes:  Negative for discharge, double vision and pain.  Cardiovascular:  Negative for chest pain, claudication, dyspnea on exertion, leg swelling, near-syncope, orthopnea, palpitations, paroxysmal nocturnal dyspnea and syncope.  Respiratory:  Negative for hemoptysis and shortness of breath.   Musculoskeletal:  Negative for muscle cramps and myalgias.  Gastrointestinal:  Negative for abdominal pain, constipation, diarrhea, hematemesis, hematochezia, melena, nausea and vomiting.  Neurological:  Negative for dizziness and light-headedness.  All other systems reviewed and are negative.  PHYSICAL EXAM: Vitals with BMI 07/04/2021 04/05/2021 03/21/2021  Height 6' 2"  6' 2"  6' 2"   Weight 220 lbs 221 lbs 220 lbs 3 oz  BMI 28.23 202.58252.77 Systolic [id number] Diastolic 73 78 74  Pulse 77 68 73    CONSTITUTIONAL: Well-developed and well-nourished. No acute distress.  SKIN: Skin is warm and dry. No rash noted. No cyanosis. No pallor. No jaundice HEAD: Normocephalic and atraumatic.  EYES: No scleral icterus MOUTH/THROAT: Moist oral membranes.  NECK: No JVD present. No thyromegaly noted. No carotid bruits  LYMPHATIC: No visible cervical adenopathy.  CHEST Normal respiratory effort. No intercostal retractions  LUNGS: Clear to auscultation bilaterally.  No stridor. No wheezes. No rales.  CARDIOVASCULAR: Regular, positive S1-S2, no murmurs rubs or gallops appreciated. ABDOMINAL: Nonobese, soft, nontender, nondistended, positive bowel sounds in all 4 quadrants no apparent ascites.  EXTREMITIES: No peripheral edema, 2+ dorsalis pedis and posterior tibial pulses.  Poor nail hygiene. HEMATOLOGIC: No significant bruising NEUROLOGIC: Oriented to person, place, and time. Nonfocal. Normal muscle tone.  PSYCHIATRIC: Normal mood and affect. Normal  behavior. Cooperative  CARDIAC DATABASE: EKG: 02/21/2021: Normal sinus rhythm, 61 bpm, left axis deviation, poor R wave progression, occasional PACs, without underlying ischemia or injury pattern.  03/21/2021: Normal sinus rhythm, 64 bpm, left axis deviation, left anterior fascicular block, poor R wave progression, without underlying ischemia or injury pattern.  Echocardiogram: 03/23/2021: Normal LV systolic function with visual EF 60-65%. Left ventricle cavity is normal in size. Mild left ventricular hypertrophy. Normal global wall motion. Normal diastolic filling pattern, normal LAP. Trace aortic regurgitation. Mild tricuspid regurgitation. No evidence of pulmonary hypertension. No prior study for comparison.   Stress Testing: No results found for this or any previous visit from the past 1095 days.  Heart Catheterization: None  CAC scoring 06/14/2021:  Coronary calcium score of 437 is at the 81st percentile for the patient's age, sex and race.  LABORATORY DATA: CBC Latest Ref Rng & Units 04/05/2021 02/21/2021 05/31/2020  WBC 4.0 - 10.5 K/uL 5.3 7.8 6.7  Hemoglobin 13.0 - 17.0 g/dL 12.8(L) 13.2 13.8  Hematocrit 39.0 -  52.0 % 37.3(L) 37.8(L) 40.6  Platelets 150.0 - 400.0 K/uL 224.0 238.0 265    CMP Latest Ref Rng & Units 04/05/2021 02/21/2021 05/31/2020  Glucose 70 - 99 mg/dL 96 89 98  BUN 6 - 23 mg/dL 20 19 13   Creatinine 0.40 - 1.50 mg/dL 1.00 0.91 0.82  Sodium 135 - 145 mEq/L 134(L) 130(L) 135  Potassium 3.5 - 5.1 mEq/L 4.5 4.2 4.5  Chloride 96 - 112 mEq/L 98 96 102  CO2 19 - 32 mEq/L 27 25 25   Calcium 8.4 - 10.5 mg/dL 9.4 9.5 9.6  Total Protein 6.0 - 8.3 g/dL - 6.8 -  Total Bilirubin 0.2 - 1.2 mg/dL - 1.0 -  Alkaline Phos 39 - 117 U/L - 62 -  AST 0 - 37 U/L - 36 -  ALT 0 - 53 U/L - 31 -    Lipid Panel  .  No components found for: NTPROBNP No results for input(s): PROBNP in the last 8760 hours. No results for input(s): TSH in the last 8760 hours.  BMP Recent Labs     02/21/21 1141 04/05/21 0855  NA 130* 134*  K 4.2 4.5  CL 96 98  CO2 25 27  GLUCOSE 89 96  BUN 19 20  CREATININE 0.91 1.00  CALCIUM 9.5 9.4    HEMOGLOBIN A1C Lab Results  Component Value Date   HGBA1C 6.2 02/21/2021    IMPRESSION:    ICD-10-CM   1. Coronary atherosclerosis due to calcified coronary lesion  I25.10 aspirin EC 81 MG tablet   I25.84     2. PAC (premature atrial contraction)  I49.1     3. Benign hypertension  I10     4. Dyslipidemia, goal LDL below 100  E78.5        RECOMMENDATIONS: [name] is a 65 y.o. male whose past medical history and cardiac risk factors include: Severe coronary artery calcification, dyslipidemia, prediabetes, hypertension, advanced age.  Coronary atherosclerosis due to calcified coronary lesion Total CAC 437 AU, 81st percentile Continue statin therapy Start aspirin 81 mg p.o. daily Educated on the importance of secondary prevention by improving his modifiable cardiovascular risk factors. Patient has good functional capacity for age. We will proceed with exercise nuclear stress test given his coronary artery calcification and estimated 10-year risk of ASCVD.  PAC (premature atrial contraction) Asymptomatic Monitor for now  Benign hypertension Office blood pressures are very well controlled. Medications reconciled. Currently managed by primary team.  Dyslipidemia, goal LDL below 100 Currently on statin therapy. Patient's LDL was less than 70 mg/dL. I have asked him to discuss possibly reducing the dose of Lipitor to 40 mg p.o. nightly with his PCP and reevaluate. Monitor for now  FINAL MEDICATION LIST END OF ENCOUNTER: Meds ordered this encounter  Medications   aspirin EC 81 MG tablet    Sig: Take 1 tablet (81 mg total) by mouth daily. Swallow whole.    Dispense:  30 tablet    Refill:  11     There are no discontinued medications.    Current Outpatient Medications:    alfuzosin (UROXATRAL) 10 MG 24 hr  tablet, TAKE 1 TABLET BY MOUTH  DAILY WITH BREAKFAST, Disp: 90 tablet, Rfl: 1   amoxicillin (AMOXIL) 500 MG capsule, Take 500 mg by mouth 2 (two) times daily., Disp: , Rfl:    aspirin EC 81 MG tablet, Take 1 tablet (81 mg total) by mouth daily. Swallow whole., Disp: 30 tablet, Rfl: 11   atorvastatin (LIPITOR) 80 MG  tablet, Take 1 tablet (80 mg total) by mouth daily., Disp: 90 tablet, Rfl: 1   DULoxetine (CYMBALTA) 60 MG capsule, Take 60 mg by mouth daily., Disp: , Rfl:    dutasteride (AVODART) 0.5 MG capsule, Take 1 capsule (0.5 mg total) by mouth daily., Disp: 90 capsule, Rfl: 1   Fish Oil-Cholecalciferol (FISH OIL + D3 PO), Take 1 capsule by mouth daily., Disp: , Rfl:    fluorouracil (EFUDEX) 5 % cream, Apply topically as needed., Disp: , Rfl:    lamoTRIgine (LAMICTAL) 150 MG tablet, Take 150 mg by mouth 2 (two) times daily., Disp: , Rfl: 1   mesalamine (LIALDA) 1.2 g EC tablet, Take 2 tablets (2.4 g total) by mouth daily., Disp: 150 tablet, Rfl: 3   Multiple Vitamin (MULTIVITAMIN) tablet, Take 1 tablet by mouth daily., Disp: , Rfl:    olmesartan (BENICAR) 40 MG tablet, TAKE 1 TABLET BY MOUTH  DAILY, Disp: 90 tablet, Rfl: 0   QUEtiapine (SEROQUEL) 200 MG tablet, Take 200 mg by mouth at bedtime., Disp: , Rfl:    torsemide (DEMADEX) 20 MG tablet, TAKE 1 TABLET BY MOUTH  DAILY, Disp: 90 tablet, Rfl: 0  No orders of the defined types were placed in this encounter.   There are no Patient Instructions on file for this visit.   --Continue cardiac medications as reconciled in final medication list. --Return in about 1 year (around 07/04/2022) for Follow up, Coronary artery calcification, Review test results. Or sooner if needed. --Continue follow-up with your primary care physician regarding the management of your other chronic comorbid conditions.  Patient's questions and concerns were addressed to his satisfaction. He voices understanding of the instructions provided during this encounter.    This note was created using a voice recognition software as a result there may be grammatical errors inadvertently enclosed that do not reflect the nature of this encounter. Every attempt is made to correct such errors.  [name], [place], [facility]  Pager: [phone] Office: [phone]

## 2021-07-05 NOTE — Telephone Encounter (Signed)
From patient.

## 2021-07-06 ENCOUNTER — Other Ambulatory Visit: Payer: Self-pay

## 2021-07-06 ENCOUNTER — Encounter: Payer: Self-pay | Admitting: Internal Medicine

## 2021-07-06 ENCOUNTER — Ambulatory Visit (AMBULATORY_SURGERY_CENTER): Payer: Self-pay

## 2021-07-06 VITALS — Ht 74.0 in | Wt 225.0 lb

## 2021-07-06 DIAGNOSIS — K51019 Ulcerative (chronic) pancolitis with unspecified complications: Secondary | ICD-10-CM

## 2021-07-06 DIAGNOSIS — Z86010 Personal history of colonic polyps: Secondary | ICD-10-CM

## 2021-07-06 NOTE — Progress Notes (Addendum)
No egg or soy allergy known to patient  No issues known to pt with past sedation with any surgeries or procedures Patient denies ever being told they had issues or difficulty with intubation  No FH of Malignant Hyperthermia Pt is not on diet pills Pt is not on home 02  Pt is not on blood thinners  Pt denies issues with constipation at this time;  No A fib or A flutter Pt is fully vaccinated for Covid x 2 + booster;  Due to the COVID-19 pandemic we are asking patients to follow certain guidelines in [place] and the [place]   Pt aware of COVID protocols and [place] guidelines   Patient reports he already has the Suprep kit at home from last year; RX not sent in today; patient advised that if the prep kit that is at his home does not match his instructions -he will need to call back to the office and inform RN of kit at home and that instructions made need to be changed if this is the case

## 2021-07-07 NOTE — Telephone Encounter (Signed)
We can rediscuss this at the next office visit.  The calcium score only accounts for calcified plaque if that helps.  Dr. Terri Skains

## 2021-07-09 ENCOUNTER — Encounter: Payer: Self-pay | Admitting: Internal Medicine

## 2021-07-10 NOTE — Telephone Encounter (Signed)
From patient.

## 2021-07-10 NOTE — Telephone Encounter (Signed)
Colonoscopy is low risk procedure.  He has no CP, good functional capacity, and EF is preserved. He can proceed as scheduled. If his GI provider would like to hold off until the stress test is done that is acceptable as well.   Dr. Terri Skains

## 2021-07-10 NOTE — Telephone Encounter (Signed)
Left message for pt to call back  °

## 2021-07-10 NOTE — Telephone Encounter (Signed)
The answers are not very simple and better if we have a discussion. We can discuss theses topic at the next visit. He can schedule a follow after his stress test.

## 2021-07-11 NOTE — Telephone Encounter (Signed)
I spoke to patient he agreed with coming into the office after stress test when he gets his test done we will follow up then

## 2021-07-11 NOTE — Telephone Encounter (Signed)
Pt was notified that his Colonoscopy was cancelled until after the Stress Test and Results made available. Pt verbalized understanding with all questions answered. Pt states that his Stress Test is 07/17/2021

## 2021-07-13 ENCOUNTER — Encounter: Payer: Medicare Other | Admitting: Internal Medicine

## 2021-07-13 ENCOUNTER — Encounter: Payer: Self-pay | Admitting: Internal Medicine

## 2021-07-14 NOTE — Telephone Encounter (Signed)
From patient.

## 2021-07-14 NOTE — Telephone Encounter (Signed)
That's fine. Please have front desk reschedule the appointment if needed.

## 2021-07-14 NOTE — Telephone Encounter (Signed)
See message above

## 2021-07-17 ENCOUNTER — Ambulatory Visit: Payer: Medicare Other | Admitting: Internal Medicine

## 2021-07-17 ENCOUNTER — Other Ambulatory Visit: Payer: Medicare Other

## 2021-07-17 NOTE — Telephone Encounter (Signed)
From pt

## 2021-07-18 NOTE — Telephone Encounter (Signed)
See discussion from 07/09/21. Colonoscopy has been cancelled.

## 2021-08-08 ENCOUNTER — Other Ambulatory Visit: Payer: Medicare Other

## 2021-08-14 ENCOUNTER — Encounter: Payer: Self-pay | Admitting: Cardiology

## 2021-08-14 ENCOUNTER — Other Ambulatory Visit: Payer: Medicare Other

## 2021-08-14 NOTE — Telephone Encounter (Signed)
From patient.

## 2021-08-15 ENCOUNTER — Encounter: Payer: Self-pay | Admitting: Cardiology

## 2021-08-15 NOTE — Telephone Encounter (Signed)
See you can get hold off him.  I tried calling him but left a VM today.  See if there is an alternative number for him.  ST

## 2021-08-15 NOTE — Telephone Encounter (Signed)
Mr. Sheahan,  First of all I apologies for the inconvenience. I remember responding back to your messages.   They may have not reached you is another concern. I will have that looked into. I will have my office manager reach out to you as well.   This is not the intention of our practice to have your questions unaddressed.   I just called you as well (personally) and left you a voicemail.   Dr. Terri Skains

## 2021-08-15 NOTE — Telephone Encounter (Signed)
I reviewed notes with Amber.  Called patient and left a message.  I also emailed him information on Nuclear Medicine procedure.  I did speak with pt on Sunday to reschedule appt to 12/14.  I did tell him I would send him information or would call him on Monday or Tuesday regarding specifics of the procedure.

## 2021-08-15 NOTE — Telephone Encounter (Signed)
From patient.

## 2021-08-15 NOTE — Telephone Encounter (Signed)
[name] is handling this. She will try to call pt, tried once today.

## 2021-08-16 ENCOUNTER — Other Ambulatory Visit: Payer: Medicare Other

## 2021-08-17 NOTE — Telephone Encounter (Signed)
LVM asking patient to call me in reference to Nuclear Medicine procedure. Left office number on his answering machine.

## 2021-08-18 NOTE — Telephone Encounter (Signed)
Email received from pt thanking me for Nuclear Medicine information sent to him via email.

## 2021-08-21 NOTE — Telephone Encounter (Signed)
Received email from pt stating he would call office to make nuclear medicine appt.  I let him know that his message to Dr. Terri Skains was read by Dr. Terri Skains. I let him know Dr. Terri Skains was happy he was feeling better.

## 2021-08-30 DIAGNOSIS — Z23 Encounter for immunization: Secondary | ICD-10-CM | POA: Diagnosis not present

## 2021-08-31 ENCOUNTER — Encounter: Payer: Self-pay | Admitting: Internal Medicine

## 2021-09-01 DIAGNOSIS — R42 Dizziness and giddiness: Secondary | ICD-10-CM | POA: Diagnosis not present

## 2021-09-01 DIAGNOSIS — I491 Atrial premature depolarization: Secondary | ICD-10-CM | POA: Diagnosis not present

## 2021-09-01 NOTE — Telephone Encounter (Signed)
Left voicemail for patient to call the office to discuss further. PCP has been managing blood pressure and diuretics.

## 2021-09-01 NOTE — Telephone Encounter (Signed)
From patient.

## 2021-09-01 NOTE — Telephone Encounter (Signed)
Spoke to patient, advised that he hold torsemide and olmesartan for now. He will continue to monitor blood pressure at home and notify our office or PCP if blood pressure remains soft or is >140/90 mmHg.

## 2021-09-02 NOTE — Telephone Encounter (Signed)
Agree, Celeste. If he calls back and his SBP is greater than 162mHg would restart Olmesartan first.

## 2021-09-03 ENCOUNTER — Other Ambulatory Visit: Payer: Self-pay | Admitting: Internal Medicine

## 2021-09-03 DIAGNOSIS — E785 Hyperlipidemia, unspecified: Secondary | ICD-10-CM

## 2021-09-03 DIAGNOSIS — I10 Essential (primary) hypertension: Secondary | ICD-10-CM

## 2021-09-03 DIAGNOSIS — E871 Hypo-osmolality and hyponatremia: Secondary | ICD-10-CM

## 2021-09-03 HISTORY — PX: KNEE SURGERY: SHX244

## 2021-09-07 ENCOUNTER — Other Ambulatory Visit: Payer: Self-pay

## 2021-09-07 ENCOUNTER — Encounter: Payer: Self-pay | Admitting: Internal Medicine

## 2021-09-07 ENCOUNTER — Ambulatory Visit (INDEPENDENT_AMBULATORY_CARE_PROVIDER_SITE_OTHER): Payer: Medicare Other | Admitting: Internal Medicine

## 2021-09-07 DIAGNOSIS — I10 Essential (primary) hypertension: Secondary | ICD-10-CM | POA: Diagnosis not present

## 2021-09-07 NOTE — Progress Notes (Signed)
° °  Subjective:   Patient ID: [name], male    DOB: [birth year], 66 y.o.   MRN: [id number]  HPI The patient is a 66 YO man coming in for low BP at home. BP down to 70/50 several days with dizziness. He stopped both BP medications at direction of urgent care/cardiology. He was taking torsemide and olmesartan. For last 5 days not taking any and is not having dizziness any longer.   Review of Systems  Constitutional: Negative.   HENT: Negative.    Eyes: Negative.   Respiratory:  Negative for cough, chest tightness and shortness of breath.   Cardiovascular:  Negative for chest pain, palpitations and leg swelling.  Gastrointestinal:  Negative for abdominal distention, abdominal pain, constipation, diarrhea, nausea and vomiting.  Musculoskeletal: Negative.   Skin: Negative.   Neurological:  Positive for dizziness.  Psychiatric/Behavioral: Negative.     Objective:  Physical Exam Constitutional:      Appearance: He is well-developed.  HENT:     Head: Normocephalic and atraumatic.  Cardiovascular:     Rate and Rhythm: Normal rate and regular rhythm.  Pulmonary:     Effort: Pulmonary effort is normal. No respiratory distress.     Breath sounds: Normal breath sounds. No wheezing or rales.  Abdominal:     General: Bowel sounds are normal. There is no distension.     Palpations: Abdomen is soft.     Tenderness: There is no abdominal tenderness. There is no rebound.  Musculoskeletal:     Cervical back: Normal range of motion.  Skin:    General: Skin is warm and dry.  Neurological:     Mental Status: He is alert and oriented to person, place, and time.     Coordination: Coordination normal.    Vitals:   09/07/21 0945  BP: 120/70  Pulse: 70  Temp: 98.5 F (36.9 C)  TempSrc: Oral  SpO2: 97%  Weight: 223 lb (101.2 kg)  Height: 6' 2"  (1.88 m)    This visit occurred during the SARS-CoV-2 public health emergency.  Safety protocols were in place, including screening questions  prior to the visit, additional usage of staff PPE, and extensive cleaning of exam room while observing appropriate contact time as indicated for disinfecting solutions.   Assessment & Plan:

## 2021-09-07 NOTE — Patient Instructions (Addendum)
Monitor the blood pressure and if >140/90 let us know and we will restart the olmesartan.

## 2021-09-08 NOTE — Assessment & Plan Note (Signed)
No change to diet and no relapse of alcohol use. Unclear why BP did go low. He is off all medications at this time. Advised to stay off torsemide and olmesartan. Monitor BP and if >140/90 call and we will likely resume olmesartan.

## 2021-09-13 DIAGNOSIS — L570 Actinic keratosis: Secondary | ICD-10-CM | POA: Diagnosis not present

## 2021-09-13 DIAGNOSIS — L821 Other seborrheic keratosis: Secondary | ICD-10-CM | POA: Diagnosis not present

## 2021-09-13 DIAGNOSIS — Z85828 Personal history of other malignant neoplasm of skin: Secondary | ICD-10-CM | POA: Diagnosis not present

## 2021-09-13 DIAGNOSIS — L905 Scar conditions and fibrosis of skin: Secondary | ICD-10-CM | POA: Diagnosis not present

## 2021-09-13 DIAGNOSIS — L853 Xerosis cutis: Secondary | ICD-10-CM | POA: Diagnosis not present

## 2021-09-14 DIAGNOSIS — M25512 Pain in left shoulder: Secondary | ICD-10-CM | POA: Diagnosis not present

## 2021-09-24 ENCOUNTER — Encounter: Payer: Self-pay | Admitting: Cardiology

## 2021-09-25 NOTE — Telephone Encounter (Signed)
[place] called and spoke with pt today. Procedure has been rescheduled.

## 2021-09-26 NOTE — Telephone Encounter (Unsigned)
MyChart messages reviewed.  Called patient and left voicemail asking for patient to return my call.  Authorization for Nuclear Medicine on 09/27/21 is still pending.

## 2021-09-27 ENCOUNTER — Other Ambulatory Visit: Payer: Medicare Other

## 2021-10-02 ENCOUNTER — Ambulatory Visit: Payer: Medicare Other

## 2021-10-02 ENCOUNTER — Other Ambulatory Visit: Payer: Self-pay

## 2021-10-02 DIAGNOSIS — R9431 Abnormal electrocardiogram [ECG] [EKG]: Secondary | ICD-10-CM

## 2021-10-02 DIAGNOSIS — I2584 Coronary atherosclerosis due to calcified coronary lesion: Secondary | ICD-10-CM

## 2021-10-02 DIAGNOSIS — I2510 Atherosclerotic heart disease of native coronary artery without angina pectoris: Secondary | ICD-10-CM

## 2021-10-03 ENCOUNTER — Encounter: Payer: Self-pay | Admitting: Cardiology

## 2021-10-03 NOTE — Telephone Encounter (Signed)
Can you look into this please and thank you?

## 2021-10-05 ENCOUNTER — Encounter: Payer: Self-pay | Admitting: Cardiology

## 2021-10-05 ENCOUNTER — Encounter: Payer: Self-pay | Admitting: Internal Medicine

## 2021-10-05 NOTE — Telephone Encounter (Signed)
From pt

## 2021-10-10 ENCOUNTER — Encounter: Payer: Self-pay | Admitting: Cardiology

## 2021-10-10 ENCOUNTER — Encounter: Payer: Self-pay | Admitting: Internal Medicine

## 2021-10-10 NOTE — Telephone Encounter (Signed)
Called patient left message regarding patient appt with Dr. Ronnald Ramp on 11/28/21. Per Dr. Ronnald Ramp that appt time and date was ok for a f/u. Thank you

## 2021-10-10 NOTE — Telephone Encounter (Signed)
From patient.

## 2021-10-10 NOTE — Telephone Encounter (Signed)
Called patient and left voicemail to schedule a f/u appt at Dr. Ronnald Ramp earliest appt.

## 2021-10-11 ENCOUNTER — Ambulatory Visit: Payer: Medicare Other | Admitting: Cardiology

## 2021-10-11 ENCOUNTER — Other Ambulatory Visit: Payer: Self-pay

## 2021-10-11 ENCOUNTER — Encounter: Payer: Self-pay | Admitting: Cardiology

## 2021-10-11 VITALS — BP 100/63 | HR 96 | Temp 98.7°F | Resp 14 | Ht 74.0 in | Wt 224.4 lb

## 2021-10-11 DIAGNOSIS — I10 Essential (primary) hypertension: Secondary | ICD-10-CM | POA: Diagnosis not present

## 2021-10-11 DIAGNOSIS — I2510 Atherosclerotic heart disease of native coronary artery without angina pectoris: Secondary | ICD-10-CM

## 2021-10-11 DIAGNOSIS — E785 Hyperlipidemia, unspecified: Secondary | ICD-10-CM | POA: Diagnosis not present

## 2021-10-11 DIAGNOSIS — R42 Dizziness and giddiness: Secondary | ICD-10-CM | POA: Diagnosis not present

## 2021-10-11 DIAGNOSIS — I2584 Coronary atherosclerosis due to calcified coronary lesion: Secondary | ICD-10-CM | POA: Diagnosis not present

## 2021-10-11 NOTE — Progress Notes (Signed)
Date:  10/11/2021   ID:  [name], DOB [birth year], MRN [id number]  PCP:  Janith Lima, MD  Cardiologist:  Rex Kras, DO, [facility] (established care 03/21/2021)  Date: 10/11/21 Last Office Visit: 07/04/2021  Chief Complaint  Patient presents with   Coronary artery calcification   Results   Follow-up   Dizziness    HPI  [name] is a 66 y.o. male who presents to the office with a chief complaint of " known history of coronary calcification and reviewed stress test results and dizziness." Patient's past medical history and cardiovascular risk factors include: Severe coronary artery calcification, dyslipidemia, prediabetes, hypertension, advanced age.  He is referred to the office at the request of Janith Lima, MD for evaluation of abnormal EKG.  In the past patient did undergo a coronary artery calcium score which noted a total CAC of approximately 437AU and given his other cardiovascular risk factors and advanced age recommended exercise nuclear stress test to evaluate for functional status and reversible ischemia.  Patient is tolerating aspirin and cholesterol medications well without any side effects or intolerances.  His overall functional status remains relatively stable.  Patient states that he enjoys swimming, treadmill, resistance training.  Results of the nuclear stress test reviewed with him in great detail with illustrations as well as reviewing the SPECT images at today's office visit.  He is also complaining of dizziness predominately with changing positions.  He is currently on both olmesartan and torsemide for blood pressure management as prescribed by his primary care provider.  He denies any near-syncope or syncopal event.  FUNCTIONAL STATUS: Goes to the gym for about 1.5 hours enjoys swimming, resistant training, and treadmill.    ALLERGIES: No Known Allergies  MEDICATION LIST PRIOR TO VISIT: Current Meds  Medication Sig   alfuzosin (UROXATRAL) 10  MG 24 hr tablet TAKE 1 TABLET BY MOUTH  DAILY WITH BREAKFAST   amoxicillin (AMOXIL) 500 MG capsule Take 500 mg by mouth 2 (two) times daily.   aspirin EC 81 MG tablet Take 1 tablet (81 mg total) by mouth daily. Swallow whole.   atorvastatin (LIPITOR) 80 MG tablet TAKE 1 TABLET BY MOUTH  DAILY   DULoxetine (CYMBALTA) 60 MG capsule Take 60 mg by mouth daily.   dutasteride (AVODART) 0.5 MG capsule Take 1 capsule (0.5 mg total) by mouth daily.   Fish Oil-Cholecalciferol (FISH OIL + D3 PO) Take 1 capsule by mouth daily.   fluorouracil (EFUDEX) 5 % cream Apply topically as needed.   lamoTRIgine (LAMICTAL) 150 MG tablet Take 150 mg by mouth 2 (two) times daily.   mesalamine (LIALDA) 1.2 g EC tablet Take 2 tablets (2.4 g total) by mouth daily.   Multiple Vitamin (MULTIVITAMIN) tablet Take 1 tablet by mouth daily.   olmesartan (BENICAR) 40 MG tablet TAKE 1 TABLET BY MOUTH  DAILY   QUEtiapine (SEROQUEL) 200 MG tablet Take 200 mg by mouth at bedtime.   [DISCONTINUED] torsemide (DEMADEX) 20 MG tablet TAKE 1 TABLET BY MOUTH  DAILY     PAST MEDICAL HISTORY: Past Medical History:  Diagnosis Date   Alcoholism ([place])    10 yrs mostly sober in [place]    Bipolar 2 disorder ([place])    on meds   Cancer ([place])    skin, basal cell   Depression    on meds   Hemorrhoids 11/23/2010   Colonoscopy with [facility]   Hyperlipemia    on meds   Hypertension    on meds  Muscle spasm of back    Personal history of colonic polyps 11/23/2010   5 mm adenoma Colonoscopy with [facility]   Rosacea    Substance abuse ([place])    recovering alcoholic- "in remission"   Ulcerative colitis 11/23/2010   Colonoscopy with [facility]    PAST SURGICAL HISTORY: Past Surgical History:  Procedure Laterality Date   ACHILLES TENDON REPAIR Right 1995   BASAL CELL CARCINOMA EXCISION     COLONOSCOPY  2019   CG-MAC-prep good ([place])-benign colonic mucosas/recall 3 yr   COLONOSCOPY W/ BIOPSIES  2003, 04/2008, 11/2010   2003: proctitis 2009:  ulcerative colitis and hemorrhoids 2012: ulcerative colitis and 64m sigmoid  tubular adenoma   MOHS SURGERY  2006   QUADRICEPS TENDON REPAIR Right 2021   SHOULDER SURGERY Right 2001   TONSILLECTOMY      FAMILY HISTORY: The patient family history is not on file. He was adopted.  SOCIAL HISTORY:  The patient  reports that he has never smoked. He has never used smokeless tobacco. He reports that he does not currently use alcohol. He reports that he does not use drugs.  REVIEW OF SYSTEMS: Review of Systems  Constitutional: Negative for chills and fever.  HENT:  Negative for hoarse voice and nosebleeds.   Eyes:  Negative for discharge, double vision and pain.  Cardiovascular:  Negative for chest pain, claudication, dyspnea on exertion, leg swelling, near-syncope, orthopnea, palpitations, paroxysmal nocturnal dyspnea and syncope.  Respiratory:  Negative for hemoptysis and shortness of breath.   Musculoskeletal:  Negative for muscle cramps and myalgias.  Gastrointestinal:  Negative for abdominal pain, constipation, diarrhea, hematemesis, hematochezia, melena, nausea and vomiting.  Neurological:  Negative for dizziness and light-headedness.  All other systems reviewed and are negative.  PHYSICAL EXAM: Vitals with BMI 10/11/2021 09/07/2021 07/06/2021  Height _0  _1  _2   Weight 224 lbs 6 oz 223 lbs 225 lbs  BMI 28.8 [phone] Systolic [id number]  Diastolic 63 70 -  Pulse 96 70 -   Orthostatic VS for the past 72 hrs (Last 3 readings):  Orthostatic BP Patient Position BP Location Cuff Size Orthostatic Pulse  10/11/21 1005 (!) 87/59 Standing Left Arm Large 96  10/11/21 1004 108/70 Sitting Left Arm Large 96  10/11/21 1003 99/63 Supine Left Arm Large 92   CONSTITUTIONAL: Well-developed and well-nourished. No acute distress.  SKIN: Skin is warm and dry. No rash noted. No cyanosis. No pallor. No jaundice HEAD: Normocephalic and atraumatic.  EYES: No scleral icterus MOUTH/THROAT: Moist  oral membranes.  NECK: No JVD present. No thyromegaly noted. No carotid bruits  LYMPHATIC: No visible cervical adenopathy.  CHEST Normal respiratory effort. No intercostal retractions  LUNGS: Clear to auscultation bilaterally.  No stridor. No wheezes. No rales.  CARDIOVASCULAR: Regular, positive S1-S2, no murmurs rubs or gallops appreciated. ABDOMINAL: Nonobese, soft, nontender, nondistended, positive bowel sounds in all 4 quadrants no apparent ascites.  EXTREMITIES: No peripheral edema, 2+ dorsalis pedis and posterior tibial pulses.  Poor nail hygiene. HEMATOLOGIC: No significant bruising NEUROLOGIC: Oriented to person, place, and time. Nonfocal. Normal muscle tone.  PSYCHIATRIC: Normal mood and affect. Normal behavior. Cooperative. No significant change in physical examination since last visit  CARDIAC DATABASE: EKG: 10/11/2021: NSR, 86bpm, LAD, LAFB, IVCD.  Echocardiogram: 03/23/2021: Normal LV systolic function with visual EF 60-65%. Left ventricle cavity is normal in size. Mild left ventricular hypertrophy. Normal global wall motion. Normal diastolic filling pattern, normal LAP. Trace aortic regurgitation. Mild tricuspid regurgitation. No  evidence of pulmonary hypertension. No prior study for comparison.   Stress Testing: Exercise nuclear stress test 10/02/2021: Normal ECG stress. The patient exercised for 3 minutes and 0 seconds of a Bruce protocol, achieving approximately 5.46 METs. Reduced exercise tolerance. No chest pain. The blood pressure response was normal. Myocardial perfusion is normal. There was mild gut uptake artifact in the inferior wall and apical thinning. Minimal ischemia in this region cannot be excluded.  Overall LV systolic function is normal without regional wall motion abnormalities. Stress LV EF: 66%.  No previous exam available for comparison. Low risk.   Heart Catheterization: None  CAC scoring 06/14/2021:  Coronary calcium score of 437 is at the 81st  percentile for the patient's age, sex and race.  LABORATORY DATA: CBC Latest Ref Rng & Units 04/05/2021 02/21/2021 05/31/2020  WBC 4.0 - 10.5 K/uL 5.3 7.8 6.7  Hemoglobin 13.0 - 17.0 g/dL 12.8(L) 13.2 13.8  Hematocrit 39.0 - 52.0 % 37.3(L) 37.8(L) 40.6  Platelets 150.0 - 400.0 K/uL 224.0 238.0 265    CMP Latest Ref Rng & Units 04/05/2021 02/21/2021 05/31/2020  Glucose 70 - 99 mg/dL 96 89 98  BUN 6 - 23 mg/dL _0 Creatinine 0.40 - 1.50 mg/dL 1.00 0.91 0.82  Sodium 135 - 145 mEq/L 134(L) 130(L) 135  Potassium 3.5 - 5.1 mEq/L 4.5 4.2 4.5  Chloride 96 - 112 mEq/L 98 96 102  CO2 19 - 32 mEq/L _1 Calcium 8.4 - 10.5 mg/dL 9.4 9.5 9.6  Total Protein 6.0 - 8.3 g/dL - 6.8 -  Total Bilirubin 0.2 - 1.2 mg/dL - 1.0 -  Alkaline Phos 39 - 117 U/L - 62 -  AST 0 - 37 U/L - 36 -  ALT 0 - 53 U/L - 31 -    Lipid Panel  .  No components found for: NTPROBNP No results for input(s): PROBNP in the last 8760 hours. No results for input(s): TSH in the last 8760 hours.  BMP Recent Labs    02/21/21 1141 04/05/21 0855  NA 130* 134*  K 4.2 4.5  CL 96 98  CO2 25 27  GLUCOSE 89 96  BUN 19 20  CREATININE 0.91 1.00  CALCIUM 9.5 9.4    HEMOGLOBIN A1C Lab Results  Component Value Date   HGBA1C 6.2 02/21/2021   External Labs: Collected:02/21/2021 Total cholesterol 132, triglycerides 61, HDL 53, LDL 67, non-HDL 78  IMPRESSION:    ICD-10-CM   1. Coronary atherosclerosis due to calcified coronary lesion  I25.10    I25.84     2. Dizziness  R42 EKG 12-Lead    CANCELED: EKG 12-Lead    3. Benign hypertension  I10     4. Dyslipidemia, goal LDL below 100  E78.5        RECOMMENDATIONS: [name] is a 66 y.o. male whose past medical history and cardiac risk factors include: Severe coronary artery calcification, dyslipidemia, prediabetes, hypertension, advanced age.  Coronary atherosclerosis due to calcified coronary lesion without angina pectoris Total CAC 437 AU, 81st  percentile. EKG normal sinus without underlying injury pattern. Continue aspirin and statin therapy. Extensively reviewed his recent exercise nuclear stress test results with the patient providing him illustrations and review of SPECT images at today's office visit. Overall functional status remains relatively stable and he is asymptomatic with regards to angina pectoris or heart failure symptoms. For now would recommend working on improving his modifiable cardiovascular risk factors. Patient is asked to seek medical attention if  he has anginal discomfort as discussed at today's office visit, decrease in physical endurance, or change in clinical status concerning for cardiac etiology.  Dizziness Symptoms suggestive of orthostasis and orthostatic vital signs were positive. I suspect is most likely due to his diuretic use as he is on torsemide 20 mg p.o. daily. I have asked him to discontinue torsemide for now until he reaches out to his PCP. He denies any near-syncope or syncopal event.  Benign hypertension Office blood pressures are soft and given him being orthostatic and symptomatic will discontinue torsemide for now until he follows up with PCP.  Dyslipidemia, goal LDL below 100 LDL as of 02/2021 67 mg/dL. Currently on statin therapy. Currently managed by primary care provider.   Today's encounter involved management of 1 chronic comorbid conditions, and 1 acute condition (orthostatic hypotension).  Independently reviewed today's EKG and recent stress test with the patient including SPECT images in great detail, medication changes recommended given his orthostasis.  Would like to see him back on an annual basis after his yearly well visit.  Patient is agreeable with the plan of care.  I have also apologized to him with regards to miscommunication/misunderstanding given the prior telephone encounters.  FINAL MEDICATION LIST END OF ENCOUNTER: No orders of the defined types were placed in  this encounter.    Medications Discontinued During This Encounter  Medication Reason   torsemide (DEMADEX) 20 MG tablet Discontinued by provider      Current Outpatient Medications:    alfuzosin (UROXATRAL) 10 MG 24 hr tablet, TAKE 1 TABLET BY MOUTH  DAILY WITH BREAKFAST, Disp: 90 tablet, Rfl: 1   amoxicillin (AMOXIL) 500 MG capsule, Take 500 mg by mouth 2 (two) times daily., Disp: , Rfl:    aspirin EC 81 MG tablet, Take 1 tablet (81 mg total) by mouth daily. Swallow whole., Disp: 30 tablet, Rfl: 11   atorvastatin (LIPITOR) 80 MG tablet, TAKE 1 TABLET BY MOUTH  DAILY, Disp: 90 tablet, Rfl: 0   DULoxetine (CYMBALTA) 60 MG capsule, Take 60 mg by mouth daily., Disp: , Rfl:    dutasteride (AVODART) 0.5 MG capsule, Take 1 capsule (0.5 mg total) by mouth daily., Disp: 90 capsule, Rfl: 1   Fish Oil-Cholecalciferol (FISH OIL + D3 PO), Take 1 capsule by mouth daily., Disp: , Rfl:    fluorouracil (EFUDEX) 5 % cream, Apply topically as needed., Disp: , Rfl:    lamoTRIgine (LAMICTAL) 150 MG tablet, Take 150 mg by mouth 2 (two) times daily., Disp: , Rfl: 1   mesalamine (LIALDA) 1.2 g EC tablet, Take 2 tablets (2.4 g total) by mouth daily., Disp: 150 tablet, Rfl: 3   Multiple Vitamin (MULTIVITAMIN) tablet, Take 1 tablet by mouth daily., Disp: , Rfl:    olmesartan (BENICAR) 40 MG tablet, TAKE 1 TABLET BY MOUTH  DAILY, Disp: 90 tablet, Rfl: 0   QUEtiapine (SEROQUEL) 200 MG tablet, Take 200 mg by mouth at bedtime., Disp: , Rfl:   Orders Placed This Encounter  Procedures   EKG 12-Lead    There are no Patient Instructions on file for this visit.   --Continue cardiac medications as reconciled in final medication list. --Return in about 1 year (around 10/11/2022) for Follow up, Coronary artery calcification. Or sooner if needed. --Continue follow-up with your primary care physician regarding the management of your other chronic comorbid conditions.  Patient's questions and concerns were addressed to his  satisfaction. He voices understanding of the instructions provided during this encounter.   This  this encounter.   This note was created using a voice recognition software as a result there may be grammatical errors inadvertently enclosed that do not reflect the nature of this encounter. Every attempt is made to correct such errors.  Rex Kras, [place], [facility]  Pager: [phone] Office: [phone]

## 2021-10-13 IMAGING — CT CT CARDIAC CORONARY ARTERY CALCIUM SCORE
3 series · 14 of 20 positions shown, 16 images · non-contrast
Comparison: None.

CLINICAL DATA: 65-year-old Caucasian male with history of
hyperlipidemia and hypertension.

EXAM:
CT CARDIAC CORONARY ARTERY CALCIUM SCORE
TECHNIQUE: Non-contrast imaging through the heart was performed using
prospective ECG gating. Image post processing was performed on an
independent workstation, allowing for quantitative analysis of the
heart and coronary arteries. Note that this exam targets the heart
and the chest was not imaged in its entirety.

[Series 2: calcium scoring 2.00 qr36 bestdiast 69% hrt calciu · axial · 0.40mm/px · z∈[+1585,+1681]mm · 4 of 80 slices shown]
[im 16/80  vessel]
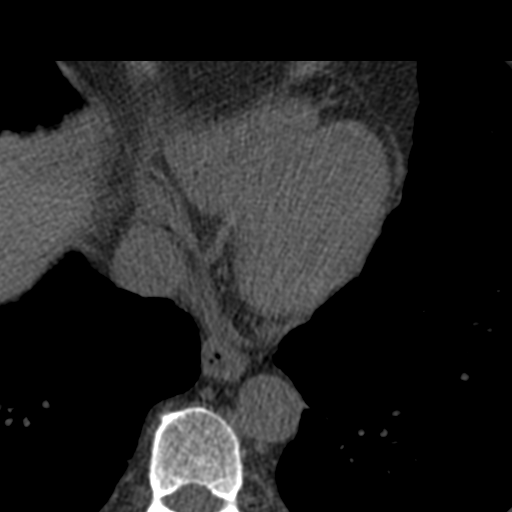
[im 32/80  vessel]
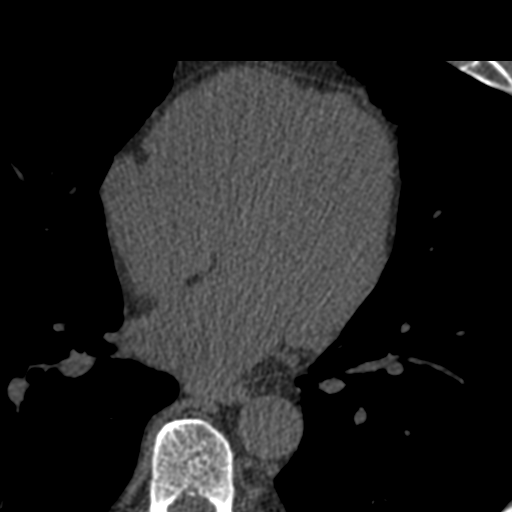
[im 48/80  vessel]
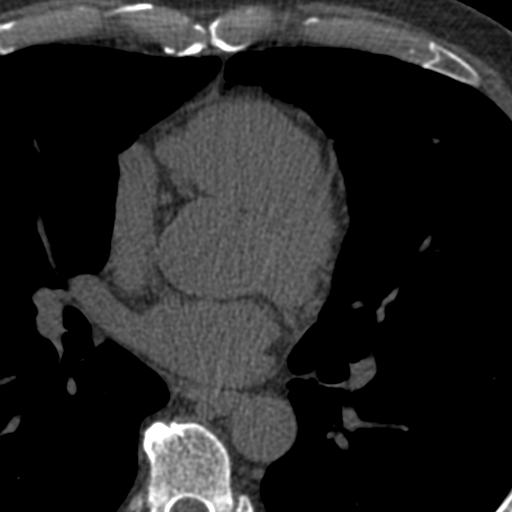
[im 64/80  vessel]
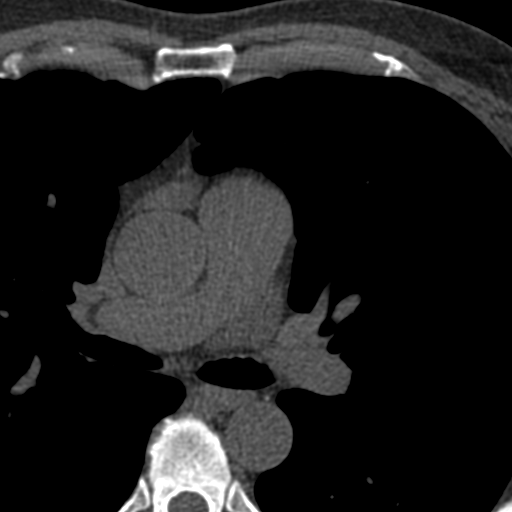

[Series 3: calcium scoring 2.00 br40 bestdiast 69% axial · axial · 0.64mm/px · z∈[+1581,+1685]mm · 5 of 80 slices shown, 7 images]
[im 14/80  vessel]
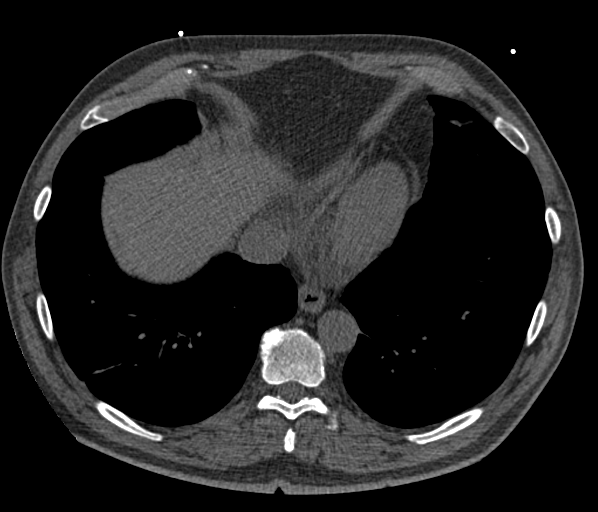
[im 14/80  lung]
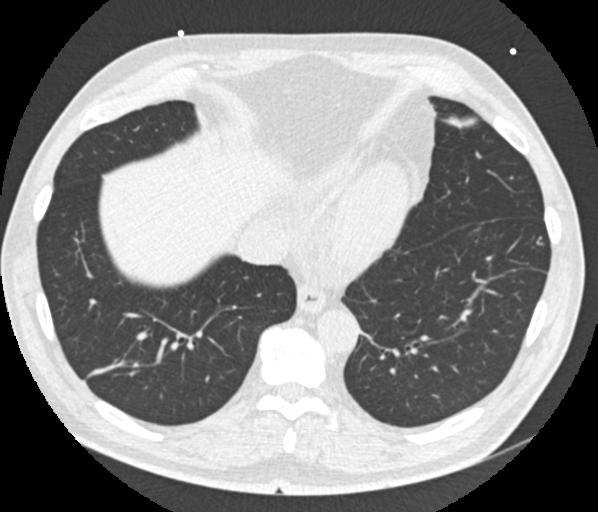
[im 27/80  vessel]
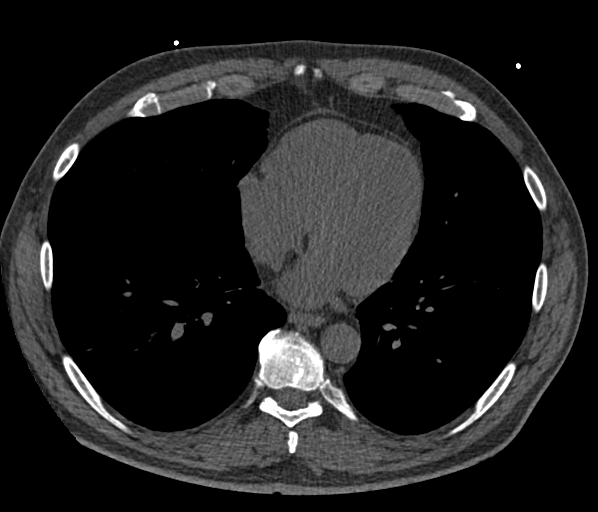
[im 40/80  vessel]
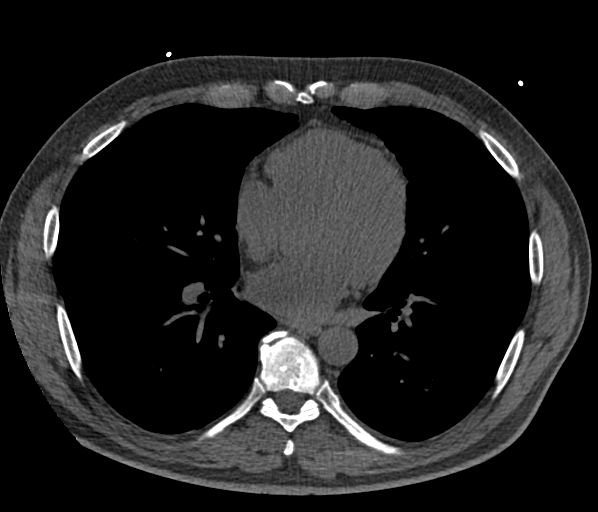
[im 53/80  vessel]
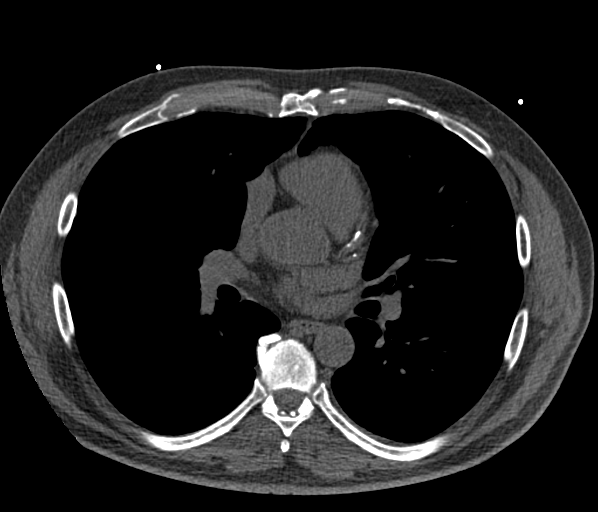
[im 66/80  vessel]
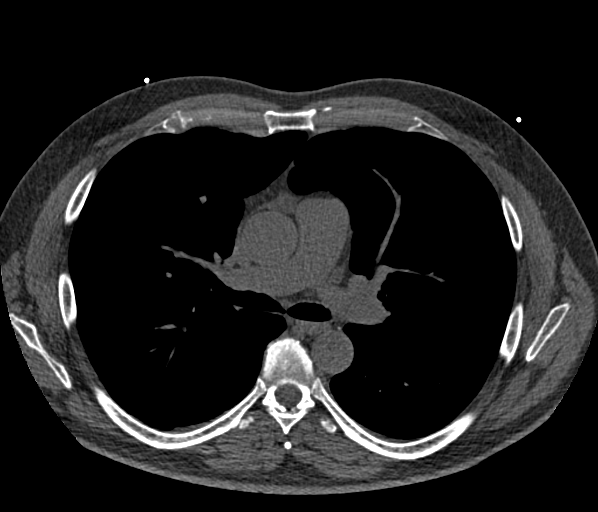
[im 66/80  lung]
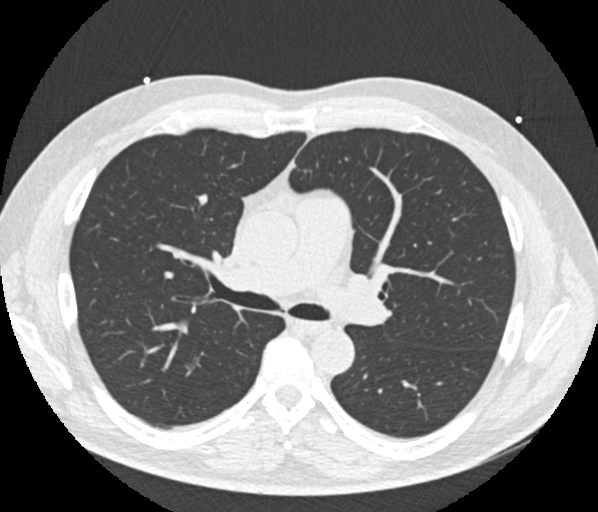

[Series 9: calcium scoring 2.00 br60 bestdiast 69% lungs · axial · 0.62mm/px · z∈[+1581,+1685]mm · 5 of 80 slices shown]
[im 14/80  vessel]
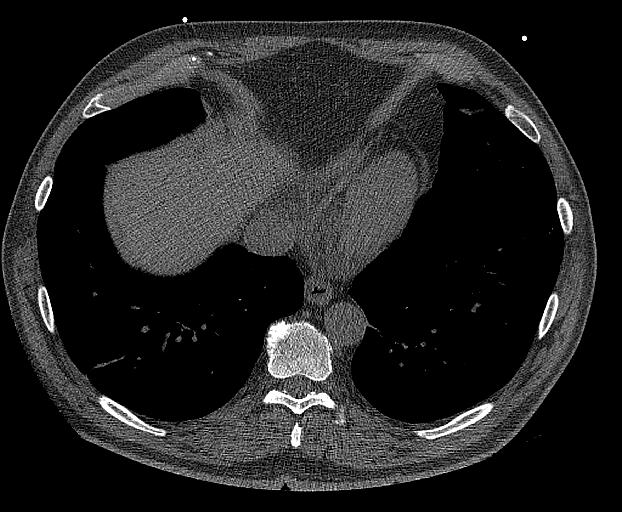
[im 27/80  vessel]
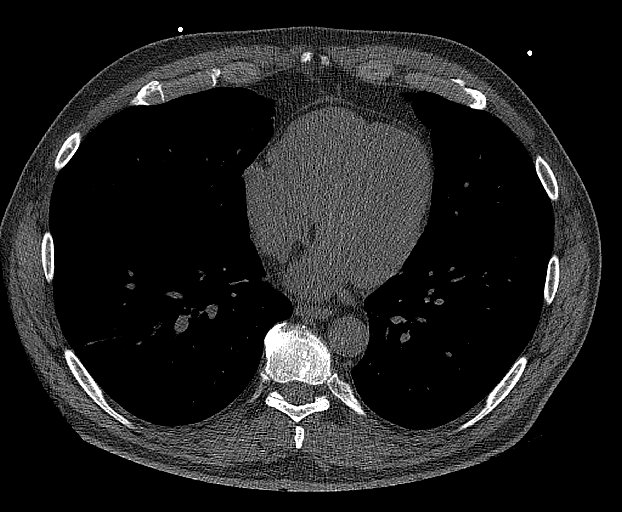
[im 40/80  vessel]
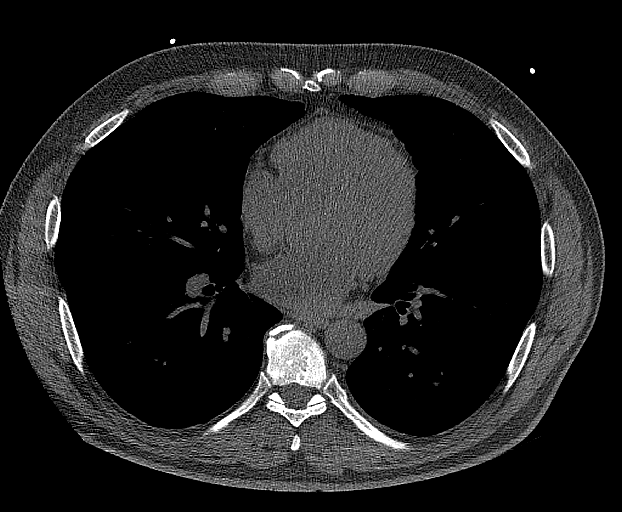
[im 53/80  vessel]
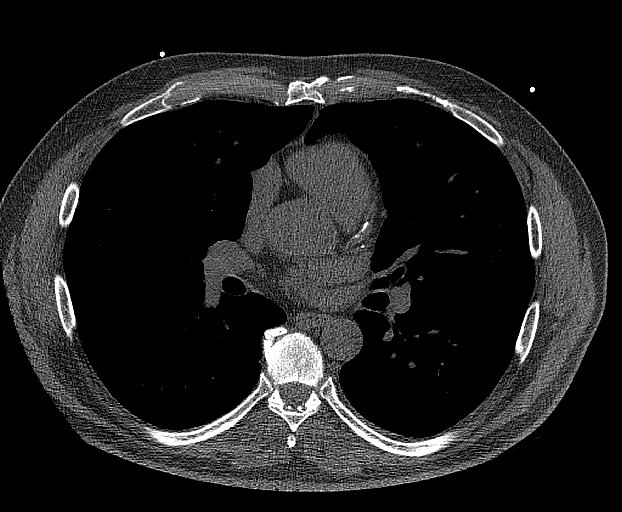
[im 66/80  vessel]
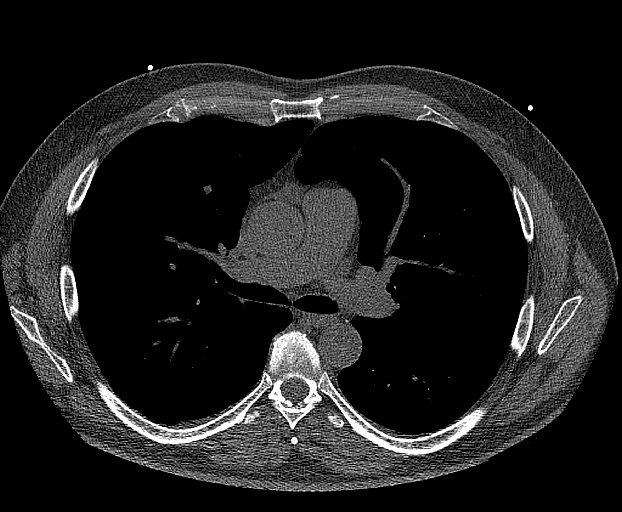

[14 of 20 positions shown; findings below may reference images not displayed]

FINDINGS: CORONARY CALCIUM SCORES:

Left Main:

LAD: 407

LCx: 0

RCA:

Total Agatston Score: 437

[HOSPITAL] percentile: 81

AORTA MEASUREMENTS:

Ascending Aorta: 35 mm

Descending Aorta: 25 mm

OTHER FINDINGS:

The heart size is within normal limits. No pericardial fluid is
identified. Visualized segments of the thoracic aorta and central
pulmonary arteries are normal in caliber. Visualized mediastinum and
hilar regions demonstrate no lymphadenopathy or masses. Visualized
lungs show no evidence of pulmonary edema, consolidation,
pneumothorax, nodule or pleural fluid. Visualized upper abdomen and
bony structures are unremarkable.
IMPRESSION: Coronary calcium score of 437 is at the 81st percentile for the
patient's age, sex and race.

## 2021-10-18 ENCOUNTER — Encounter: Payer: Self-pay | Admitting: Internal Medicine

## 2021-10-23 DIAGNOSIS — L910 Hypertrophic scar: Secondary | ICD-10-CM | POA: Diagnosis not present

## 2021-11-05 ENCOUNTER — Other Ambulatory Visit: Payer: Self-pay | Admitting: Internal Medicine

## 2021-11-05 DIAGNOSIS — R3911 Hesitancy of micturition: Secondary | ICD-10-CM

## 2021-11-05 DIAGNOSIS — N401 Enlarged prostate with lower urinary tract symptoms: Secondary | ICD-10-CM

## 2021-11-13 ENCOUNTER — Other Ambulatory Visit: Payer: Self-pay | Admitting: Internal Medicine

## 2021-11-13 DIAGNOSIS — N401 Enlarged prostate with lower urinary tract symptoms: Secondary | ICD-10-CM

## 2021-11-20 ENCOUNTER — Encounter: Payer: Self-pay | Admitting: Internal Medicine

## 2021-11-24 ENCOUNTER — Other Ambulatory Visit: Payer: Self-pay

## 2021-11-24 ENCOUNTER — Ambulatory Visit (AMBULATORY_SURGERY_CENTER): Payer: Medicare Other

## 2021-11-24 VITALS — Ht 74.0 in | Wt 220.0 lb

## 2021-11-24 DIAGNOSIS — K51019 Ulcerative (chronic) pancolitis with unspecified complications: Secondary | ICD-10-CM

## 2021-11-24 DIAGNOSIS — Z86010 Personal history of colonic polyps: Secondary | ICD-10-CM

## 2021-11-24 NOTE — Progress Notes (Signed)
? ? ?  Patient's pre-visit was done today over the phone with the patient  ? ?Name,DOB and address verified.  ?  ?Patient denies any allergies to Eggs and Soy.  ? ?Patient denies any problems with anesthesia/sedation. ? ?Patient denies taking diet pills or blood thinners.  ? ?Denies atrial flutter or atrial fib ? ?Denies chronic constipation ? ?No home Oxygen.  ? ?Packet of Prep instructions sent by My Chart or mail to patient including a copy of a consent form if by mail-pt is aware.  ?Patient understands to call us back with any questions or concerns.  ?Patient is aware of our care-partner policy and [id number] safety protocol.  ? ?EMMI education assigned to the patient for the procedure, sent to [place] [facility].  ? ? Pt notes he is adopted  and  has no knowledge of family hx ? ?  ?

## 2021-11-27 DIAGNOSIS — L910 Hypertrophic scar: Secondary | ICD-10-CM | POA: Diagnosis not present

## 2021-11-28 ENCOUNTER — Ambulatory Visit (INDEPENDENT_AMBULATORY_CARE_PROVIDER_SITE_OTHER): Payer: Medicare Other | Admitting: Internal Medicine

## 2021-11-28 ENCOUNTER — Encounter: Payer: Self-pay | Admitting: Internal Medicine

## 2021-11-28 VITALS — BP 128/74 | HR 83 | Temp 98.0°F | Ht 74.0 in | Wt 225.0 lb

## 2021-11-28 DIAGNOSIS — N401 Enlarged prostate with lower urinary tract symptoms: Secondary | ICD-10-CM | POA: Diagnosis not present

## 2021-11-28 DIAGNOSIS — R7303 Prediabetes: Secondary | ICD-10-CM

## 2021-11-28 DIAGNOSIS — D539 Nutritional anemia, unspecified: Secondary | ICD-10-CM | POA: Diagnosis not present

## 2021-11-28 DIAGNOSIS — I10 Essential (primary) hypertension: Secondary | ICD-10-CM | POA: Diagnosis not present

## 2021-11-28 DIAGNOSIS — E871 Hypo-osmolality and hyponatremia: Secondary | ICD-10-CM | POA: Diagnosis not present

## 2021-11-28 DIAGNOSIS — R3911 Hesitancy of micturition: Secondary | ICD-10-CM | POA: Diagnosis not present

## 2021-11-28 LAB — BASIC METABOLIC PANEL
BUN: 15 mg/dL (ref 6–23)
CO2: 22 mEq/L (ref 19–32)
Calcium: 10 mg/dL (ref 8.4–10.5)
Chloride: 96 mEq/L (ref 96–112)
Creatinine, Ser: 0.89 mg/dL (ref 0.40–1.50)
GFR: 89.75 mL/min (ref >60.00)
Glucose, Bld: 108 mg/dL — ABNORMAL HIGH (ref 70–99)
Potassium: 4.3 mEq/L (ref 3.5–5.1)
Sodium: 128 mEq/L — ABNORMAL LOW (ref 135–145)

## 2021-11-28 LAB — CBC WITH DIFFERENTIAL/PLATELET
Basophils Absolute: 0.1 10*3/uL (ref 0.0–0.1)
Basophils Relative: 0.9 % (ref 0.0–3.0)
Eosinophils Absolute: 0.1 10*3/uL (ref 0.0–0.7)
Eosinophils Relative: 1.2 % (ref 0.0–5.0)
HCT: 38 % — ABNORMAL LOW (ref 39.0–52.0)
Hemoglobin: 13.2 g/dL (ref 13.0–17.0)
Lymphocytes Relative: 22.2 % (ref 12.0–46.0)
Lymphs Abs: 1.4 10*3/uL (ref 0.7–4.0)
MCHC: 34.7 g/dL (ref 30.0–36.0)
MCV: 93.5 fl (ref 78.0–100.0)
Monocytes Absolute: 0.5 10*3/uL (ref 0.1–1.0)
Monocytes Relative: 8.4 % (ref 3.0–12.0)
Neutro Abs: 4.4 10*3/uL (ref 1.4–7.7)
Neutrophils Relative %: 67.3 % (ref 43.0–77.0)
Platelets: 234 10*3/uL (ref 150.0–400.0)
RBC: 4.07 Mil/uL — ABNORMAL LOW (ref 4.22–5.81)
RDW: 12.8 % (ref 11.5–15.5)
WBC: 6.5 10*3/uL (ref 4.0–10.5)

## 2021-11-28 LAB — PSA: PSA: 2.08 ng/mL (ref 0.10–4.00)

## 2021-11-28 LAB — HEMOGLOBIN A1C: Hgb A1c MFr Bld: 6.1 % (ref 4.6–6.5)

## 2021-11-28 MED ORDER — TADALAFIL 5 MG PO TABS: 5.0000 mg | ORAL_TABLET | Freq: Every day | ORAL | 1 refills | Status: DC

## 2021-11-28 NOTE — Progress Notes (Signed)
? ?Subjective:  ?Patient ID: [name], male    DOB: [birth year]  Age: 66 y.o. MRN: [id number] ? ?CC: Hypertension ? ?This visit occurred during the SARS-CoV-2 public health emergency.  Safety protocols were in place, including screening questions prior to the visit, additional usage of staff PPE, and extensive cleaning of exam room while observing appropriate contact time as indicated for disinfecting solutions.   ? ?HPI ?[name] presents for f/up - ? ?He continues to complain of nocturia, urinary straining, and urinary hesitancy.  He has gotten minimal symptom relief with Uroxatrol and Avodart.  He drinks about 48 ounces of water a day.  His cardiologist recently told him to stop taking furosemide.  He denies dizziness, lightheadedness, or edema. ? ?Outpatient Medications Prior to Visit  ?Medication Sig Dispense Refill  ? alfuzosin (UROXATRAL) 10 MG 24 hr tablet TAKE 1 TABLET BY MOUTH  DAILY WITH BREAKFAST 90 tablet 0  ? amoxicillin (AMOXIL) 500 MG capsule Take 500 mg by mouth 2 (two) times daily.    ? aspirin EC 81 MG tablet Take 1 tablet (81 mg total) by mouth daily. Swallow whole. 30 tablet 11  ? atorvastatin (LIPITOR) 80 MG tablet TAKE 1 TABLET BY MOUTH  DAILY 90 tablet 0  ? DULoxetine (CYMBALTA) 60 MG capsule Take 60 mg by mouth daily.    ? dutasteride (AVODART) 0.5 MG capsule TAKE 1 CAPSULE BY MOUTH  DAILY 90 capsule 0  ? Fish Oil-Cholecalciferol (FISH OIL + D3 PO) Take 1 capsule by mouth daily.    ? fluorouracil (EFUDEX) 5 % cream Apply topically as needed.    ? lamoTRIgine (LAMICTAL) 150 MG tablet Take 150 mg by mouth 2 (two) times daily.  1  ? mesalamine (LIALDA) 1.2 g EC tablet Take 2 tablets (2.4 g total) by mouth daily. 150 tablet 3  ? Multiple Vitamin (MULTIVITAMIN) tablet Take 1 tablet by mouth daily.    ? olmesartan (BENICAR) 40 MG tablet TAKE 1 TABLET BY MOUTH  DAILY 90 tablet 0  ? QUEtiapine (SEROQUEL) 200 MG tablet Take 200 mg by mouth at bedtime.    ? ?No facility-administered  medications prior to visit.  ? ? ?ROS ?Review of Systems  ?Constitutional:  Negative for chills, diaphoresis, fatigue and fever.  ?HENT: Negative.    ?Eyes: Negative.   ?Respiratory:  Negative for cough, chest tightness, shortness of breath and wheezing.   ?Cardiovascular:  Negative for chest pain and leg swelling.  ?Gastrointestinal:  Negative for abdominal pain, diarrhea, nausea and vomiting.  ?Endocrine: Negative.   ?Genitourinary:  Positive for difficulty urinating. Negative for dysuria, frequency, hematuria and urgency.  ?Musculoskeletal: Negative.  Negative for arthralgias and myalgias.  ?Skin: Negative.   ?Neurological:  Negative for dizziness, seizures, speech difficulty, weakness, light-headedness and headaches.  ?Hematological:  Negative for adenopathy. Does not bruise/bleed easily.  ?Psychiatric/Behavioral: Negative.    ? ?Objective:  ?BP 128/74 (BP Location: Left Arm, Patient Position: Sitting, Cuff Size: Large)   Pulse 83   Temp 98 ?F (36.7 ?C) (Oral)   Ht 6' 2"  (1.88 m)   Wt 225 lb (102.1 kg)   SpO2 96%   BMI 28.89 kg/m?  ? ?BP Readings from Last 3 Encounters:  ?11/28/21 128/74  ?10/11/21 100/63  ?09/07/21 120/70  ? ? ?Wt Readings from Last 3 Encounters:  ?11/28/21 225 lb (102.1 kg)  ?11/24/21 220 lb (99.8 kg)  ?10/11/21 224 lb 6.4 oz (101.8 kg)  ? ? ?Physical Exam ?Vitals reviewed.  ?HENT:  ?  Mouth/Throat:  ?   Mouth: Mucous membranes are moist.  ?Eyes:  ?   General: No scleral icterus. ?   Conjunctiva/sclera: Conjunctivae normal.  ?Cardiovascular:  ?   Rate and Rhythm: Normal rate and regular rhythm.  ?   Heart sounds: No murmur heard. ?Pulmonary:  ?   Effort: Pulmonary effort is normal.  ?   Breath sounds: No stridor. No wheezing, rhonchi or rales.  ?Abdominal:  ?   General: Abdomen is flat.  ?   Palpations: There is no mass.  ?   Tenderness: There is no abdominal tenderness. There is no guarding.  ?   Hernia: No hernia is present.  ?Musculoskeletal:     ?   General: Normal range of motion.   ?   Cervical back: Neck supple.  ?   Right lower leg: No edema.  ?   Left lower leg: No edema.  ?Lymphadenopathy:  ?   Cervical: No cervical adenopathy.  ?Skin: ?   General: Skin is warm and dry.  ?   Coloration: Skin is not pale.  ?Neurological:  ?   General: No focal deficit present.  ?   Mental Status: He is alert. Mental status is at baseline.  ?Psychiatric:     ?   Mood and Affect: Mood normal.     ?   Behavior: Behavior normal.  ? ? ?Lab Results  ?Component Value Date  ? WBC 6.5 11/28/2021  ? HGB 13.2 11/28/2021  ? HCT 38.0 (L) 11/28/2021  ? PLT 234.0 11/28/2021  ? GLUCOSE 108 (H) 11/28/2021  ? CHOL 132 02/21/2021  ? TRIG 61.0 02/21/2021  ? HDL 53.20 02/21/2021  ? LDLDIRECT 102.0 09/09/2017  ? [place] 67 02/21/2021  ? ALT 31 02/21/2021  ? AST 36 02/21/2021  ? NA 128 (L) 11/28/2021  ? K 4.3 11/28/2021  ? CL 96 11/28/2021  ? CREATININE 0.89 11/28/2021  ? BUN 15 11/28/2021  ? CO2 22 11/28/2021  ? TSH 0.79 02/03/2020  ? PSA 2.08 11/28/2021  ? INR 0.9 03/08/2008  ? HGBA1C 6.1 11/28/2021  ? ? ?CT CARDIAC SCORING ([facility] LOCATIONS ONLY) ? ?Result Date: 06/14/2021 ?CLINICAL DATA:  66 year old Caucasian male with history of hyperlipidemia and hypertension. EXAM: CT CARDIAC CORONARY ARTERY CALCIUM SCORE TECHNIQUE: Non-contrast imaging through the heart was performed using prospective ECG gating. Image post processing was performed on an independent workstation, allowing for quantitative analysis of the heart and coronary arteries. Note that this exam targets the heart and the chest was not imaged in its entirety. COMPARISON:  None. FINDINGS: CORONARY CALCIUM SCORES: Left Main: 29.4 LAD: 407 LCx: 0 RCA: 0.4 Total Agatston Score: 437 MESA database percentile: 81 AORTA MEASUREMENTS: Ascending Aorta: 35 mm Descending Aorta: 25 mm OTHER FINDINGS: The heart size is within normal limits. No pericardial fluid is identified. Visualized segments of the thoracic aorta and central pulmonary arteries are normal in caliber. Visualized  mediastinum and hilar regions demonstrate no lymphadenopathy or masses. Visualized lungs show no evidence of pulmonary edema, consolidation, pneumothorax, nodule or pleural fluid. Visualized upper abdomen and bony structures are unremarkable. IMPRESSION: Coronary calcium score of 437 is at the 81st percentile for the patient's age, sex and race. Electronically Signed   By: Aletta Edouard M.D.   On: 06/14/2021 16:14  ? ? ?Assessment & Plan:  ? ?Olanrewaju was seen today for hypertension. ? ?Diagnoses and all orders for this visit: ? ?Essential hypertension- His blood pressure is well controlled. ?-     Basic metabolic  panel; Future ?-     CBC with Differential/Platelet; Future ?-     CBC with Differential/Platelet ?-     Basic metabolic panel ? ?Chronic hyponatremia- His urine sodium is normal.  Will treat with a sodium supplement. ?-     Basic metabolic panel; Future ?-     Sodium, urine, random; Future ?-     Sodium, urine, random ?-     Basic metabolic panel ?-     sodium chloride 1 g tablet; Take 1 tablet (1 g total) by mouth 2 (two) times daily with a meal. ? ?Deficiency anemia- He is mildly anemic.  Will recheck this the next time I see him. ?-     CBC with Differential/Platelet; Future ?-     CBC with Differential/Platelet ? ?Benign prostatic hyperplasia with urinary hesitancy- His PSA is not rising.  Will add a PDE 5 inhibitor to the current meds. ?-     PSA; Future ?-     tadalafil (CIALIS) 5 MG tablet; Take 1 tablet (5 mg total) by mouth daily. ?-     PSA ? ?Prediabetes- This does not need to be treated with a medication. ?-     Hemoglobin A1c; Future ?-     Hemoglobin A1c ? ? ?I am having [name] start on tadalafil and sodium chloride. I am also having him maintain his DULoxetine, lamoTRIgine, multivitamin, QUEtiapine, fluorouracil, amoxicillin, Fish Oil-Cholecalciferol (FISH OIL + D3 PO), mesalamine, aspirin EC, atorvastatin, olmesartan, dutasteride, and alfuzosin. ? ?Meds ordered this encounter   ?Medications  ? tadalafil (CIALIS) 5 MG tablet  ?  Sig: Take 1 tablet (5 mg total) by mouth daily.  ?  Dispense:  90 tablet  ?  Refill:  1  ? sodium chloride 1 g tablet  ?  Sig: Take 1 tablet (1 g total) by mouth 2 (two

## 2021-11-28 NOTE — Patient Instructions (Signed)
Hyponatremia ?Hyponatremia is when the amount of salt (sodium) in your blood is too low. When salt levels are low, your body cells may take in extra water. This can cause swelling. The swelling often affects the brain. ?What are the causes? ?Certain medical problems or conditions. ?Vomiting a lot. ?Having watery poop (diarrhea) often. ?Sweating too much. ?Taking certain medicines or using illegal drugs. ?Fluids given through an IV tube. ?What increases the risk? ?Having heart, kidney, or liver failure. ?Having a medical condition that causes you to have watery poop a lot. ?Doing very hard exercises. ?Taking medicines that affect the amount of salt that is in your blood. ?What are the signs or symptoms? ?Symptoms of this condition include: ?Headache. ?Feeling like you may vomit (nausea). ?Vomiting. ?Being very tired. ?Muscle weakness and cramps. ?Not wanting to eat as much as normal. ?Feeling weak or dizzy. ?Very bad symptoms of this condition include: ?Confusion. ?Feeling restless. ?Having a fast heart rate. ?Fainting. ?Seizures. ?Coma. ?How is this treated? ?Treatment for this condition depends on the cause. Treatment may include: ?Getting fluids through an IV tube that is put into one of your veins. ?Taking medicines to fix the salt levels in your blood. If medicines are causing the problem, your medicines will need to be changed. ?Limiting how much water or fluid you take in, in some cases. ?Monitoring in the hospital to watch your symptoms. ?Follow these instructions at home: ? ?Take over-the-counter and prescription medicines only as told by your doctor. Many medicines can make this condition worse. Talk with your doctor about any medicines that you are taking. ?Do not drink alcohol. ?Keep all follow-up visits. ?Contact a doctor if: ?You feel more like you may vomit. ?You feel more tired. ?Your headache gets worse. ?You feel more confused. ?You feel weaker. ?Your symptoms go away and then they come back. ?Get  help right away if: ?You have a seizure. ?You faint. ?You keep having watery poop. ?You keep vomiting. ?Summary ?Hyponatremia is when the amount of salt in your blood is too low. ?When salt levels are low, you can have swelling throughout the body. The swelling mostly affects the brain. ?Treatment depends on the cause. ?Treatment may include IV fluids and changing medicines. ?This information is not intended to replace advice given to you by your health care provider. Make sure you discuss any questions you have with your health care provider. ?Document Revised: 02/28/2021 Document Reviewed: 02/28/2021 ?Elsevier Patient Education ? [employer]. ? ?

## 2021-11-29 LAB — SODIUM, URINE, RANDOM: Sodium, Ur: 46 mmol/L (ref 28–272)

## 2021-11-29 MED ORDER — SODIUM CHLORIDE 1 G PO TABS: 1.0000 g | ORAL_TABLET | Freq: Two times a day (BID) | ORAL | 0 refills | Status: DC

## 2021-11-30 ENCOUNTER — Other Ambulatory Visit: Payer: Self-pay | Admitting: Internal Medicine

## 2021-11-30 ENCOUNTER — Encounter: Payer: Self-pay | Admitting: Internal Medicine

## 2021-11-30 DIAGNOSIS — N401 Enlarged prostate with lower urinary tract symptoms: Secondary | ICD-10-CM

## 2021-11-30 MED ORDER — TADALAFIL 5 MG PO TABS: 5.0000 mg | ORAL_TABLET | Freq: Every day | ORAL | 1 refills | Status: DC

## 2021-12-02 ENCOUNTER — Encounter: Payer: Self-pay | Admitting: Internal Medicine

## 2021-12-05 ENCOUNTER — Encounter: Payer: Self-pay | Admitting: Internal Medicine

## 2021-12-07 ENCOUNTER — Encounter: Payer: Medicare Other | Admitting: Internal Medicine

## 2021-12-15 ENCOUNTER — Other Ambulatory Visit: Payer: Self-pay | Admitting: Internal Medicine

## 2021-12-15 DIAGNOSIS — E785 Hyperlipidemia, unspecified: Secondary | ICD-10-CM

## 2021-12-15 DIAGNOSIS — I10 Essential (primary) hypertension: Secondary | ICD-10-CM

## 2021-12-15 DIAGNOSIS — E871 Hypo-osmolality and hyponatremia: Secondary | ICD-10-CM

## 2021-12-15 MED ORDER — ATORVASTATIN CALCIUM 80 MG PO TABS: 80.0000 mg | ORAL_TABLET | Freq: Every day | ORAL | 1 refills | Status: DC

## 2021-12-15 MED ORDER — OLMESARTAN MEDOXOMIL 40 MG PO TABS: 40.0000 mg | ORAL_TABLET | Freq: Every day | ORAL | 1 refills | Status: DC

## 2021-12-26 DIAGNOSIS — M11262 Other chondrocalcinosis, left knee: Secondary | ICD-10-CM | POA: Diagnosis not present

## 2021-12-26 DIAGNOSIS — S83272A Complex tear of lateral meniscus, current injury, left knee, initial encounter: Secondary | ICD-10-CM | POA: Diagnosis not present

## 2021-12-26 DIAGNOSIS — M94262 Chondromalacia, left knee: Secondary | ICD-10-CM | POA: Diagnosis not present

## 2021-12-26 DIAGNOSIS — S83232A Complex tear of medial meniscus, current injury, left knee, initial encounter: Secondary | ICD-10-CM | POA: Diagnosis not present

## 2021-12-28 ENCOUNTER — Encounter: Payer: Self-pay | Admitting: Internal Medicine

## 2021-12-28 DIAGNOSIS — Z85828 Personal history of other malignant neoplasm of skin: Secondary | ICD-10-CM | POA: Diagnosis not present

## 2021-12-28 DIAGNOSIS — L910 Hypertrophic scar: Secondary | ICD-10-CM | POA: Diagnosis not present

## 2022-01-01 ENCOUNTER — Encounter: Payer: Self-pay | Admitting: Internal Medicine

## 2022-01-11 ENCOUNTER — Encounter: Payer: Self-pay | Admitting: Internal Medicine

## 2022-01-24 ENCOUNTER — Encounter: Payer: Self-pay | Admitting: Internal Medicine

## 2022-01-26 ENCOUNTER — Other Ambulatory Visit: Payer: Self-pay | Admitting: Internal Medicine

## 2022-01-26 DIAGNOSIS — E871 Hypo-osmolality and hyponatremia: Secondary | ICD-10-CM

## 2022-01-26 DIAGNOSIS — I10 Essential (primary) hypertension: Secondary | ICD-10-CM

## 2022-01-30 DIAGNOSIS — L910 Hypertrophic scar: Secondary | ICD-10-CM | POA: Diagnosis not present

## 2022-02-13 ENCOUNTER — Ambulatory Visit (AMBULATORY_SURGERY_CENTER): Payer: Self-pay | Admitting: *Deleted

## 2022-02-13 VITALS — Ht 74.0 in | Wt 231.0 lb

## 2022-02-13 DIAGNOSIS — Z86010 Personal history of colonic polyps: Secondary | ICD-10-CM

## 2022-02-13 DIAGNOSIS — K5100 Ulcerative (chronic) pancolitis without complications: Secondary | ICD-10-CM

## 2022-02-13 NOTE — Progress Notes (Signed)
No egg or soy allergy known to patient  No issues known to pt with past sedation with any surgeries or procedures Patient denies ever being told they had issues or difficulty with intubation  No FH of Malignant Hyperthermia Pt is not on diet pills Pt is not on  home 02  Pt is not on blood thinners  Pt denies issues with constipation  No A fib or A flutter Pt has a suprep kit at home from previous PV

## 2022-02-14 ENCOUNTER — Other Ambulatory Visit: Payer: Self-pay | Admitting: Internal Medicine

## 2022-02-14 DIAGNOSIS — N401 Enlarged prostate with lower urinary tract symptoms: Secondary | ICD-10-CM

## 2022-02-15 ENCOUNTER — Other Ambulatory Visit: Payer: Self-pay | Admitting: Internal Medicine

## 2022-02-15 DIAGNOSIS — N401 Enlarged prostate with lower urinary tract symptoms: Secondary | ICD-10-CM

## 2022-02-20 ENCOUNTER — Other Ambulatory Visit: Payer: Self-pay | Admitting: Internal Medicine

## 2022-02-20 DIAGNOSIS — N401 Enlarged prostate with lower urinary tract symptoms: Secondary | ICD-10-CM

## 2022-02-26 ENCOUNTER — Other Ambulatory Visit: Payer: Self-pay

## 2022-02-26 MED ORDER — MESALAMINE 1.2 G PO TBEC: 2.4000 g | DELAYED_RELEASE_TABLET | Freq: Every day | ORAL | 3 refills | Status: DC

## 2022-03-05 ENCOUNTER — Encounter: Payer: Self-pay | Admitting: Internal Medicine

## 2022-03-05 ENCOUNTER — Ambulatory Visit: Payer: Medicare Other | Admitting: Cardiology

## 2022-03-08 DIAGNOSIS — C44529 Squamous cell carcinoma of skin of other part of trunk: Secondary | ICD-10-CM | POA: Diagnosis not present

## 2022-03-08 DIAGNOSIS — L910 Hypertrophic scar: Secondary | ICD-10-CM | POA: Diagnosis not present

## 2022-03-08 DIAGNOSIS — C44519 Basal cell carcinoma of skin of other part of trunk: Secondary | ICD-10-CM | POA: Diagnosis not present

## 2022-03-12 ENCOUNTER — Encounter: Payer: Self-pay | Admitting: Internal Medicine

## 2022-03-13 ENCOUNTER — Encounter: Payer: Self-pay | Admitting: Internal Medicine

## 2022-03-13 ENCOUNTER — Ambulatory Visit (AMBULATORY_SURGERY_CENTER): Payer: Medicare Other | Admitting: Internal Medicine

## 2022-03-13 VITALS — BP 130/88 | HR 69 | Temp 96.9°F | Resp 19 | Ht 74.0 in | Wt 231.0 lb

## 2022-03-13 DIAGNOSIS — K5100 Ulcerative (chronic) pancolitis without complications: Secondary | ICD-10-CM | POA: Diagnosis not present

## 2022-03-13 DIAGNOSIS — Z86010 Personal history of colonic polyps: Secondary | ICD-10-CM

## 2022-03-13 DIAGNOSIS — Z09 Encounter for follow-up examination after completed treatment for conditions other than malignant neoplasm: Secondary | ICD-10-CM

## 2022-03-13 DIAGNOSIS — K6389 Other specified diseases of intestine: Secondary | ICD-10-CM | POA: Diagnosis not present

## 2022-03-13 DIAGNOSIS — K529 Noninfective gastroenteritis and colitis, unspecified: Secondary | ICD-10-CM | POA: Diagnosis not present

## 2022-03-13 MED ORDER — SODIUM CHLORIDE 0.9 % IV SOLN: 500.0000 mL | Freq: Once | INTRAVENOUS | Status: DC

## 2022-03-13 NOTE — Progress Notes (Signed)
Called to room to assist during endoscopic procedure.  Patient ID and intended procedure confirmed with present staff. Received instructions for my participation in the procedure from the performing physician.  

## 2022-03-13 NOTE — Progress Notes (Signed)
Pt in recovery with monitors in place, VSS. Report given to receiving RN. Bite guard was placed with pt awake to ensure comfort. No dental or soft tissue damage noted.

## 2022-03-13 NOTE — Patient Instructions (Addendum)
I did not see any signs of colitis, no polyps - standard biopsies taken.  You do have diverticulosis and hemorrhoids.  I will let you know pathology results and when to have another routine colonoscopy by mail and/or My Chart.  I appreciate the opportunity to care for you. Gatha Mayer, MD, [facility]   Discharge instructions given. Handouts on Diverticulosis and Hemorrhoids. Resume previous medications. YOU HAD AN ENDOSCOPIC PROCEDURE TODAY AT [facility]:   Refer to the procedure report that was given to you for any specific questions about what was found during the examination.  If the procedure report does not answer your questions, please call your gastroenterologist to clarify.  If you requested that your care partner not be given the details of your procedure findings, then the procedure report has been included in a sealed envelope for you to review at your convenience later.  YOU SHOULD EXPECT: Some feelings of bloating in the abdomen. Passage of more gas than usual.  Walking can help get rid of the air that was put into your GI tract during the procedure and reduce the bloating. If you had a lower endoscopy (such as a colonoscopy or flexible sigmoidoscopy) you may notice spotting of blood in your stool or on the toilet paper. If you underwent a bowel prep for your procedure, you may not have a normal bowel movement for a few days.  Please Note:  You might notice some irritation and congestion in your nose or some drainage.  This is from the oxygen used during your procedure.  There is no need for concern and it should clear up in a day or so.  SYMPTOMS TO REPORT IMMEDIATELY:  Following lower endoscopy (colonoscopy or flexible sigmoidoscopy):  Excessive amounts of blood in the stool  Significant tenderness or worsening of abdominal pains  Swelling of the abdomen that is new, acute  Fever of 100F or higher   For urgent or emergent issues, a gastroenterologist can be  reached at any hour by calling [phone]. Do not use MyChart messaging for urgent concerns.    DIET:  We do recommend a small meal at first, but then you may proceed to your regular diet.  Drink plenty of fluids but you should avoid alcoholic beverages for 24 hours.  ACTIVITY:  You should plan to take it easy for the rest of today and you should NOT DRIVE or use heavy machinery until tomorrow (because of the sedation medicines used during the test).    FOLLOW UP: Our staff will call the number listed on your records the next business day following your procedure.  We will call around 7:15- 8:00 am to check on you and address any questions or concerns that you may have regarding the information given to you following your procedure. If we do not reach you, we will leave a message.  If you develop any symptoms (ie: fever, flu-like symptoms, shortness of breath, cough etc.) before then, please call [phone].  If you test positive for Covid 19 in the 2 weeks post procedure, please call and report this information to [place].    If any biopsies were taken you will be contacted by phone or by letter within the next 1-3 weeks.  Please call us at [phone] if you have not heard about the biopsies in 3 weeks.    SIGNATURES/CONFIDENTIALITY: You and/or your care partner have signed paperwork which will be entered into your electronic medical record.  These signatures attest  to the fact that that the information above on your After Visit Summary has been reviewed and is understood.  Full responsibility of the confidentiality of this discharge information lies with you and/or your care-partner.

## 2022-03-13 NOTE — Progress Notes (Signed)
Pt's states no medical or surgical changes since previsit or office visit. 

## 2022-03-13 NOTE — Op Note (Signed)
[name] Patient Name: [name] Procedure Date: 03/13/2022 7:57 AM MRN: [id number] Endoscopist: Gatha Mayer , MD Age: 66 Referring MD:  Date of Birth: 1955/10/24 Gender: Male Account #: [id number] Procedure:                Colonoscopy Indications:              High risk colon cancer surveillance: Ulcerative                            pancolitis of 8 (or more) years duration, Last                            colonoscopy: 2019 Medicines:                Monitored Anesthesia Care Procedure:                Pre-Anesthesia Assessment:                           - Prior to the procedure, a History and Physical                            was performed, and patient medications and                            allergies were reviewed. The patient's tolerance of                            previous anesthesia was also reviewed. The risks                            and benefits of the procedure and the sedation                            options and risks were discussed with the patient.                            All questions were answered, and informed consent                            was obtained. Prior Anticoagulants: The patient has                            taken no previous anticoagulant or antiplatelet                            agents. ASA Grade Assessment: II - A patient with                            mild systemic disease. After reviewing the risks                            and benefits, the patient was deemed in  satisfactory condition to undergo the procedure.                           After obtaining informed consent, the colonoscope                            was passed under direct vision. Throughout the                            procedure, the patient's blood pressure, pulse, and                            oxygen saturations were monitored continuously. The                            Colonoscope was introduced through the anus and                             advanced to the the cecum, identified by                            appendiceal orifice and ileocecal valve. The                            colonoscopy was somewhat difficult due to a                            redundant colon. Successful completion of the                            procedure was aided by applying abdominal pressure.                            The patient tolerated the procedure well. The                            quality of the bowel preparation was good. The                            ileocecal valve, appendiceal orifice, and rectum                            were photographed. Scope In: 8:07:45 AM Scope Out: 8:30:15 AM Scope Withdrawal Time: 0 hours 15 minutes 36 seconds  Total Procedure Duration: 0 hours 22 minutes 30 seconds  Findings:                 The perianal and digital rectal examinations were                            normal. Pertinent negatives include normal prostate                            (size, shape, and consistency).  Scattered diverticula were found in the left colon                            and right colon.                           External and internal hemorrhoids were found.                           The exam was otherwise without abnormality on                            direct and retroflexion views.                           Two biopsies were taken every 10 cm with a cold                            forceps from the entire colon for ulcerative                            colitis surveillance. These biopsy specimens were                            sent to Pathology. Verification of patient                            identification for the specimen was done. Estimated                            blood loss was minimal. Complications:            No immediate complications. Estimated Blood Loss:     Estimated blood loss was minimal. Impression:               - Diverticulosis in the left  colon and in the right                            colon.                           - External and internal hemorrhoids.                           - The examination was otherwise normal on direct                            and retroflexion views.                           - Biopsies for surveillance were taken from the                            entire colon.                           -  Personal history of colonic polyps. hx adenoma                            years ago and then small ssp and adenoma 2016 Recommendation:           - Patient has a contact number available for                            emergencies. The signs and symptoms of potential                            delayed complications were discussed with the                            patient. Return to normal activities tomorrow.                            Written discharge instructions were provided to the                            patient.                           - Resume previous diet.                           - Continue present medications.                           - Await pathology results.                           - Repeat colonoscopy is recommended for                            surveillance. The colonoscopy date will be                            determined after pathology results from today's                            exam become available for review. Gatha Mayer, MD 03/13/2022 8:40:05 AM This report has been signed electronically.

## 2022-03-13 NOTE — Progress Notes (Signed)
[facility] History and Physical   Primary Care Physician:  Janith Lima, MD   Reason for Procedure:   Ulcerative colitis  Plan:    colonoscopy     HPI: [name] is a 66 y.o. male here for surveillance colonoscopy with universal UC  Past Medical History:  Diagnosis Date   Alcoholism ([place])    10 yrs mostly sober in [place]    Anemia    Bipolar 2 disorder ([place])    on meds   Cancer ([place])    skin, basal cell   Depression    on meds   Hemorrhoids 11/23/2010   Colonoscopy with [facility]   Hyperlipemia    on meds   Hypertension    on meds   Muscle spasm of back    Personal history of colonic polyps 11/23/2010   5 mm adenoma Colonoscopy with [facility]   Rosacea    Substance abuse ([place])    recovering alcoholic- "in remission"   Ulcerative colitis 11/23/2010   Colonoscopy with [facility]    Past Surgical History:  Procedure Laterality Date   ACHILLES TENDON REPAIR Right 1995   BASAL CELL CARCINOMA EXCISION     COLONOSCOPY  2019   CG-MAC-prep good ([place])-benign colonic mucosas/recall 3 yr   COLONOSCOPY W/ BIOPSIES  2003, 04/2008, 11/2010   2003: proctitis 2009: ulcerative colitis and hemorrhoids 2012: ulcerative colitis and 40m sigmoid  tubular adenoma   KNEE SURGERY Left 2023   MOHS SURGERY  2006   POLYPECTOMY     QUADRICEPS TENDON REPAIR Right 2021   SHOULDER SURGERY Right 2001   TONSILLECTOMY      Prior to Admission medications   Medication Sig Start Date End Date Taking? Authorizing Provider  alfuzosin (UROXATRAL) 10 MG 24 hr tablet TAKE 1 TABLET BY MOUTH DAILY  WITH BREAKFAST 02/15/22  Yes JJanith Lima MD  amoxicillin (AMOXIL) 500 MG capsule Take 500 mg by mouth daily.   Yes [provider]  aspirin EC 81 MG tablet Take 1 tablet (81 mg total) by mouth daily. Swallow whole. 07/04/21  Yes Tolia, Sunit, DO  atorvastatin (LIPITOR) 80 MG tablet Take 1 tablet (80 mg total) by mouth daily. 12/15/21  Yes JJanith Lima MD  DULoxetine (CYMBALTA)  60 MG capsule Take 60 mg by mouth daily.   Yes [provider]  dutasteride (AVODART) 0.5 MG capsule TAKE 1 CAPSULE BY MOUTH DAILY 02/20/22  Yes JJanith Lima MD  Fish Oil-Cholecalciferol (FISH OIL + D3 PO) Take 1 capsule by mouth daily.   Yes [provider]  lamoTRIgine (LAMICTAL) 150 MG tablet Take 150 mg by mouth 2 (two) times daily. 02/11/15  Yes [provider]  mesalamine (LIALDA) 1.2 g EC tablet Take 2 tablets (2.4 g total) by mouth daily. 02/26/22  Yes GGatha Mayer MD  Multiple Vitamin (MULTIVITAMIN) tablet Take 1 tablet by mouth daily.   Yes [provider]  olmesartan (BENICAR) 40 MG tablet Take 1 tablet (40 mg total) by mouth daily. 12/15/21  Yes JJanith Lima MD  QUEtiapine (SEROQUEL) 300 MG tablet Take 300 mg by mouth at bedtime. 12/20/21  Yes [provider]  sodium chloride 1 g tablet Take 1 tablet (1 g total) by mouth 2 (two) times daily with a meal. 11/29/21  Yes JJanith Lima MD  tadalafil (CIALIS) 5 MG tablet Take 1 tablet (5 mg total) by mouth daily. 11/30/21  Yes JJanith Lima MD  fluorouracil (EFUDEX) 5 %  cream Apply topically as needed. Patient not taking: Reported on 02/13/2022    [provider]  QUEtiapine (SEROQUEL) 400 MG tablet Take 400 mg by mouth at bedtime. Patient not taking: Reported on 03/13/2022 02/26/22   [provider]    Current Outpatient Medications  Medication Sig Dispense Refill   alfuzosin (UROXATRAL) 10 MG 24 hr tablet TAKE 1 TABLET BY MOUTH DAILY  WITH BREAKFAST 90 tablet 1   amoxicillin (AMOXIL) 500 MG capsule Take 500 mg by mouth daily.     aspirin EC 81 MG tablet Take 1 tablet (81 mg total) by mouth daily. Swallow whole. 30 tablet 11   atorvastatin (LIPITOR) 80 MG tablet Take 1 tablet (80 mg total) by mouth daily. 90 tablet 1   DULoxetine (CYMBALTA) 60 MG capsule Take 60 mg by mouth daily.     dutasteride (AVODART) 0.5 MG capsule TAKE 1 CAPSULE BY MOUTH DAILY 90 capsule 1    Fish Oil-Cholecalciferol (FISH OIL + D3 PO) Take 1 capsule by mouth daily.     lamoTRIgine (LAMICTAL) 150 MG tablet Take 150 mg by mouth 2 (two) times daily.  1   mesalamine (LIALDA) 1.2 g EC tablet Take 2 tablets (2.4 g total) by mouth daily. 150 tablet 3   Multiple Vitamin (MULTIVITAMIN) tablet Take 1 tablet by mouth daily.     olmesartan (BENICAR) 40 MG tablet Take 1 tablet (40 mg total) by mouth daily. 90 tablet 1   QUEtiapine (SEROQUEL) 300 MG tablet Take 300 mg by mouth at bedtime.     sodium chloride 1 g tablet Take 1 tablet (1 g total) by mouth 2 (two) times daily with a meal. 180 tablet 0   tadalafil (CIALIS) 5 MG tablet Take 1 tablet (5 mg total) by mouth daily. 90 tablet 1   fluorouracil (EFUDEX) 5 % cream Apply topically as needed. (Patient not taking: Reported on 02/13/2022)     QUEtiapine (SEROQUEL) 400 MG tablet Take 400 mg by mouth at bedtime. (Patient not taking: Reported on 03/13/2022)     Current Facility-Administered Medications  Medication Dose Route Frequency Provider Last Rate Last Admin   0.9 %  sodium chloride infusion  500 mL Intravenous Once Gatha Mayer, MD        Allergies as of 03/13/2022   (No Known Allergies)    Family History  Adopted: Yes    Social History   Socioeconomic History   Marital status: Divorced    Spouse name: Not on file   Number of children: 0   Years of education: Not on file   Highest education level: Not on file  Occupational History   Not on file  Tobacco Use   Smoking status: Never   Smokeless tobacco: Never  Vaping Use   Vaping Use: Never used  Substance and Sexual Activity   Alcohol use: Not Currently    Comment: recovering alcoholic-10/2020   Drug use: Never   Sexual activity: Not on file  Other Topics Concern   Not on file  Social History Narrative   1-5 caffeine drinks daily    Exercise 2-3 times a week   Social Determinants of Health   Financial Resource Strain: Not on file  Food Insecurity: Not on file   Transportation Needs: Not on file  Physical Activity: Not on file  Stress: Not on file  Social Connections: Not on file  Intimate Partner Violence: Not on file    Review of Systems:  All other review of systems negative  except as mentioned in the HPI.  Physical Exam: Vital signs BP (!) 148/79   Pulse 68   Temp (!) 96.9 F (36.1 C) (Temporal)   Ht _0  (1.88 m)   Wt 231 lb (104.8 kg)   SpO2 94%   BMI 29.66 kg/m   General:   Alert,  Well-developed, well-nourished, pleasant and cooperative in NAD Lungs:  Clear throughout to auscultation.   Heart:  Regular rate and rhythm; no murmurs, clicks, rubs,  or gallops. Abdomen:  Soft, nontender and nondistended. Normal bowel sounds.   Neuro/Psych:  Alert and cooperative. Normal mood and affect. A and O x 3   _1  E. Carlean Purl, MD, [facility] [phone] (pager) 03/13/2022 7:59 AM@

## 2022-03-14 ENCOUNTER — Telehealth: Payer: Self-pay | Admitting: *Deleted

## 2022-03-14 NOTE — Telephone Encounter (Signed)
  Follow up Call-     03/13/2022    7:11 AM  Call back number  Post procedure Call Back phone  # [phone]  Permission to leave phone message Yes     Patient questions:  Do you have a fever, pain , or abdominal swelling? No. Pain Score  0 *  Have you tolerated food without any problems? Yes.    Have you been able to return to your normal activities? Yes.    Do you have any questions about your discharge instructions: Diet   No. Medications  No. Follow up visit  No.  Do you have questions or concerns about your Care? No.  Actions: * If pain score is 4 or above: No action needed, pain <4.

## 2022-03-15 ENCOUNTER — Encounter: Payer: Self-pay | Admitting: Cardiology

## 2022-03-19 ENCOUNTER — Encounter: Payer: Self-pay | Admitting: Internal Medicine

## 2022-03-19 DIAGNOSIS — L821 Other seborrheic keratosis: Secondary | ICD-10-CM | POA: Diagnosis not present

## 2022-03-19 DIAGNOSIS — C4441 Basal cell carcinoma of skin of scalp and neck: Secondary | ICD-10-CM | POA: Diagnosis not present

## 2022-03-19 DIAGNOSIS — Z85828 Personal history of other malignant neoplasm of skin: Secondary | ICD-10-CM | POA: Diagnosis not present

## 2022-03-19 DIAGNOSIS — L570 Actinic keratosis: Secondary | ICD-10-CM | POA: Diagnosis not present

## 2022-03-20 ENCOUNTER — Other Ambulatory Visit: Payer: Medicare Other

## 2022-03-21 ENCOUNTER — Encounter: Payer: Self-pay | Admitting: Internal Medicine

## 2022-03-27 ENCOUNTER — Other Ambulatory Visit: Payer: Self-pay | Admitting: Internal Medicine

## 2022-03-27 ENCOUNTER — Encounter: Payer: Self-pay | Admitting: Internal Medicine

## 2022-03-27 DIAGNOSIS — N401 Enlarged prostate with lower urinary tract symptoms: Secondary | ICD-10-CM

## 2022-03-27 MED ORDER — DUTASTERIDE 0.5 MG PO CAPS: 0.5000 mg | ORAL_CAPSULE | Freq: Every day | ORAL | 1 refills | Status: DC

## 2022-03-28 DIAGNOSIS — C44519 Basal cell carcinoma of skin of other part of trunk: Secondary | ICD-10-CM | POA: Diagnosis not present

## 2022-05-04 ENCOUNTER — Other Ambulatory Visit: Payer: Self-pay | Admitting: Internal Medicine

## 2022-05-04 DIAGNOSIS — I10 Essential (primary) hypertension: Secondary | ICD-10-CM

## 2022-05-04 DIAGNOSIS — E785 Hyperlipidemia, unspecified: Secondary | ICD-10-CM

## 2022-05-09 ENCOUNTER — Encounter: Payer: Self-pay | Admitting: Internal Medicine

## 2022-05-15 DIAGNOSIS — M25561 Pain in right knee: Secondary | ICD-10-CM | POA: Diagnosis not present

## 2022-05-16 ENCOUNTER — Encounter: Payer: Self-pay | Admitting: Internal Medicine

## 2022-05-20 ENCOUNTER — Encounter: Payer: Self-pay | Admitting: Internal Medicine

## 2022-05-22 ENCOUNTER — Encounter: Payer: Self-pay | Admitting: Internal Medicine

## 2022-05-23 ENCOUNTER — Encounter: Payer: Self-pay | Admitting: Internal Medicine

## 2022-05-24 ENCOUNTER — Ambulatory Visit (INDEPENDENT_AMBULATORY_CARE_PROVIDER_SITE_OTHER): Payer: Medicare Other | Admitting: Internal Medicine

## 2022-05-24 ENCOUNTER — Encounter: Payer: Self-pay | Admitting: Internal Medicine

## 2022-05-24 VITALS — BP 142/80 | HR 95 | Temp 98.4°F | Ht 74.0 in | Wt 229.0 lb

## 2022-05-24 DIAGNOSIS — E785 Hyperlipidemia, unspecified: Secondary | ICD-10-CM | POA: Diagnosis not present

## 2022-05-24 DIAGNOSIS — B351 Tinea unguium: Secondary | ICD-10-CM | POA: Diagnosis not present

## 2022-05-24 DIAGNOSIS — I10 Essential (primary) hypertension: Secondary | ICD-10-CM

## 2022-05-24 DIAGNOSIS — Z0001 Encounter for general adult medical examination with abnormal findings: Secondary | ICD-10-CM

## 2022-05-24 DIAGNOSIS — E871 Hypo-osmolality and hyponatremia: Secondary | ICD-10-CM | POA: Diagnosis not present

## 2022-05-24 DIAGNOSIS — Z23 Encounter for immunization: Secondary | ICD-10-CM | POA: Diagnosis not present

## 2022-05-24 DIAGNOSIS — R7303 Prediabetes: Secondary | ICD-10-CM

## 2022-05-24 DIAGNOSIS — N401 Enlarged prostate with lower urinary tract symptoms: Secondary | ICD-10-CM

## 2022-05-24 DIAGNOSIS — R3911 Hesitancy of micturition: Secondary | ICD-10-CM | POA: Diagnosis not present

## 2022-05-24 MED ORDER — TERBINAFINE HCL 250 MG PO TABS: 250.0000 mg | ORAL_TABLET | Freq: Every day | ORAL | 0 refills | Status: DC

## 2022-05-24 NOTE — Patient Instructions (Signed)
Health Maintenance, Male Adopting a healthy lifestyle and getting preventive care are important in promoting health and wellness. Ask your health care provider about: The right schedule for you to have regular tests and exams. Things you can do on your own to prevent diseases and keep yourself healthy. What should I know about diet, weight, and exercise? Eat a healthy diet  Eat a diet that includes plenty of vegetables, fruits, low-fat dairy products, and lean protein. Do not eat a lot of foods that are high in solid fats, added sugars, or sodium. Maintain a healthy weight Body mass index (BMI) is a measurement that can be used to identify possible weight problems. It estimates body fat based on height and weight. Your health care provider can help determine your BMI and help you achieve or maintain a healthy weight. Get regular exercise Get regular exercise. This is one of the most important things you can do for your health. Most adults should: Exercise for at least 150 minutes each week. The exercise should increase your heart rate and make you sweat (moderate-intensity exercise). Do strengthening exercises at least twice a week. This is in addition to the moderate-intensity exercise. Spend less time sitting. Even light physical activity can be beneficial. Watch cholesterol and blood lipids Have your blood tested for lipids and cholesterol at 66 years of age, then have this test every 5 years. You may need to have your cholesterol levels checked more often if: Your lipid or cholesterol levels are high. You are older than 66 years of age. You are at high risk for heart disease. What should I know about cancer screening? Many types of cancers can be detected early and may often be prevented. Depending on your health history and family history, you may need to have cancer screening at various ages. This may include screening for: Colorectal cancer. Prostate cancer. Skin cancer. Lung  cancer. What should I know about heart disease, diabetes, and high blood pressure? Blood pressure and heart disease High blood pressure causes heart disease and increases the risk of stroke. This is more likely to develop in people who have high blood pressure readings or are overweight. Talk with your health care provider about your target blood pressure readings. Have your blood pressure checked: Every 3-5 years if you are 18-39 years of age. Every year if you are 40 years old or older. If you are between the ages of 65 and 75 and are a current or former smoker, ask your health care provider if you should have a one-time screening for abdominal aortic aneurysm (AAA). Diabetes Have regular diabetes screenings. This checks your fasting blood sugar level. Have the screening done: Once every three years after age 45 if you are at a normal weight and have a low risk for diabetes. More often and at a younger age if you are overweight or have a high risk for diabetes. What should I know about preventing infection? Hepatitis B If you have a higher risk for hepatitis B, you should be screened for this virus. Talk with your health care provider to find out if you are at risk for hepatitis B infection. Hepatitis C Blood testing is recommended for: Everyone born from 1945 through 1965. Anyone with known risk factors for hepatitis C. Sexually transmitted infections (STIs) You should be screened each year for STIs, including gonorrhea and chlamydia, if: You are sexually active and are younger than 66 years of age. You are older than 66 years of age and your   health care provider tells you that you are at risk for this type of infection. Your sexual activity has changed since you were last screened, and you are at increased risk for chlamydia or gonorrhea. Ask your health care provider if you are at risk. Ask your health care provider about whether you are at high risk for HIV. Your health care provider  may recommend a prescription medicine to help prevent HIV infection. If you choose to take medicine to prevent HIV, you should first get tested for HIV. You should then be tested every 3 months for as long as you are taking the medicine. Follow these instructions at home: Alcohol use Do not drink alcohol if your health care provider tells you not to drink. If you drink alcohol: Limit how much you have to 0-2 drinks a day. Know how much alcohol is in your drink. In the [place] one drink equals one 12 oz bottle of beer (355 mL), one 5 oz glass of wine (148 mL), or one 1 oz glass of hard liquor (44 mL). Lifestyle Do not use any products that contain nicotine or tobacco. These products include cigarettes, chewing tobacco, and vaping devices, such as e-cigarettes. If you need help quitting, ask your health care provider. Do not use street drugs. Do not share needles. Ask your health care provider for help if you need support or information about quitting drugs. General instructions Schedule regular health, dental, and eye exams. Stay current with your vaccines. Tell your health care provider if: You often feel depressed. You have ever been abused or do not feel safe at home. Summary Adopting a healthy lifestyle and getting preventive care are important in promoting health and wellness. Follow your health care provider's instructions about healthy diet, exercising, and getting tested or screened for diseases. Follow your health care provider's instructions on monitoring your cholesterol and blood pressure. This information is not intended to replace advice given to you by your health care provider. Make sure you discuss any questions you have with your health care provider. Document Revised: 01/09/2021 Document Reviewed: 01/09/2021 Elsevier Patient Education  2023 Elsevier Inc.  

## 2022-05-24 NOTE — Progress Notes (Signed)
Subjective:  Patient ID: [name], male    DOB: [birth year]  Age: 66 y.o. MRN: [id number]  CC: Annual Exam, Hypertension, and Hyperlipidemia   HPI [name] presents for a CPX and f/up -  He walks 6 minutes a day on the treadmill.  His endurance is good and he denies chest pain, shortness of breath, diaphoresis, or edema.  He is concerned about his toenails.  Outpatient Medications Prior to Visit  Medication Sig Dispense Refill   alfuzosin (UROXATRAL) 10 MG 24 hr tablet TAKE 1 TABLET BY MOUTH DAILY  WITH BREAKFAST 90 tablet 1   amoxicillin (AMOXIL) 500 MG capsule Take 500 mg by mouth daily.     aspirin EC 81 MG tablet Take 1 tablet (81 mg total) by mouth daily. Swallow whole. 30 tablet 11   atorvastatin (LIPITOR) 80 MG tablet TAKE 1 TABLET BY MOUTH DAILY 90 tablet 0   DULoxetine (CYMBALTA) 60 MG capsule Take 60 mg by mouth daily.     dutasteride (AVODART) 0.5 MG capsule Take 1 capsule (0.5 mg total) by mouth daily. 90 capsule 1   Fish Oil-Cholecalciferol (FISH OIL + D3 PO) Take 1 capsule by mouth daily.     fluorouracil (EFUDEX) 5 % cream Apply topically as needed.     lamoTRIgine (LAMICTAL) 150 MG tablet Take 150 mg by mouth 2 (two) times daily.  1   mesalamine (LIALDA) 1.2 g EC tablet Take 2 tablets (2.4 g total) by mouth daily. 150 tablet 3   Multiple Vitamin (MULTIVITAMIN) tablet Take 1 tablet by mouth daily.     olmesartan (BENICAR) 40 MG tablet TAKE 1 TABLET BY MOUTH DAILY 90 tablet 0   QUEtiapine (SEROQUEL) 300 MG tablet Take 300 mg by mouth at bedtime.     QUEtiapine (SEROQUEL) 400 MG tablet Take 400 mg by mouth at bedtime.     sodium chloride 1 g tablet Take 1 tablet (1 g total) by mouth 2 (two) times daily with a meal. 180 tablet 0   tadalafil (CIALIS) 5 MG tablet Take 1 tablet (5 mg total) by mouth daily. 90 tablet 1   No facility-administered medications prior to visit.    ROS Review of Systems  Constitutional: Negative.  Negative for diaphoresis,  fatigue and unexpected weight change.  HENT: Negative.    Eyes: Negative.   Respiratory:  Negative for cough, chest tightness, shortness of breath and wheezing.   Cardiovascular:  Negative for chest pain, palpitations and leg swelling.  Gastrointestinal:  Negative for abdominal pain, constipation, diarrhea, nausea and vomiting.  Endocrine: Negative.   Genitourinary:  Positive for difficulty urinating. Negative for dysuria.       He complains of nocturia and weak urine stream.  Musculoskeletal:  Negative for arthralgias and myalgias.  Skin: Negative.  Negative for color change and pallor.  Neurological:  Negative for dizziness, weakness, light-headedness and headaches.  Hematological:  Negative for adenopathy. Does not bruise/bleed easily.  Psychiatric/Behavioral: Negative.      Objective:  BP (!) 142/80 (BP Location: Right Arm, Patient Position: Sitting, Cuff Size: Normal)   Pulse 95   Temp 98.4 F (36.9 C) (Oral)   Ht 6' 2"  (1.88 m)   Wt 229 lb (103.9 kg)   SpO2 96%   BMI 29.40 kg/m   BP Readings from Last 3 Encounters:  05/24/22 (!) 142/80  03/13/22 130/88  11/28/21 128/74    Wt Readings from Last 3 Encounters:  05/24/22 229 lb (103.9 kg)  03/13/22 231  lb (104.8 kg)  02/13/22 231 lb (104.8 kg)    Physical Exam Vitals reviewed.  Constitutional:      Appearance: He is not ill-appearing.  HENT:     Nose: Nose normal.     Mouth/Throat:     Mouth: Mucous membranes are moist.  Eyes:     General: No scleral icterus.    Conjunctiva/sclera: Conjunctivae normal.  Cardiovascular:     Rate and Rhythm: Normal rate and regular rhythm.     Heart sounds: No murmur heard. Pulmonary:     Effort: Pulmonary effort is normal.     Breath sounds: No stridor. No wheezing, rhonchi or rales.  Abdominal:     General: Abdomen is flat.     Palpations: There is no mass.     Tenderness: There is no abdominal tenderness. There is no guarding.     Hernia: No hernia is present.   Musculoskeletal:        General: Normal range of motion.     Cervical back: Neck supple.     Right lower leg: No edema.     Left lower leg: No edema.  Lymphadenopathy:     Cervical: No cervical adenopathy.  Skin:    General: Skin is warm and dry.     Coloration: Skin is not pale.  Neurological:     General: No focal deficit present.     Mental Status: He is alert. Mental status is at baseline.  Psychiatric:        Mood and Affect: Mood normal.        Behavior: Behavior normal.     Lab Results  Component Value Date   WBC 6.5 11/28/2021   HGB 13.2 11/28/2021   HCT 38.0 (L) 11/28/2021   PLT 234.0 11/28/2021   GLUCOSE 108 (H) 11/28/2021   CHOL 132 02/21/2021   TRIG 61.0 02/21/2021   HDL 53.20 02/21/2021   LDLDIRECT 102.0 09/09/2017   LDLCALC 67 02/21/2021   ALT 31 02/21/2021   AST 36 02/21/2021   NA 128 (L) 11/28/2021   K 4.3 11/28/2021   CL 96 11/28/2021   CREATININE 0.89 11/28/2021   BUN 15 11/28/2021   CO2 22 11/28/2021   TSH 0.79 02/03/2020   PSA 2.08 11/28/2021   INR 0.9 03/08/2008   HGBA1C 6.1 11/28/2021    CT CARDIAC SCORING ([facility] LOCATIONS ONLY)  Result Date: 06/14/2021 CLINICAL DATA:  65 year old Caucasian male with history of hyperlipidemia and hypertension. EXAM: CT CARDIAC CORONARY ARTERY CALCIUM SCORE TECHNIQUE: Non-contrast imaging through the heart was performed using prospective ECG gating. Image post processing was performed on an independent workstation, allowing for quantitative analysis of the heart and coronary arteries. Note that this exam targets the heart and the chest was not imaged in its entirety. COMPARISON:  None. FINDINGS: CORONARY CALCIUM SCORES: Left Main: 29.4 LAD: 407 LCx: 0 RCA: 0.4 Total Agatston Score: 437 MESA database percentile: 81 AORTA MEASUREMENTS: Ascending Aorta: 35 mm Descending Aorta: 25 mm OTHER FINDINGS: The heart size is within normal limits. No pericardial fluid is identified. Visualized segments of the thoracic aorta and  central pulmonary arteries are normal in caliber. Visualized mediastinum and hilar regions demonstrate no lymphadenopathy or masses. Visualized lungs show no evidence of pulmonary edema, consolidation, pneumothorax, nodule or pleural fluid. Visualized upper abdomen and bony structures are unremarkable. IMPRESSION: Coronary calcium score of 437 is at the 81st percentile for the patient's age, sex and race. Electronically Signed   By: [place]ness [place].D.  On: 06/14/2021 16:14    Assessment & Plan:   [name] was seen today for annual exam, hypertension and hyperlipidemia.  Diagnoses and all orders for this visit:  Essential hypertension- His blood pressure is adequately well controlled. -     CBC with Differential/Platelet; Future -     Hepatic function panel; Future -     Urinalysis, Routine w reflex microscopic; Future  Prediabetes- I will monitor his A1c. -     Hemoglobin A1c; Future  Dyslipidemia, goal LDL below 100 -     Lipid panel; Future -     Hepatic function panel; Future  Chronic hyponatremia -     Basic metabolic panel; Future  Flu vaccine need -     Flu Vaccine QUAD High Dose(Fluad)  Onychomycosis of toenail- Will check LFTs and if they are normal will start terbinafine. -     terbinafine (LAMISIL) 250 MG tablet; Take 1 tablet (250 mg total) by mouth daily.  Benign prostatic hyperplasia with urinary hesitancy -     Ambulatory referral to Urology  Encounter for general adult medical examination with abnormal findings- Exam completed, labs reviewed, vaccines are up-to-date, cancer screenings are up-to-date, patient education was given.   I am having [name] start on terbinafine. I am also having him maintain his DULoxetine, lamoTRIgine, multivitamin, fluorouracil, amoxicillin, Fish Oil-Cholecalciferol (FISH OIL + D3 PO), aspirin EC, sodium chloride, tadalafil, QUEtiapine, alfuzosin, mesalamine, QUEtiapine, dutasteride, atorvastatin, and olmesartan.  Meds ordered  this encounter  Medications   terbinafine (LAMISIL) 250 MG tablet    Sig: Take 1 tablet (250 mg total) by mouth daily.    Dispense:  90 tablet    Refill:  0     Follow-up: Return in about 3 months (around 08/23/2022).  [name], MD

## 2022-05-26 ENCOUNTER — Encounter: Payer: Self-pay | Admitting: Internal Medicine

## 2022-05-26 DIAGNOSIS — Z0001 Encounter for general adult medical examination with abnormal findings: Secondary | ICD-10-CM | POA: Insufficient documentation

## 2022-05-26 DIAGNOSIS — Z23 Encounter for immunization: Secondary | ICD-10-CM | POA: Insufficient documentation

## 2022-06-05 ENCOUNTER — Encounter: Payer: Self-pay | Admitting: Internal Medicine

## 2022-06-13 ENCOUNTER — Other Ambulatory Visit: Payer: Self-pay | Admitting: Internal Medicine

## 2022-06-13 ENCOUNTER — Encounter: Payer: Self-pay | Admitting: Internal Medicine

## 2022-06-13 ENCOUNTER — Other Ambulatory Visit (INDEPENDENT_AMBULATORY_CARE_PROVIDER_SITE_OTHER): Payer: Medicare Other

## 2022-06-13 DIAGNOSIS — I10 Essential (primary) hypertension: Secondary | ICD-10-CM

## 2022-06-13 DIAGNOSIS — E785 Hyperlipidemia, unspecified: Secondary | ICD-10-CM | POA: Diagnosis not present

## 2022-06-13 DIAGNOSIS — E871 Hypo-osmolality and hyponatremia: Secondary | ICD-10-CM | POA: Diagnosis not present

## 2022-06-13 DIAGNOSIS — R7303 Prediabetes: Secondary | ICD-10-CM | POA: Diagnosis not present

## 2022-06-13 DIAGNOSIS — B351 Tinea unguium: Secondary | ICD-10-CM

## 2022-06-13 LAB — URINALYSIS, ROUTINE W REFLEX MICROSCOPIC
Bilirubin Urine: NEGATIVE
Hgb urine dipstick: NEGATIVE
Ketones, ur: NEGATIVE
Leukocytes,Ua: NEGATIVE
Nitrite: NEGATIVE
Specific Gravity, Urine: 1.02 (ref 1.000–1.030)
Total Protein, Urine: NEGATIVE
Urine Glucose: NEGATIVE
Urobilinogen, UA: 0.2 (ref 0.0–1.0)
pH: 6 (ref 5.0–8.0)

## 2022-06-13 LAB — LIPID PANEL
Cholesterol: 140 mg/dL (ref 0–200)
HDL: 54.8 mg/dL (ref >39.00)
LDL Cholesterol: 62 mg/dL (ref 0–99)
NonHDL: 85.5
Total CHOL/HDL Ratio: 3
Triglycerides: 117 mg/dL (ref 0.0–149.0)
VLDL: 23.4 mg/dL (ref 0.0–40.0)

## 2022-06-13 LAB — HEMOGLOBIN A1C: Hgb A1c MFr Bld: 6.1 % (ref 4.6–6.5)

## 2022-06-13 LAB — HEPATIC FUNCTION PANEL
ALT: 19 U/L (ref 0–53)
AST: 21 U/L (ref 0–37)
Albumin: 4.6 g/dL (ref 3.5–5.2)
Alkaline Phosphatase: 68 U/L (ref 39–117)
Bilirubin, Direct: 0.2 mg/dL (ref 0.0–0.3)
Total Bilirubin: 0.9 mg/dL (ref 0.2–1.2)
Total Protein: 7.4 g/dL (ref 6.0–8.3)

## 2022-06-13 LAB — CBC WITH DIFFERENTIAL/PLATELET
Basophils Absolute: 0 10*3/uL (ref 0.0–0.1)
Basophils Relative: 0.6 % (ref 0.0–3.0)
Eosinophils Absolute: 0 10*3/uL (ref 0.0–0.7)
Eosinophils Relative: 0.8 % (ref 0.0–5.0)
HCT: 40.5 % (ref 39.0–52.0)
Hemoglobin: 13.9 g/dL (ref 13.0–17.0)
Lymphocytes Relative: 13.7 % (ref 12.0–46.0)
Lymphs Abs: 0.8 10*3/uL (ref 0.7–4.0)
MCHC: 34.3 g/dL (ref 30.0–36.0)
MCV: 94.1 fl (ref 78.0–100.0)
Monocytes Absolute: 0.6 10*3/uL (ref 0.1–1.0)
Monocytes Relative: 10 % (ref 3.0–12.0)
Neutro Abs: 4.2 10*3/uL (ref 1.4–7.7)
Neutrophils Relative %: 74.9 % (ref 43.0–77.0)
Platelets: 213 10*3/uL (ref 150.0–400.0)
RBC: 4.3 Mil/uL (ref 4.22–5.81)
RDW: 12.4 % (ref 11.5–15.5)
WBC: 5.6 10*3/uL (ref 4.0–10.5)

## 2022-06-13 LAB — BASIC METABOLIC PANEL
BUN: 14 mg/dL (ref 6–23)
CO2: 26 mEq/L (ref 19–32)
Calcium: 9.7 mg/dL (ref 8.4–10.5)
Chloride: 96 mEq/L (ref 96–112)
Creatinine, Ser: 0.82 mg/dL (ref 0.40–1.50)
GFR: 91.65 mL/min (ref >60.00)
Glucose, Bld: 104 mg/dL — ABNORMAL HIGH (ref 70–99)
Potassium: 4 mEq/L (ref 3.5–5.1)
Sodium: 130 mEq/L — ABNORMAL LOW (ref 135–145)

## 2022-06-13 MED ORDER — TERBINAFINE HCL 250 MG PO TABS: 250.0000 mg | ORAL_TABLET | Freq: Every day | ORAL | 0 refills | Status: DC

## 2022-07-01 ENCOUNTER — Encounter: Payer: Self-pay | Admitting: Internal Medicine

## 2022-07-02 ENCOUNTER — Other Ambulatory Visit: Payer: Self-pay | Admitting: Internal Medicine

## 2022-07-02 DIAGNOSIS — N401 Enlarged prostate with lower urinary tract symptoms: Secondary | ICD-10-CM

## 2022-07-02 MED ORDER — TADALAFIL 5 MG PO TABS: 5.0000 mg | ORAL_TABLET | Freq: Every day | ORAL | 1 refills | Status: DC

## 2022-07-06 ENCOUNTER — Other Ambulatory Visit: Payer: Self-pay | Admitting: Internal Medicine

## 2022-07-06 DIAGNOSIS — I10 Essential (primary) hypertension: Secondary | ICD-10-CM

## 2022-07-23 ENCOUNTER — Other Ambulatory Visit: Payer: Self-pay | Admitting: Internal Medicine

## 2022-07-23 DIAGNOSIS — E785 Hyperlipidemia, unspecified: Secondary | ICD-10-CM

## 2022-07-25 ENCOUNTER — Encounter: Payer: Self-pay | Admitting: Internal Medicine

## 2022-08-09 ENCOUNTER — Encounter: Payer: Self-pay | Admitting: Internal Medicine

## 2022-08-09 DIAGNOSIS — N401 Enlarged prostate with lower urinary tract symptoms: Secondary | ICD-10-CM

## 2022-08-09 MED ORDER — TADALAFIL 5 MG PO TABS: 5.0000 mg | ORAL_TABLET | Freq: Every day | ORAL | 1 refills | Status: DC

## 2022-08-17 DIAGNOSIS — R3912 Poor urinary stream: Secondary | ICD-10-CM | POA: Diagnosis not present

## 2022-08-18 ENCOUNTER — Encounter: Payer: Self-pay | Admitting: Internal Medicine

## 2022-08-21 ENCOUNTER — Encounter: Payer: Self-pay | Admitting: Internal Medicine

## 2022-08-22 ENCOUNTER — Encounter: Payer: Self-pay | Admitting: Internal Medicine

## 2022-08-23 ENCOUNTER — Ambulatory Visit (INDEPENDENT_AMBULATORY_CARE_PROVIDER_SITE_OTHER): Payer: Medicare Other | Admitting: Internal Medicine

## 2022-08-23 ENCOUNTER — Ambulatory Visit (INDEPENDENT_AMBULATORY_CARE_PROVIDER_SITE_OTHER): Payer: Medicare Other

## 2022-08-23 ENCOUNTER — Encounter: Payer: Self-pay | Admitting: Internal Medicine

## 2022-08-23 VITALS — BP 136/82 | HR 65 | Temp 98.1°F | Resp 16 | Ht 74.0 in | Wt 226.8 lb

## 2022-08-23 DIAGNOSIS — I10 Essential (primary) hypertension: Secondary | ICD-10-CM

## 2022-08-23 DIAGNOSIS — R052 Subacute cough: Secondary | ICD-10-CM | POA: Diagnosis not present

## 2022-08-23 DIAGNOSIS — E871 Hypo-osmolality and hyponatremia: Secondary | ICD-10-CM | POA: Diagnosis not present

## 2022-08-23 DIAGNOSIS — R059 Cough, unspecified: Secondary | ICD-10-CM | POA: Diagnosis not present

## 2022-08-23 LAB — BASIC METABOLIC PANEL
BUN: 14 mg/dL (ref 6–23)
CO2: 29 mEq/L (ref 19–32)
Calcium: 9.5 mg/dL (ref 8.4–10.5)
Chloride: 97 mEq/L (ref 96–112)
Creatinine, Ser: 0.82 mg/dL (ref 0.40–1.50)
GFR: 91.53 mL/min (ref >60.00)
Glucose, Bld: 101 mg/dL — ABNORMAL HIGH (ref 70–99)
Potassium: 4.4 mEq/L (ref 3.5–5.1)
Sodium: 132 mEq/L — ABNORMAL LOW (ref 135–145)

## 2022-08-23 LAB — TSH: TSH: 0.96 u[IU]/mL (ref 0.35–5.50)

## 2022-08-23 NOTE — Patient Instructions (Signed)

## 2022-08-23 NOTE — Progress Notes (Signed)
Subjective:  Patient ID: [name], male    DOB: 25-Jun-1956  Age: 66 y.o. MRN: [id number]  CC: Cough and Hypertension   HPI [name] presents for f/up -    He complains of a 10-day history of nonproductive cough.  He is taking amoxicillin for a dental purpose.  He denies fever, chills, night sweats, chest pain, shortness of breath, or wheezing.  He does 10 minutes of cardio a day and denies dyspnea on exertion or chest pain.  Outpatient Medications Prior to Visit  Medication Sig Dispense Refill   amoxicillin (AMOXIL) 500 MG capsule Take 500 mg by mouth daily.     aspirin EC 81 MG tablet Take 1 tablet (81 mg total) by mouth daily. Swallow whole. 30 tablet 11   atorvastatin (LIPITOR) 80 MG tablet TAKE 1 TABLET BY MOUTH ONCE  DAILY 90 tablet 0   DULoxetine (CYMBALTA) 60 MG capsule Take 60 mg by mouth daily.     dutasteride (AVODART) 0.5 MG capsule Take 1 capsule (0.5 mg total) by mouth daily. 90 capsule 1   Fish Oil-Cholecalciferol (FISH OIL + D3 PO) Take 1 capsule by mouth daily.     fluorouracil (EFUDEX) 5 % cream Apply topically as needed.     hydrocortisone 2.5 % cream Apply 1 Application topically 2 (two) times daily.     lamoTRIgine (LAMICTAL) 150 MG tablet Take 150 mg by mouth 2 (two) times daily.  1   mesalamine (LIALDA) 1.2 g EC tablet Take 2 tablets (2.4 g total) by mouth daily. 150 tablet 3   Multiple Vitamin (MULTIVITAMIN) tablet Take 1 tablet by mouth daily.     olmesartan (BENICAR) 40 MG tablet TAKE 1 TABLET BY MOUTH DAILY 90 tablet 1   QUEtiapine (SEROQUEL) 300 MG tablet Take 300 mg by mouth at bedtime.     silodosin (RAPAFLO) 8 MG CAPS capsule Take 8 mg by mouth daily.     sodium chloride 1 g tablet Take 1 tablet (1 g total) by mouth 2 (two) times daily with a meal. 180 tablet 0   tadalafil (CIALIS) 5 MG tablet Take 1 tablet (5 mg total) by mouth daily. 90 tablet 1   terbinafine (LAMISIL) 250 MG tablet Take 1 tablet (250 mg total) by mouth daily. 90 tablet 0    QUEtiapine (SEROQUEL) 400 MG tablet Take 400 mg by mouth at bedtime. (Patient not taking: Reported on 08/23/2022)     alfuzosin (UROXATRAL) 10 MG 24 hr tablet TAKE 1 TABLET BY MOUTH DAILY  WITH BREAKFAST (Patient not taking: Reported on 08/23/2022) 90 tablet 1   No facility-administered medications prior to visit.    ROS Review of Systems  Constitutional:  Positive for fatigue. Negative for appetite change, chills, diaphoresis, fever and unexpected weight change.  HENT: Negative.    Eyes: Negative.   Respiratory:  Positive for cough. Negative for chest tightness, shortness of breath and wheezing.   Cardiovascular:  Negative for chest pain, palpitations and leg swelling.  Gastrointestinal:  Negative for abdominal pain, diarrhea, nausea and vomiting.  Endocrine: Negative.   Genitourinary: Negative.  Negative for difficulty urinating.  Musculoskeletal: Negative.   Skin: Negative.  Negative for rash.  Neurological: Negative.  Negative for dizziness, weakness and light-headedness.  Hematological:  Negative for adenopathy. Does not bruise/bleed easily.  Psychiatric/Behavioral: Negative.  Negative for decreased concentration, dysphoric mood, sleep disturbance and suicidal ideas. The patient is not nervous/anxious.     Objective:  BP 136/82 (BP Location: Left Arm, Patient Position: Sitting,  Cuff Size: Normal)   Pulse 65   Temp 98.1 F (36.7 C) (Oral)   Resp 16   Ht 6' 2"  (1.88 m)   Wt 226 lb 12.8 oz (102.9 kg)   SpO2 96%   BMI 29.12 kg/m   BP Readings from Last 3 Encounters:  08/23/22 136/82  05/24/22 (!) 142/80  03/13/22 130/88    Wt Readings from Last 3 Encounters:  08/23/22 226 lb 12.8 oz (102.9 kg)  05/24/22 229 lb (103.9 kg)  03/13/22 231 lb (104.8 kg)    Physical Exam Vitals reviewed.  HENT:     Mouth/Throat:     Mouth: Mucous membranes are moist.  Eyes:     General: No scleral icterus.    Conjunctiva/sclera: Conjunctivae normal.  Cardiovascular:     Rate and  Rhythm: Normal rate and regular rhythm.     Heart sounds: No murmur heard.    No friction rub.  Pulmonary:     Breath sounds: No stridor. No wheezing, rhonchi or rales.  Abdominal:     General: Abdomen is flat.     Palpations: There is no mass.     Tenderness: There is no abdominal tenderness. There is no guarding.     Hernia: No hernia is present.  Musculoskeletal:        General: Normal range of motion.     Cervical back: Neck supple.     Right lower leg: No edema.     Left lower leg: No edema.  Lymphadenopathy:     Cervical: No cervical adenopathy.  Skin:    General: Skin is warm and dry.  Neurological:     General: No focal deficit present.     Mental Status: He is alert.  Psychiatric:        Mood and Affect: Mood normal.        Behavior: Behavior normal.     Lab Results  Component Value Date   WBC 5.6 06/13/2022   HGB 13.9 06/13/2022   HCT 40.5 06/13/2022   PLT 213.0 06/13/2022   GLUCOSE 101 (H) 08/23/2022   CHOL 140 06/13/2022   TRIG 117.0 06/13/2022   HDL 54.80 06/13/2022   LDLDIRECT 102.0 09/09/2017   LDLCALC 62 06/13/2022   ALT 19 06/13/2022   AST 21 06/13/2022   NA 132 (L) 08/23/2022   K 4.4 08/23/2022   CL 97 08/23/2022   CREATININE 0.82 08/23/2022   BUN 14 08/23/2022   CO2 29 08/23/2022   TSH 0.96 08/23/2022   PSA 2.08 11/28/2021   INR 0.9 03/08/2008   HGBA1C 6.1 06/13/2022    CT CARDIAC SCORING ([facility] LOCATIONS ONLY)  Result Date: 06/14/2021 CLINICAL DATA:  66 year old Caucasian male with history of hyperlipidemia and hypertension. EXAM: CT CARDIAC CORONARY ARTERY CALCIUM SCORE TECHNIQUE: Non-contrast imaging through the heart was performed using prospective ECG gating. Image post processing was performed on an independent workstation, allowing for quantitative analysis of the heart and coronary arteries. Note that this exam targets the heart and the chest was not imaged in its entirety. COMPARISON:  None. FINDINGS: CORONARY CALCIUM SCORES: Left  Main: 29.4 LAD: 407 LCx: 0 RCA: 0.4 Total Agatston Score: 437 MESA database percentile: 81 AORTA MEASUREMENTS: Ascending Aorta: 35 mm Descending Aorta: 25 mm OTHER FINDINGS: The heart size is within normal limits. No pericardial fluid is identified. Visualized segments of the thoracic aorta and central pulmonary arteries are normal in caliber. Visualized mediastinum and hilar regions demonstrate no lymphadenopathy or masses. Visualized lungs show no  evidence of pulmonary edema, consolidation, pneumothorax, nodule or pleural fluid. Visualized upper abdomen and bony structures are unremarkable. IMPRESSION: Coronary calcium score of 437 is at the 81st percentile for the patient's age, sex and race. Electronically Signed   By: Aletta Edouard M.D.   On: 06/14/2021 16:14   DG Chest 2 View  Result Date: 08/23/2022 CLINICAL DATA:  Cough for 10 days EXAM: CHEST - 2 VIEW COMPARISON:  09/21/2013 FINDINGS: The heart size and mediastinal contours are within normal limits. Bandlike scarring or atelectasis of the anterior right middle lobe and left lung base. The visualized skeletal structures are unremarkable. IMPRESSION: Bandlike scarring or atelectasis of the anterior right middle lobe and left lung base. No acute appearing airspace opacity. Electronically Signed   By: Delanna Ahmadi M.D.   On: 08/23/2022 08:58      Assessment & Plan:   [name] was seen today for cough and hypertension.  Diagnoses and all orders for this visit:  Subacute cough- His chest x-ray is negative for mass or infiltrate.  I think he has a viral URI. -     DG Chest 2 View; Future  Essential hypertension- His blood pressure is adequately well-controlled. -     Basic metabolic panel; Future -     TSH; Future -     TSH -     Basic metabolic panel  Chronic hyponatremia- His sodium is stable and he is asymptomatic. -     Basic metabolic panel; Future -     Basic metabolic panel   I have discontinued [name]'s alfuzosin. I am  also having him maintain his DULoxetine, lamoTRIgine, multivitamin, fluorouracil, amoxicillin, Fish Oil-Cholecalciferol (FISH OIL + D3 PO), aspirin EC, sodium chloride, QUEtiapine, mesalamine, QUEtiapine, dutasteride, terbinafine, olmesartan, atorvastatin, tadalafil, hydrocortisone, and silodosin.  No orders of the defined types were placed in this encounter.    Follow-up: Return if symptoms worsen or fail to improve.  [name], MD

## 2022-08-28 ENCOUNTER — Encounter: Payer: Self-pay | Admitting: Internal Medicine

## 2022-08-30 ENCOUNTER — Encounter: Payer: Self-pay | Admitting: Internal Medicine

## 2022-08-30 ENCOUNTER — Other Ambulatory Visit: Payer: Self-pay | Admitting: Internal Medicine

## 2022-09-11 ENCOUNTER — Encounter: Payer: Self-pay | Admitting: Internal Medicine

## 2022-09-11 DIAGNOSIS — H2513 Age-related nuclear cataract, bilateral: Secondary | ICD-10-CM | POA: Diagnosis not present

## 2022-09-19 DIAGNOSIS — L821 Other seborrheic keratosis: Secondary | ICD-10-CM | POA: Diagnosis not present

## 2022-09-19 DIAGNOSIS — Z85828 Personal history of other malignant neoplasm of skin: Secondary | ICD-10-CM | POA: Diagnosis not present

## 2022-09-19 DIAGNOSIS — L812 Freckles: Secondary | ICD-10-CM | POA: Diagnosis not present

## 2022-09-19 DIAGNOSIS — C44629 Squamous cell carcinoma of skin of left upper limb, including shoulder: Secondary | ICD-10-CM | POA: Diagnosis not present

## 2022-09-19 DIAGNOSIS — C4441 Basal cell carcinoma of skin of scalp and neck: Secondary | ICD-10-CM | POA: Diagnosis not present

## 2022-09-20 ENCOUNTER — Other Ambulatory Visit: Payer: Self-pay

## 2022-09-20 ENCOUNTER — Encounter: Payer: Self-pay | Admitting: Internal Medicine

## 2022-09-20 DIAGNOSIS — K5100 Ulcerative (chronic) pancolitis without complications: Secondary | ICD-10-CM

## 2022-09-20 DIAGNOSIS — K51019 Ulcerative (chronic) pancolitis with unspecified complications: Secondary | ICD-10-CM

## 2022-09-20 MED ORDER — MESALAMINE 1.2 G PO TBEC: 2.4000 g | DELAYED_RELEASE_TABLET | Freq: Every day | ORAL | 3 refills | Status: DC

## 2022-09-23 ENCOUNTER — Other Ambulatory Visit: Payer: Self-pay | Admitting: Internal Medicine

## 2022-09-23 DIAGNOSIS — N401 Enlarged prostate with lower urinary tract symptoms: Secondary | ICD-10-CM

## 2022-09-23 DIAGNOSIS — E785 Hyperlipidemia, unspecified: Secondary | ICD-10-CM

## 2022-09-24 ENCOUNTER — Other Ambulatory Visit: Payer: Self-pay | Admitting: Internal Medicine

## 2022-09-24 DIAGNOSIS — N401 Enlarged prostate with lower urinary tract symptoms: Secondary | ICD-10-CM

## 2022-09-24 DIAGNOSIS — R3912 Poor urinary stream: Secondary | ICD-10-CM | POA: Diagnosis not present

## 2022-09-24 DIAGNOSIS — E785 Hyperlipidemia, unspecified: Secondary | ICD-10-CM

## 2022-09-24 MED ORDER — DUTASTERIDE 0.5 MG PO CAPS: 0.5000 mg | ORAL_CAPSULE | Freq: Every day | ORAL | 1 refills | Status: DC

## 2022-09-24 MED ORDER — ATORVASTATIN CALCIUM 80 MG PO TABS: 80.0000 mg | ORAL_TABLET | Freq: Every day | ORAL | 1 refills | Status: DC

## 2022-09-25 ENCOUNTER — Encounter: Payer: Self-pay | Admitting: Internal Medicine

## 2022-10-06 ENCOUNTER — Encounter: Payer: Self-pay | Admitting: Internal Medicine

## 2022-10-07 ENCOUNTER — Encounter: Payer: Self-pay | Admitting: Internal Medicine

## 2022-10-11 ENCOUNTER — Ambulatory Visit: Payer: Medicare Other | Admitting: Cardiology

## 2022-10-16 NOTE — Telephone Encounter (Signed)
I have routed the referral to that [name] is aware via phone and mychart letter. [name]

## 2022-10-22 ENCOUNTER — Ambulatory Visit: Payer: Medicare Other | Admitting: Cardiology

## 2022-10-24 ENCOUNTER — Telehealth: Payer: Self-pay

## 2022-10-24 NOTE — Telephone Encounter (Signed)
Called patient to schedule Medicare Annual Wellness Visit (AWV). Left message for patient to call back and schedule Medicare Annual Wellness Visit (AWV).  Last date of AWV: NO HX OF AWV, eligible 02/01/22  Please schedule an appointment at any time with [facility].  If any questions, please contact me.  Thank you ,  Norton Blizzard, [place] ([place])  [place] [facility] [phone]

## 2022-11-04 ENCOUNTER — Encounter: Payer: Self-pay | Admitting: Internal Medicine

## 2022-11-04 DIAGNOSIS — N401 Enlarged prostate with lower urinary tract symptoms: Secondary | ICD-10-CM

## 2022-11-04 DIAGNOSIS — E871 Hypo-osmolality and hyponatremia: Secondary | ICD-10-CM

## 2022-11-04 DIAGNOSIS — E785 Hyperlipidemia, unspecified: Secondary | ICD-10-CM

## 2022-11-04 DIAGNOSIS — I10 Essential (primary) hypertension: Secondary | ICD-10-CM

## 2022-11-05 ENCOUNTER — Other Ambulatory Visit: Payer: Self-pay

## 2022-11-05 DIAGNOSIS — K51019 Ulcerative (chronic) pancolitis with unspecified complications: Secondary | ICD-10-CM

## 2022-11-05 MED ORDER — DUTASTERIDE 0.5 MG PO CAPS: 0.5000 mg | ORAL_CAPSULE | Freq: Every day | ORAL | 1 refills | Status: DC

## 2022-11-05 MED ORDER — OLMESARTAN MEDOXOMIL 40 MG PO TABS: 40.0000 mg | ORAL_TABLET | Freq: Every day | ORAL | 1 refills | Status: DC

## 2022-11-05 MED ORDER — SODIUM CHLORIDE 1 G PO TABS: 1.0000 g | ORAL_TABLET | Freq: Two times a day (BID) | ORAL | 0 refills | Status: DC

## 2022-11-05 MED ORDER — ATORVASTATIN CALCIUM 80 MG PO TABS: 80.0000 mg | ORAL_TABLET | Freq: Every day | ORAL | 1 refills | Status: DC

## 2022-11-05 MED ORDER — MESALAMINE 1.2 G PO TBEC: 2.4000 g | DELAYED_RELEASE_TABLET | Freq: Every day | ORAL | 3 refills | Status: DC

## 2022-11-05 NOTE — Telephone Encounter (Signed)
Mesalamine sent to his new  pharmacy [facility] as patient requested. Ryleigh informed.

## 2022-11-06 DIAGNOSIS — R69 Illness, unspecified: Secondary | ICD-10-CM | POA: Diagnosis not present

## 2022-11-07 ENCOUNTER — Other Ambulatory Visit: Payer: Self-pay | Admitting: Internal Medicine

## 2022-11-07 DIAGNOSIS — N401 Enlarged prostate with lower urinary tract symptoms: Secondary | ICD-10-CM

## 2022-11-07 MED ORDER — TADALAFIL 5 MG PO TABS: 5.0000 mg | ORAL_TABLET | Freq: Every day | ORAL | 1 refills | Status: DC

## 2022-11-08 ENCOUNTER — Other Ambulatory Visit: Payer: Self-pay

## 2022-11-08 DIAGNOSIS — N401 Enlarged prostate with lower urinary tract symptoms: Secondary | ICD-10-CM

## 2022-11-08 MED ORDER — TADALAFIL 5 MG PO TABS: 5.0000 mg | ORAL_TABLET | Freq: Every day | ORAL | 1 refills | Status: DC

## 2022-11-26 ENCOUNTER — Encounter: Payer: Self-pay | Admitting: Cardiology

## 2022-11-26 ENCOUNTER — Ambulatory Visit: Payer: Medicare HMO | Admitting: Cardiology

## 2022-11-26 VITALS — BP 145/85 | HR 79 | Resp 18 | Ht 74.0 in | Wt 234.4 lb

## 2022-11-26 DIAGNOSIS — E785 Hyperlipidemia, unspecified: Secondary | ICD-10-CM

## 2022-11-26 DIAGNOSIS — I10 Essential (primary) hypertension: Secondary | ICD-10-CM

## 2022-11-26 DIAGNOSIS — I2510 Atherosclerotic heart disease of native coronary artery without angina pectoris: Secondary | ICD-10-CM

## 2022-11-26 DIAGNOSIS — I2584 Coronary atherosclerosis due to calcified coronary lesion: Secondary | ICD-10-CM | POA: Diagnosis not present

## 2022-11-26 NOTE — Progress Notes (Signed)
Date:  11/26/2022   ID:  [name], DOB 1956/05/19, MRN [id number]  PCP:  Janith Lima, MD  Cardiologist:  Rex Kras, DO, [facility] (established care 03/21/2021)  Date: 11/26/22 Last Office Visit: 10/11/2021  Chief Complaint  Patient presents with   coronary artery calcification   Follow-up    1 year    HPI  [name] is a 67 y.o. male whose past medical history and cardiovascular risk factors include: Severe coronary artery calcification, dyslipidemia, prediabetes, hypertension, advanced age.  Patient was referred to the practice for evaluation of coronary artery calcification and multiple cardiovascular risk factors.  In the past he has undergone appropriate ischemic workup and now presents for 1 year follow-up visit.  At the last office visit his torsemide was discontinued due to positive orthostatics and symptomatic dizziness.  For the last 1 year patient been busy taking care of a close friend who was on LVAD.  He recently passed about a month ago.  As a result of taking care of his friend he has not paid attention much to his overall health.  Over the last 1 year he has gained about 10 pounds, does not exercise daily.  And his blood pressures have trended up.  He denies anginal chest pain or heart failure symptoms.   FUNCTIONAL STATUS: No structured exercise program or daily routine.  ALLERGIES: No Known Allergies  MEDICATION LIST PRIOR TO VISIT: Current Meds  Medication Sig   amoxicillin (AMOXIL) 500 MG capsule Take 500 mg by mouth daily.   aspirin EC 81 MG tablet Take 1 tablet (81 mg total) by mouth daily. Swallow whole.   atorvastatin (LIPITOR) 80 MG tablet Take 1 tablet (80 mg total) by mouth daily.   DULoxetine (CYMBALTA) 60 MG capsule Take 60 mg by mouth daily.   dutasteride (AVODART) 0.5 MG capsule Take 1 capsule (0.5 mg total) by mouth daily.   Fish Oil-Cholecalciferol (FISH OIL + D3 PO) Take 1 capsule by mouth daily.   fluorouracil (EFUDEX) 5 %  cream Apply topically as needed.   hydrocortisone 2.5 % cream Apply 1 Application topically 2 (two) times daily.   lamoTRIgine (LAMICTAL) 150 MG tablet Take 150 mg by mouth 2 (two) times daily.   mesalamine (LIALDA) 1.2 g EC tablet Take 2 tablets (2.4 g total) by mouth daily.   Multiple Vitamin (MULTIVITAMIN) tablet Take 1 tablet by mouth daily.   olmesartan (BENICAR) 40 MG tablet Take 1 tablet (40 mg total) by mouth daily.   QUEtiapine (SEROQUEL) 300 MG tablet Take 300 mg by mouth at bedtime.   silodosin (RAPAFLO) 8 MG CAPS capsule Take 8 mg by mouth daily.   sodium chloride 1 g tablet Take 1 tablet (1 g total) by mouth 2 (two) times daily with a meal.   tadalafil (CIALIS) 5 MG tablet Take 1 tablet (5 mg total) by mouth daily.     PAST MEDICAL HISTORY: Past Medical History:  Diagnosis Date   Alcoholism ([place])    10 yrs mostly sober in AA    Anemia    Bipolar 2 disorder ([place])    on meds   Cancer ([place])    skin, basal cell   Depression    on meds   Hemorrhoids 11/23/2010   Colonoscopy with [facility]   Hyperlipemia    on meds   Hypertension    on meds   Muscle spasm of back    Personal history of colonic polyps 11/23/2010   5 mm adenoma Colonoscopy  with [facility]   Rosacea    Substance abuse ([place])    recovering alcoholic- "in remission"   Ulcerative colitis 11/23/2010   Colonoscopy with [facility]    PAST SURGICAL HISTORY: Past Surgical History:  Procedure Laterality Date   ACHILLES TENDON REPAIR Right 1995   BASAL CELL CARCINOMA EXCISION     COLONOSCOPY  2019   CG-MAC-prep good ([place])-benign colonic mucosas/recall 3 yr   COLONOSCOPY W/ BIOPSIES  2003, 04/2008, 11/2010   2003: proctitis 2009: ulcerative colitis and hemorrhoids 2012: ulcerative colitis and 19mm sigmoid  tubular adenoma   KNEE SURGERY Left 2023   MOHS SURGERY  2006   POLYPECTOMY     QUADRICEPS TENDON REPAIR Right 2021   SHOULDER SURGERY Right 2001   TONSILLECTOMY      FAMILY HISTORY: The patient family  history is not on file. He was adopted.  SOCIAL HISTORY:  The patient  reports that he has never smoked. He has never used smokeless tobacco. He reports that he does not currently use alcohol. He reports that he does not use drugs.  REVIEW OF SYSTEMS: Review of Systems  Constitutional: Negative for chills and fever.  HENT:  Negative for hoarse voice and nosebleeds.   Eyes:  Negative for discharge, double vision and pain.  Cardiovascular:  Negative for chest pain, claudication, dyspnea on exertion, leg swelling, near-syncope, orthopnea, palpitations, paroxysmal nocturnal dyspnea and syncope.  Respiratory:  Negative for hemoptysis and shortness of breath.   Musculoskeletal:  Negative for muscle cramps and myalgias.  Gastrointestinal:  Negative for abdominal pain, constipation, diarrhea, hematemesis, hematochezia, melena, nausea and vomiting.  Neurological:  Negative for dizziness and light-headedness.  All other systems reviewed and are negative.   PHYSICAL EXAM:    11/26/2022    9:53 AM 08/23/2022    8:18 AM 05/24/2022    9:26 AM  Vitals with BMI  Height 6\' 2"  6\' 2"    Weight 234 lbs 6 oz 226 lbs 13 oz   BMI Q000111Q [id number]   Systolic Q000111Q [id number]  Diastolic 85 82 80  Pulse 79 65    Physical Exam  Constitutional: No distress.  Age appropriate, hemodynamically stable.   Neck: No JVD present.  Cardiovascular: Normal rate, regular rhythm, S1 normal, S2 normal, intact distal pulses and normal pulses. Exam reveals no gallop, no S3 and no S4.  No murmur heard. Pulmonary/Chest: Effort normal and breath sounds normal. No stridor. He has no wheezes. He has no rales.  Abdominal: Soft. Bowel sounds are normal. He exhibits no distension. There is no abdominal tenderness.  Musculoskeletal:        General: No edema.     Cervical back: Neck supple.  Neurological: He is alert and oriented to person, place, and time. He has intact cranial nerves (2-12).  Skin: Skin is warm and moist.   CARDIAC  DATABASE: EKG: 11/26/2022: Sinus rhythm, 74 bpm, left axis, left anterior fascicular block, consider old anteroseptal infarct, IVCD.  Echocardiogram: 03/23/2021: Normal LV systolic function with visual EF 60-65%. Left ventricle cavity is normal in size. Mild left ventricular hypertrophy. Normal global wall motion. Normal diastolic filling pattern, normal LAP. Trace aortic regurgitation. Mild tricuspid regurgitation. No evidence of pulmonary hypertension. No prior study for comparison.   Stress Testing: Exercise nuclear stress test 10/02/2021: Normal ECG stress. The patient exercised for 3 minutes and 0 seconds of a Bruce protocol, achieving approximately 5.46 METs. Reduced exercise tolerance. No chest pain. The blood pressure response was normal. Myocardial perfusion is  normal. There was mild gut uptake artifact in the inferior wall and apical thinning. Minimal ischemia in this region cannot be excluded.  Overall LV systolic function is normal without regional wall motion abnormalities. Stress LV EF: 66%.  No previous exam available for comparison. Low risk.   Heart Catheterization: None  CAC scoring 06/14/2021:  Coronary calcium score of 437 is at the 81st percentile for the patient's age, sex and race.  LABORATORY DATA:    Latest Ref Rng & Units 06/13/2022    8:21 AM 11/28/2021    9:25 AM 04/05/2021    8:55 AM  CBC  WBC 4.0 - 10.5 K/uL 5.6  6.5  5.3   Hemoglobin 13.0 - 17.0 g/dL 13.9  13.2  12.8   Hematocrit 39.0 - 52.0 % 40.5  38.0  37.3   Platelets 150.0 - 400.0 K/uL 213.0  234.0  224.0        Latest Ref Rng & Units 08/23/2022    8:51 AM 06/13/2022    8:21 AM 11/28/2021    9:25 AM  CMP  Glucose 70 - 99 mg/dL 101  104  108   BUN 6 - 23 mg/dL 14  14  15    Creatinine 0.40 - 1.50 mg/dL 0.82  0.82  0.89   Sodium 135 - 145 mEq/L 132  130  128   Potassium 3.5 - 5.1 mEq/L 4.4  4.0  4.3   Chloride 96 - 112 mEq/L 97  96  96   CO2 19 - 32 mEq/L 29  26  22    Calcium 8.4 - 10.5  mg/dL 9.5  9.7  10.0   Total Protein 6.0 - 8.3 g/dL  7.4    Total Bilirubin 0.2 - 1.2 mg/dL  0.9    Alkaline Phos 39 - 117 U/L  68    AST 0 - 37 U/L  21    ALT 0 - 53 U/L  19      Lipid Panel  Lab Results  Component Value Date   CHOL 140 06/13/2022   HDL 54.80 06/13/2022   LDLCALC 62 06/13/2022   LDLDIRECT 102.0 09/09/2017   TRIG 117.0 06/13/2022   CHOLHDL 3 06/13/2022     No components found for: "NTPROBNP" No results for input(s): "PROBNP" in the last 8760 hours. Recent Labs    08/23/22 0851  TSH 0.96    BMP Recent Labs    11/28/21 0925 06/13/22 0821 08/23/22 0851  NA 128* 130* 132*  K 4.3 4.0 4.4  CL 96 96 97  CO2 22 26 29   GLUCOSE 108* 104* 101*  BUN 15 14 14   CREATININE 0.89 0.82 0.82  CALCIUM 10.0 9.7 9.5    HEMOGLOBIN A1C Lab Results  Component Value Date   HGBA1C 6.1 06/13/2022   External Labs: Collected:02/21/2021 Total cholesterol 132, triglycerides 61, HDL 53, LDL 67, non-HDL 78  IMPRESSION:    ICD-10-CM   1. Coronary atherosclerosis due to calcified coronary lesion  I25.10 EKG 12-Lead   I25.84     2. Benign hypertension  I10     3. Dyslipidemia  E78.5        RECOMMENDATIONS: [name] is a 67 y.o. male whose past medical history and cardiac risk factors include: Severe coronary artery calcification, dyslipidemia, prediabetes, hypertension, advanced age.  Coronary atherosclerosis due to calcified coronary lesion without angina pectoris Total CAC 437 AU, 81st percentile. EKG: Sinus rhythm. Prior echo and stress test results reviewed. Denies anginal chest pain or heart failure symptoms.  He has gained 10 pounds since last office visit.  He has reduced his functional capacity due to time limitations and taking care of a loved one.  Shared decision was to implement lifestyle changes, monitor blood pressures, and improved lipids, and focus on weight loss. Monitor for now  Benign hypertension Office blood pressures are not  well-controlled. I have asked him to keep a log of his blood pressures and to review it with PCP at the upcoming office visit. He currently is taking sodium chloride tablets given his history of hyponatremia.   Long-term need for sodium chloride tablets would be deferred to PCP.  Dyslipidemia Given his severe CAC would recommend LDL around 70 mg/dL.  His LDL levels have continued to trend up since 2022 likely due to weight gain. Recommend implementing lifestyle changes and have it rechecked with PCP at the upcoming visit. Currently taking atorvastatin 80 mg p.o. nightly.  Discussed management of at least 2 chronic comorbid conditions, independently performed and reviewed EKG, labs independently reviewed, prior echo and stress test results reviewed as part of today's office visit.   FINAL MEDICATION LIST END OF ENCOUNTER: No orders of the defined types were placed in this encounter.    Medications Discontinued During This Encounter  Medication Reason   QUEtiapine (SEROQUEL) 400 MG tablet       Current Outpatient Medications:    amoxicillin (AMOXIL) 500 MG capsule, Take 500 mg by mouth daily., Disp: , Rfl:    aspirin EC 81 MG tablet, Take 1 tablet (81 mg total) by mouth daily. Swallow whole., Disp: 30 tablet, Rfl: 11   atorvastatin (LIPITOR) 80 MG tablet, Take 1 tablet (80 mg total) by mouth daily., Disp: 90 tablet, Rfl: 1   DULoxetine (CYMBALTA) 60 MG capsule, Take 60 mg by mouth daily., Disp: , Rfl:    dutasteride (AVODART) 0.5 MG capsule, Take 1 capsule (0.5 mg total) by mouth daily., Disp: 90 capsule, Rfl: 1   Fish Oil-Cholecalciferol (FISH OIL + D3 PO), Take 1 capsule by mouth daily., Disp: , Rfl:    fluorouracil (EFUDEX) 5 % cream, Apply topically as needed., Disp: , Rfl:    hydrocortisone 2.5 % cream, Apply 1 Application topically 2 (two) times daily., Disp: , Rfl:    lamoTRIgine (LAMICTAL) 150 MG tablet, Take 150 mg by mouth 2 (two) times daily., Disp: , Rfl: 1   mesalamine  (LIALDA) 1.2 g EC tablet, Take 2 tablets (2.4 g total) by mouth daily., Disp: 180 tablet, Rfl: 3   Multiple Vitamin (MULTIVITAMIN) tablet, Take 1 tablet by mouth daily., Disp: , Rfl:    olmesartan (BENICAR) 40 MG tablet, Take 1 tablet (40 mg total) by mouth daily., Disp: 90 tablet, Rfl: 1   QUEtiapine (SEROQUEL) 300 MG tablet, Take 300 mg by mouth at bedtime., Disp: , Rfl:    silodosin (RAPAFLO) 8 MG CAPS capsule, Take 8 mg by mouth daily., Disp: , Rfl:    sodium chloride 1 g tablet, Take 1 tablet (1 g total) by mouth 2 (two) times daily with a meal., Disp: 180 tablet, Rfl: 0   tadalafil (CIALIS) 5 MG tablet, Take 1 tablet (5 mg total) by mouth daily., Disp: 90 tablet, Rfl: 1  Orders Placed This Encounter  Procedures   EKG 12-Lead    There are no Patient Instructions on file for this visit.   --Continue cardiac medications as reconciled in final medication list. --Return in about 6 months (around 05/29/2023) for Follow up, Coronary artery calcification. Or sooner if  needed. --Continue follow-up with your primary care physician regarding the management of your other chronic comorbid conditions.  Patient's questions and concerns were addressed to his satisfaction. He voices understanding of the instructions provided during this encounter.   This note was created using a voice recognition software as a result there may be grammatical errors inadvertently enclosed that do not reflect the nature of this encounter. Every attempt is made to correct such errors.  Rex Kras, [place], [facility]  Pager:  [phone] Office: [phone]

## 2022-11-30 ENCOUNTER — Telehealth: Payer: Self-pay | Admitting: Pharmacy Technician

## 2022-11-30 NOTE — Telephone Encounter (Signed)
PA not submitted due to patient not using for a medically accepted indication and previously denied.   Patient has stated that he pays for this medication through [facility].  Archiving request in [facility]

## 2022-11-30 NOTE — Telephone Encounter (Signed)
Received PA Request for Cialis- Key: [id number]

## 2022-12-05 DIAGNOSIS — D485 Neoplasm of uncertain behavior of skin: Secondary | ICD-10-CM | POA: Diagnosis not present

## 2022-12-05 DIAGNOSIS — L820 Inflamed seborrheic keratosis: Secondary | ICD-10-CM | POA: Diagnosis not present

## 2022-12-09 ENCOUNTER — Encounter: Payer: Self-pay | Admitting: Cardiology

## 2022-12-10 ENCOUNTER — Encounter: Payer: Self-pay | Admitting: Internal Medicine

## 2022-12-10 NOTE — Telephone Encounter (Signed)
From patient.

## 2022-12-19 ENCOUNTER — Encounter: Payer: Self-pay | Admitting: Internal Medicine

## 2023-01-09 ENCOUNTER — Encounter: Payer: Self-pay | Admitting: Internal Medicine

## 2023-01-22 DIAGNOSIS — F3181 Bipolar II disorder: Secondary | ICD-10-CM | POA: Diagnosis not present

## 2023-02-07 DIAGNOSIS — F319 Bipolar disorder, unspecified: Secondary | ICD-10-CM | POA: Insufficient documentation

## 2023-02-07 DIAGNOSIS — F1021 Alcohol dependence, in remission: Secondary | ICD-10-CM | POA: Insufficient documentation

## 2023-02-07 DIAGNOSIS — F1020 Alcohol dependence, uncomplicated: Secondary | ICD-10-CM | POA: Diagnosis not present

## 2023-02-12 ENCOUNTER — Ambulatory Visit: Payer: Medicare Other | Admitting: Internal Medicine

## 2023-02-13 DIAGNOSIS — Z85828 Personal history of other malignant neoplasm of skin: Secondary | ICD-10-CM | POA: Diagnosis not present

## 2023-02-13 DIAGNOSIS — C44319 Basal cell carcinoma of skin of other parts of face: Secondary | ICD-10-CM | POA: Diagnosis not present

## 2023-02-13 DIAGNOSIS — L821 Other seborrheic keratosis: Secondary | ICD-10-CM | POA: Diagnosis not present

## 2023-02-13 DIAGNOSIS — D485 Neoplasm of uncertain behavior of skin: Secondary | ICD-10-CM | POA: Diagnosis not present

## 2023-02-19 DIAGNOSIS — F3181 Bipolar II disorder: Secondary | ICD-10-CM | POA: Diagnosis not present

## 2023-03-20 DIAGNOSIS — C44319 Basal cell carcinoma of skin of other parts of face: Secondary | ICD-10-CM | POA: Diagnosis not present

## 2023-03-27 DIAGNOSIS — Z85828 Personal history of other malignant neoplasm of skin: Secondary | ICD-10-CM | POA: Diagnosis not present

## 2023-03-27 DIAGNOSIS — L812 Freckles: Secondary | ICD-10-CM | POA: Diagnosis not present

## 2023-03-27 DIAGNOSIS — L570 Actinic keratosis: Secondary | ICD-10-CM | POA: Diagnosis not present

## 2023-03-27 DIAGNOSIS — L821 Other seborrheic keratosis: Secondary | ICD-10-CM | POA: Diagnosis not present

## 2023-03-29 DIAGNOSIS — Z0100 Encounter for examination of eyes and vision without abnormal findings: Secondary | ICD-10-CM | POA: Diagnosis not present

## 2023-04-03 ENCOUNTER — Encounter: Payer: Self-pay | Admitting: Internal Medicine

## 2023-04-03 ENCOUNTER — Other Ambulatory Visit: Payer: Self-pay

## 2023-04-03 DIAGNOSIS — K51019 Ulcerative (chronic) pancolitis with unspecified complications: Secondary | ICD-10-CM

## 2023-04-03 MED ORDER — MESALAMINE 1.2 G PO TBEC: 2.4000 g | DELAYED_RELEASE_TABLET | Freq: Every day | ORAL | 3 refills | Status: DC

## 2023-04-03 NOTE — Telephone Encounter (Signed)
Mesalamine refilled to [facility] per patient request.

## 2023-04-04 DIAGNOSIS — Z0100 Encounter for examination of eyes and vision without abnormal findings: Secondary | ICD-10-CM | POA: Diagnosis not present

## 2023-04-12 ENCOUNTER — Encounter: Payer: Self-pay | Admitting: Internal Medicine

## 2023-04-18 ENCOUNTER — Encounter (INDEPENDENT_AMBULATORY_CARE_PROVIDER_SITE_OTHER): Payer: Self-pay

## 2023-04-19 ENCOUNTER — Other Ambulatory Visit: Payer: Self-pay | Admitting: Internal Medicine

## 2023-04-19 DIAGNOSIS — I10 Essential (primary) hypertension: Secondary | ICD-10-CM

## 2023-04-19 DIAGNOSIS — N401 Enlarged prostate with lower urinary tract symptoms: Secondary | ICD-10-CM

## 2023-04-19 DIAGNOSIS — E785 Hyperlipidemia, unspecified: Secondary | ICD-10-CM

## 2023-04-25 DIAGNOSIS — R351 Nocturia: Secondary | ICD-10-CM | POA: Diagnosis not present

## 2023-04-25 DIAGNOSIS — R3912 Poor urinary stream: Secondary | ICD-10-CM | POA: Diagnosis not present

## 2023-04-25 DIAGNOSIS — N401 Enlarged prostate with lower urinary tract symptoms: Secondary | ICD-10-CM | POA: Diagnosis not present

## 2023-04-27 ENCOUNTER — Encounter: Payer: Self-pay | Admitting: Internal Medicine

## 2023-05-09 ENCOUNTER — Other Ambulatory Visit: Payer: Self-pay | Admitting: Internal Medicine

## 2023-05-09 DIAGNOSIS — R4181 Age-related cognitive decline: Secondary | ICD-10-CM | POA: Insufficient documentation

## 2023-05-09 DIAGNOSIS — F3181 Bipolar II disorder: Secondary | ICD-10-CM | POA: Diagnosis not present

## 2023-05-24 ENCOUNTER — Encounter: Payer: Self-pay | Admitting: Internal Medicine

## 2023-05-27 ENCOUNTER — Encounter: Payer: Medicare HMO | Admitting: Internal Medicine

## 2023-05-28 ENCOUNTER — Encounter: Payer: Self-pay | Admitting: Internal Medicine

## 2023-05-30 ENCOUNTER — Ambulatory Visit: Payer: Medicare HMO | Admitting: Cardiology

## 2023-06-03 ENCOUNTER — Encounter: Payer: Self-pay | Admitting: Internal Medicine

## 2023-06-03 ENCOUNTER — Ambulatory Visit: Payer: Medicare HMO | Admitting: Internal Medicine

## 2023-06-03 VITALS — BP 138/72 | HR 68 | Wt 233.0 lb

## 2023-06-03 DIAGNOSIS — K5100 Ulcerative (chronic) pancolitis without complications: Secondary | ICD-10-CM

## 2023-06-03 NOTE — Patient Instructions (Addendum)
Glad things are going well.  Stay on the mesalamine (Lialda).  Colonoscopy should be in Summer 2026 unless you have problems and that is when we can see each other again (unless problems).  I appreciate the opportunity to care for you. Iva Boop, MD, [facility]

## 2023-06-03 NOTE — Progress Notes (Signed)
[name] 67 y.o. [birth year] [id number]  Assessment & Plan:   Encounter Diagnosis  Name Primary?   Universal ulcerative colitis without complication (HCC) Yes      Subjective:   GI summary: Universal ulcerative colitis diagnosed 2003 Dr. Bosie Clos transitioned to Dr. Leone Payor 2012 has been in remission on mesalamine last colonoscopy July 2023 without endoscopic or microscopic findings of UC.  History of adenomatous colon polyps 5 mm sigmoid adenoma removed in the setting of UC years ago, 2014 no polyps 2016 adenoma and SSP, no polyps 2019 and 2023  ----------------------------------------------------------------------------------------------  Chief Complaint: Follow-up ulcerative colitis  HPI  Discussed the use of AI scribe software for clinical note transcription with the patient, who gave verbal consent to proceed.   The patient, with a history of ulcerative colitis, presents for a routine checkup. He reports feeling well with no current symptoms of diarrhea or abdominal pain. His last colonoscopy in July 2023 showed mild nonspecific inflammation but no changes of inflammatory bowel disease. He is currently taking Lialda (mesalamine) 2.4 grams daily, which he believes is effectively managing his condition. He was previously obtaining this medication from [place], but has since switched to [place] as the price is comparable.        Diverticulosis in the left colon and in the right                            colon.                           - External and internal hemorrhoids.                           - The examination was otherwise normal on direct                            and retroflexion views.                           - Biopsies for surveillance were taken from the                            entire colon.                           - Personal history of colonic polyps. hx adenoma                            years ago and then small ssp and adenoma 2016  No Known  Allergies Current Meds  Medication Sig   amoxicillin (AMOXIL) 500 MG capsule Take 500 mg by mouth daily.   aspirin EC 81 MG tablet Take 1 tablet (81 mg total) by mouth daily. Swallow whole.   atorvastatin (LIPITOR) 80 MG tablet TAKE 1 TABLET DAILY   DULoxetine (CYMBALTA) 60 MG capsule Take 60 mg by mouth daily.   dutasteride (AVODART) 0.5 MG capsule TAKE 1 CAPSULE DAILY   Fish Oil-Cholecalciferol (FISH OIL + D3 PO) Take 1 capsule by mouth daily.   fluorouracil (EFUDEX) 5 % cream Apply topically as needed.   hydrocortisone 2.5 % cream Apply 1 Application topically 2 (two) times daily.   lamoTRIgine (LAMICTAL)  150 MG tablet Take 150 mg by mouth 2 (two) times daily.   mesalamine (LIALDA) 1.2 g EC tablet Take 2 tablets (2.4 g total) by mouth daily.   Multiple Vitamin (MULTIVITAMIN) tablet Take 1 tablet by mouth daily.   olmesartan (BENICAR) 40 MG tablet TAKE 1 TABLET DAILY   QUEtiapine (SEROQUEL) 300 MG tablet Take 300 mg by mouth at bedtime.   silodosin (RAPAFLO) 8 MG CAPS capsule Take 8 mg by mouth daily.   Past Medical History:  Diagnosis Date   Alcoholism (HCC)    10 yrs mostly sober in AA    Anemia    Bipolar 2 disorder (HCC)    on meds   Cancer (HCC)    skin, basal cell   Depression    on meds   Hemorrhoids 11/23/2010   Colonoscopy with [facility]   Hyperlipemia    on meds   Hypertension    on meds   Muscle spasm of back    Personal history of colonic polyps 11/23/2010   5 mm adenoma Colonoscopy with [facility]   Rosacea    Substance abuse (HCC)    recovering alcoholic- "in remission"   Ulcerative colitis 11/23/2010   Colonoscopy with [facility]   Past Surgical History:  Procedure Laterality Date   ACHILLES TENDON REPAIR Right 1995   BASAL CELL CARCINOMA EXCISION     COLONOSCOPY  2019   CG-MAC-prep good ([place])-benign colonic mucosas/recall 3 yr   COLONOSCOPY W/ BIOPSIES  2003, 04/2008, 11/2010   2003: proctitis 2009: ulcerative colitis and hemorrhoids 2012: ulcerative  colitis and 5mm sigmoid  tubular adenoma   KNEE SURGERY Left 2023   MOHS SURGERY  2006   POLYPECTOMY     QUADRICEPS TENDON REPAIR Right 2021   SHOULDER SURGERY Right 2001   TONSILLECTOMY     Social History   Social History Narrative   1-5 caffeine drinks daily    Exercise 2-3 times a week   family history is not on file. He was adopted.   Review of Systems As per HPI  Objective:   Physical Exam BP 138/72   Pulse 68   Wt 233 lb (105.7 kg)   BMI 29.92 kg/m

## 2023-06-06 DIAGNOSIS — F3181 Bipolar II disorder: Secondary | ICD-10-CM | POA: Diagnosis not present

## 2023-06-11 DIAGNOSIS — F319 Bipolar disorder, unspecified: Secondary | ICD-10-CM | POA: Diagnosis not present

## 2023-06-11 DIAGNOSIS — F1020 Alcohol dependence, uncomplicated: Secondary | ICD-10-CM | POA: Diagnosis not present

## 2023-06-26 ENCOUNTER — Ambulatory Visit: Payer: Medicare HMO | Admitting: Cardiology

## 2023-07-01 DIAGNOSIS — F3181 Bipolar II disorder: Secondary | ICD-10-CM | POA: Diagnosis not present

## 2023-07-08 ENCOUNTER — Encounter: Payer: Self-pay | Admitting: Internal Medicine

## 2023-07-11 ENCOUNTER — Encounter: Payer: Medicare HMO | Admitting: Internal Medicine

## 2023-07-15 DIAGNOSIS — F3181 Bipolar II disorder: Secondary | ICD-10-CM | POA: Diagnosis not present

## 2023-07-20 ENCOUNTER — Other Ambulatory Visit: Payer: Self-pay | Admitting: Internal Medicine

## 2023-07-20 DIAGNOSIS — E785 Hyperlipidemia, unspecified: Secondary | ICD-10-CM

## 2023-07-20 DIAGNOSIS — I10 Essential (primary) hypertension: Secondary | ICD-10-CM

## 2023-08-05 DIAGNOSIS — F3181 Bipolar II disorder: Secondary | ICD-10-CM | POA: Diagnosis not present

## 2023-08-06 ENCOUNTER — Encounter: Payer: Self-pay | Admitting: Internal Medicine

## 2023-08-13 ENCOUNTER — Encounter: Payer: Medicare HMO | Admitting: Internal Medicine

## 2023-08-14 ENCOUNTER — Encounter: Payer: Medicare HMO | Admitting: Internal Medicine

## 2023-09-04 ENCOUNTER — Encounter: Payer: Self-pay | Admitting: Cardiology

## 2023-09-04 ENCOUNTER — Encounter: Payer: Self-pay | Admitting: Internal Medicine

## 2023-09-06 ENCOUNTER — Other Ambulatory Visit: Payer: Self-pay | Admitting: Internal Medicine

## 2023-09-06 DIAGNOSIS — N401 Enlarged prostate with lower urinary tract symptoms: Secondary | ICD-10-CM

## 2023-09-10 ENCOUNTER — Encounter: Payer: Self-pay | Admitting: Internal Medicine

## 2023-09-12 DIAGNOSIS — F3181 Bipolar II disorder: Secondary | ICD-10-CM | POA: Diagnosis not present

## 2023-09-18 ENCOUNTER — Encounter: Payer: Self-pay | Admitting: Cardiology

## 2023-09-23 ENCOUNTER — Other Ambulatory Visit: Payer: Self-pay | Admitting: Internal Medicine

## 2023-09-24 ENCOUNTER — Ambulatory Visit: Payer: Medicare Other | Admitting: Cardiology

## 2023-09-24 ENCOUNTER — Other Ambulatory Visit: Payer: Self-pay

## 2023-09-24 ENCOUNTER — Encounter: Payer: Self-pay | Admitting: Internal Medicine

## 2023-09-24 DIAGNOSIS — K51019 Ulcerative (chronic) pancolitis with unspecified complications: Secondary | ICD-10-CM

## 2023-09-24 MED ORDER — MESALAMINE 1.2 G PO TBEC: 2.4000 g | DELAYED_RELEASE_TABLET | Freq: Every day | ORAL | 3 refills | Status: DC

## 2023-09-24 NOTE — Telephone Encounter (Signed)
I called Joncarlos and left him a detailed message that I have sent in his mesalamine for a year and for him to please email [place] a copy of his new insurance card to : [email or web address]. I also told him to inform [facility] of the change in insurance.  He had MYCHARTED this request to [place] about his refill.

## 2023-09-27 ENCOUNTER — Encounter: Payer: Self-pay | Admitting: Cardiology

## 2023-09-30 DIAGNOSIS — L570 Actinic keratosis: Secondary | ICD-10-CM | POA: Diagnosis not present

## 2023-09-30 DIAGNOSIS — C44519 Basal cell carcinoma of skin of other part of trunk: Secondary | ICD-10-CM | POA: Diagnosis not present

## 2023-09-30 DIAGNOSIS — L821 Other seborrheic keratosis: Secondary | ICD-10-CM | POA: Diagnosis not present

## 2023-09-30 DIAGNOSIS — C44729 Squamous cell carcinoma of skin of left lower limb, including hip: Secondary | ICD-10-CM | POA: Diagnosis not present

## 2023-09-30 DIAGNOSIS — L812 Freckles: Secondary | ICD-10-CM | POA: Diagnosis not present

## 2023-09-30 DIAGNOSIS — Z85828 Personal history of other malignant neoplasm of skin: Secondary | ICD-10-CM | POA: Diagnosis not present

## 2023-10-09 ENCOUNTER — Encounter: Payer: Self-pay | Admitting: Cardiology

## 2023-10-09 ENCOUNTER — Encounter: Payer: Self-pay | Admitting: Internal Medicine

## 2023-10-09 ENCOUNTER — Other Ambulatory Visit: Payer: Self-pay

## 2023-10-09 DIAGNOSIS — F3181 Bipolar II disorder: Secondary | ICD-10-CM | POA: Diagnosis not present

## 2023-10-09 MED ORDER — AMOXICILLIN 500 MG PO CAPS: 500.0000 mg | ORAL_CAPSULE | Freq: Every day | ORAL | 0 refills | Status: DC

## 2023-10-10 DIAGNOSIS — F319 Bipolar disorder, unspecified: Secondary | ICD-10-CM | POA: Diagnosis not present

## 2023-10-10 DIAGNOSIS — F1020 Alcohol dependence, uncomplicated: Secondary | ICD-10-CM | POA: Diagnosis not present

## 2023-10-11 ENCOUNTER — Other Ambulatory Visit: Payer: Self-pay | Admitting: Internal Medicine

## 2023-10-11 DIAGNOSIS — E785 Hyperlipidemia, unspecified: Secondary | ICD-10-CM

## 2023-10-11 DIAGNOSIS — I10 Essential (primary) hypertension: Secondary | ICD-10-CM

## 2023-10-11 DIAGNOSIS — N401 Enlarged prostate with lower urinary tract symptoms: Secondary | ICD-10-CM

## 2023-10-11 MED ORDER — DUTASTERIDE 0.5 MG PO CAPS: 0.5000 mg | ORAL_CAPSULE | Freq: Every day | ORAL | 0 refills | Status: DC

## 2023-10-11 MED ORDER — ATORVASTATIN CALCIUM 80 MG PO TABS: 80.0000 mg | ORAL_TABLET | Freq: Every day | ORAL | 0 refills | Status: DC

## 2023-10-11 MED ORDER — SILODOSIN 8 MG PO CAPS: 8.0000 mg | ORAL_CAPSULE | Freq: Every day | ORAL | 0 refills | Status: DC

## 2023-10-11 MED ORDER — OLMESARTAN MEDOXOMIL 40 MG PO TABS: 40.0000 mg | ORAL_TABLET | Freq: Every day | ORAL | 0 refills | Status: DC

## 2023-10-14 DIAGNOSIS — H524 Presbyopia: Secondary | ICD-10-CM | POA: Diagnosis not present

## 2023-11-05 ENCOUNTER — Ambulatory Visit: Payer: Medicare Other

## 2023-11-13 ENCOUNTER — Ambulatory Visit: Payer: Medicare Other | Admitting: Cardiology

## 2023-11-19 ENCOUNTER — Encounter: Payer: Medicare Other | Admitting: Internal Medicine

## 2023-11-21 DIAGNOSIS — K080 Exfoliation of teeth due to systemic causes: Secondary | ICD-10-CM | POA: Diagnosis not present

## 2023-11-25 ENCOUNTER — Ambulatory Visit: Payer: Medicare Other | Admitting: Cardiology

## 2023-11-28 DIAGNOSIS — M25511 Pain in right shoulder: Secondary | ICD-10-CM | POA: Insufficient documentation

## 2023-12-11 DIAGNOSIS — F3181 Bipolar II disorder: Secondary | ICD-10-CM | POA: Diagnosis not present

## 2023-12-19 ENCOUNTER — Ambulatory Visit: Admitting: Cardiology

## 2023-12-20 ENCOUNTER — Other Ambulatory Visit: Payer: Self-pay | Admitting: *Deleted

## 2023-12-20 ENCOUNTER — Encounter: Payer: Self-pay | Admitting: Cardiology

## 2023-12-20 ENCOUNTER — Encounter: Payer: Self-pay | Admitting: Internal Medicine

## 2023-12-24 ENCOUNTER — Encounter: Admitting: Internal Medicine

## 2023-12-30 DIAGNOSIS — M25511 Pain in right shoulder: Secondary | ICD-10-CM | POA: Diagnosis not present

## 2024-01-01 ENCOUNTER — Telehealth: Payer: Self-pay

## 2024-01-01 DIAGNOSIS — K51019 Ulcerative (chronic) pancolitis with unspecified complications: Secondary | ICD-10-CM

## 2024-01-01 NOTE — Telephone Encounter (Signed)
 We received a fax from [facility] in [place], [place] requesting a refill on his Mesalamine  1.2 g tablets. I have left [name] a message to call me to confirm this is where he wants his refill to go.

## 2024-01-02 MED ORDER — MESALAMINE 1.2 G PO TBEC: 2.4000 g | DELAYED_RELEASE_TABLET | Freq: Every day | ORAL | 3 refills | Status: DC

## 2024-01-02 NOTE — Telephone Encounter (Addendum)
 I have faxed the form to [facility] at fax # [phone] and got confirmation it went thru. This is part of [email or web address]

## 2024-01-02 NOTE — Telephone Encounter (Signed)
 Inbound call from patient, confirming pharmacy below is correct.

## 2024-01-08 DIAGNOSIS — F3181 Bipolar II disorder: Secondary | ICD-10-CM | POA: Diagnosis not present

## 2024-01-09 ENCOUNTER — Encounter: Payer: Self-pay | Admitting: Cardiology

## 2024-01-09 ENCOUNTER — Ambulatory Visit: Attending: Cardiology | Admitting: Cardiology

## 2024-01-09 VITALS — BP 102/68 | HR 99 | Resp 16 | Ht 74.0 in | Wt 233.2 lb

## 2024-01-09 DIAGNOSIS — I2584 Coronary atherosclerosis due to calcified coronary lesion: Secondary | ICD-10-CM

## 2024-01-09 DIAGNOSIS — I2510 Atherosclerotic heart disease of native coronary artery without angina pectoris: Secondary | ICD-10-CM | POA: Diagnosis not present

## 2024-01-09 DIAGNOSIS — E785 Hyperlipidemia, unspecified: Secondary | ICD-10-CM | POA: Diagnosis not present

## 2024-01-09 DIAGNOSIS — I10 Essential (primary) hypertension: Secondary | ICD-10-CM | POA: Diagnosis not present

## 2024-01-09 NOTE — Progress Notes (Signed)
 Cardiology Office Note:  .   Date:  01/09/2024  ID:  [name], DOB [birth year], MRN [id number] PCP:  Arcadio Knuckles, MD  Former Cardiology Providers: NA [facility] Providers Cardiologist:  [name], DO , [facility] (established care 03/21/2021) Electrophysiologist:  None  Click to update primary MD,subspecialty MD or APP then REFRESH:1}    Chief Complaint  Patient presents with   Coronary atherosclerosis due to calcified coronary lesion   Follow-up    History of Present Illness: .   [name] is a 68 y.o. Caucasian male whose past medical history and cardiovascular risk factors includes: Severe coronary artery calcification, dyslipidemia, prediabetes, hypertension, advanced age.  Patient is being followed by the practice given his severe coronary calcification and cardiovascular risk factors.  He presents today for 1 year follow-up visit.  Since last office visit patient denies any anginal chest pain or heart failure symptoms.  No hospitalizations or urgent care visits for cardiovascular reasons.  He has been compliant with his medical therapy.  No significant weight gain.  Physical endurance remains stable, walks for 30 minutes 5 days a week .   Review of Systems: .   Review of Systems  Cardiovascular:  Negative for chest pain, claudication, irregular heartbeat, leg swelling, near-syncope, orthopnea, palpitations, paroxysmal nocturnal dyspnea and syncope.  Respiratory:  Negative for shortness of breath.   Hematologic/Lymphatic: Negative for bleeding problem.    Studies Reviewed:   EKG: EKG Interpretation Date/Time:  Thursday Jan 09 2024 08:09:33 EDT Ventricular Rate:  92 PR Interval:  162 QRS Duration:  106 QT Interval:  354 QTC Calculation: 437 R Axis:   -43  Text Interpretation: Normal sinus rhythm Left axis deviation Consider Anterior infarct , age undetermined When compared with ECG of 27-Sep-2000 11:47, No significant change since last tracing Confirmed  by [name] [phone]) on 01/09/2024 8:12:01 AM  Echocardiogram: 03/23/2021: Normal LV systolic function with visual EF 60-65%. Left ventricle cavity is normal in size. Mild left ventricular hypertrophy. Normal global wall motion. Normal diastolic filling pattern, normal LAP. Trace aortic regurgitation. Mild tricuspid regurgitation. No evidence of pulmonary hypertension. No prior study for comparison.   Stress Testing: Exercise nuclear stress test 10/02/2021: Low risk.    Heart Catheterization: None   CAC scoring 06/14/2021:  Coronary calcium  score of 437 is at the 81st percentile for the patient's age, sex and race.  RADIOLOGY: NA  Risk Assessment/Calculations:   NA   Labs:       Latest Ref Rng & Units 06/13/2022    8:21 AM 11/28/2021    9:25 AM 04/05/2021    8:55 AM  CBC  WBC 4.0 - 10.5 K/uL 5.6  6.5  5.3   Hemoglobin 13.0 - 17.0 g/dL 82.9  56.2  13.0   Hematocrit 39.0 - 52.0 % 40.5  38.0  37.3   Platelets 150.0 - 400.0 K/uL 213.0  234.0  224.0        Latest Ref Rng & Units 08/23/2022    8:51 AM 06/13/2022    8:21 AM 11/28/2021    9:25 AM  BMP  Glucose 70 - 99 mg/dL 865  784  696   BUN 6 - 23 mg/dL 14  14  15    Creatinine 0.40 - 1.50 mg/dL 2.95  2.84  1.32   Sodium 135 - 145 mEq/L 132  130  128   Potassium 3.5 - 5.1 mEq/L 4.4  4.0  4.3   Chloride 96 - 112 mEq/L 97  96  96   CO2 19 - 32 mEq/L 29  26  22    Calcium  8.4 - 10.5 mg/dL 9.5  9.7  16.1       Latest Ref Rng & Units 08/23/2022    8:51 AM 06/13/2022    8:21 AM 11/28/2021    9:25 AM  CMP  Glucose 70 - 99 mg/dL 096  045  409   BUN 6 - 23 mg/dL 14  14  15    Creatinine 0.40 - 1.50 mg/dL 8.11  9.14  7.82   Sodium 135 - 145 mEq/L 132  130  128   Potassium 3.5 - 5.1 mEq/L 4.4  4.0  4.3   Chloride 96 - 112 mEq/L 97  96  96   CO2 19 - 32 mEq/L 29  26  22    Calcium  8.4 - 10.5 mg/dL 9.5  9.7  95.6   Total Protein 6.0 - 8.3 g/dL  7.4    Total Bilirubin 0.2 - 1.2 mg/dL  0.9    Alkaline Phos 39 - 117 U/L  68     AST 0 - 37 U/L  21    ALT 0 - 53 U/L  19       Lab Results  Component Value Date   CHOL 140 06/13/2022   HDL 54.80 06/13/2022   LDLCALC 62 06/13/2022   LDLDIRECT 102.0 09/09/2017   TRIG 117.0 06/13/2022   CHOLHDL 3 06/13/2022   No results for input(s): "LIPOA" in the last 8760 hours. No components found for: "NTPROBNP" No results for input(s): "PROBNP" in the last 8760 hours. No results for input(s): "TSH" in the last 8760 hours.  Physical Exam:    Today's Vitals   01/09/24 0806  BP: 102/68  Pulse: 99  Resp: 16  SpO2: 93%  Weight: 233 lb 3.2 oz (105.8 kg)  Height: 6\' 2"  (1.88 m)   Body mass index is 29.94 kg/m. Wt Readings from Last 3 Encounters:  01/09/24 233 lb 3.2 oz (105.8 kg)  06/03/23 233 lb (105.7 kg)  11/26/22 234 lb 6.4 oz (106.3 kg)    Physical Exam  Constitutional: No distress.  hemodynamically stable  Neck: No JVD present.  Cardiovascular: Normal rate, regular rhythm, S1 normal and S2 normal. Exam reveals no gallop, no S3 and no S4.  No murmur heard. Pulmonary/Chest: Effort normal and breath sounds normal. No stridor. He has no wheezes. He has no rales.  Musculoskeletal:        General: No edema.     Cervical back: Neck supple.  Skin: Skin is warm.    Impression & Recommendation(s):  Impression:   ICD-10-CM   1. Coronary atherosclerosis due to calcified coronary lesion  I25.10 EKG 12-Lead   I25.84 ECHOCARDIOGRAM COMPLETE    2. Benign hypertension  I10 ECHOCARDIOGRAM COMPLETE    3. Dyslipidemia  E78.5        Recommendation(s):  Coronary atherosclerosis due to calcified coronary lesion Total CAC 437 AU, 81st percentile. Continue aspirin  and statin therapy. Overall function capacity remains relatively stable. No recent labs available for review, has an appoint with PCP next week for his annual well visit and will send us  a copy of his lipids for reference. Reemphasized the importance of secondary prevention with focus on improving her  modifiable cardiovascular risk factors such as glycemic control, lipid management, blood pressure control, weight loss. Plan echocardiogram prior to the next office visit  Benign hypertension All of his blood pressures are well-controlled. Continue olmesartan  40 mg p.o. daily  Dyslipidemia Last lipid profile available for review is from October 2023 from [facility] database which illustrates LDL at 62 mg/dL. Currently on Lipitor 80 mg p.o. nightly   Orders Placed:  Orders Placed This Encounter  Procedures   EKG 12-Lead   ECHOCARDIOGRAM COMPLETE    Standing Status:   Future    Expected Date:   01/08/2025    Expiration Date:   01/08/2025    Where should this test be performed[facility]    Does the patient weigh less than or greater than 250 lbs?:   Patient weighs less than 250 lbs    Perflutren DEFINITY (image enhancing agent) should be administered unless hypersensitivity or allergy exist:   Administer Perflutren    Reason for exam-Echo:   Other-Full Diagnosis List    Full ICD-10/Reason for Exam:   Coronary atherosclerosis [161096]    Full ICD-10/Reason for Exam:   HTN (hypertension) [045409]     Final Medication List:   No orders of the defined types were placed in this encounter.   There are no discontinued medications.   Current Outpatient Medications:    amoxicillin  (AMOXIL ) 500 MG capsule, Take 1 capsule (500 mg total) by mouth daily., Disp: 90 capsule, Rfl: 0   aspirin  EC 81 MG tablet, Take 1 tablet (81 mg total) by mouth daily. Swallow whole., Disp: 30 tablet, Rfl: 11   atorvastatin  (LIPITOR) 80 MG tablet, Take 1 tablet (80 mg total) by mouth daily., Disp: 90 tablet, Rfl: 0   DULoxetine (CYMBALTA) 60 MG capsule, Take 60 mg by mouth daily., Disp: , Rfl:    dutasteride  (AVODART ) 0.5 MG capsule, Take 1 capsule (0.5 mg total) by mouth daily., Disp: 90 capsule, Rfl: 0   Fish Oil-Cholecalciferol (FISH OIL + D3 PO), Take 1 capsule by mouth daily., Disp: , Rfl:     fluorouracil (EFUDEX) 5 % cream, Apply topically as needed., Disp: , Rfl:    hydrocortisone 2.5 % cream, Apply 1 Application topically 2 (two) times daily., Disp: , Rfl:    lamoTRIgine (LAMICTAL) 150 MG tablet, Take 150 mg by mouth 2 (two) times daily., Disp: , Rfl: 1   mesalamine  (LIALDA ) 1.2 g EC tablet, Take 2 tablets (2.4 g total) by mouth daily., Disp: 150 tablet, Rfl: 3   Multiple Vitamin (MULTIVITAMIN) tablet, Take 1 tablet by mouth daily., Disp: , Rfl:    olmesartan  (BENICAR ) 40 MG tablet, Take 1 tablet (40 mg total) by mouth daily., Disp: 90 tablet, Rfl: 0   QUEtiapine (SEROQUEL) 200 MG tablet, Take 200 mg by mouth at bedtime., Disp: , Rfl:    silodosin  (RAPAFLO ) 8 MG CAPS capsule, Take 1 capsule (8 mg total) by mouth daily., Disp: 90 capsule, Rfl: 0  Consent:   NA  Disposition:   1 year follow-up sooner if needed  His questions and concerns were addressed to his satisfaction. He voices understanding of the recommendations provided during this encounter.    Signed, Awilda Bogus, [facility]  [facility]  01/09/2024 8:38 AM

## 2024-01-09 NOTE — Patient Instructions (Signed)
 Medication Instructions:  Your physician recommends that you continue on your current medications as directed. Please refer to the Current Medication list given to you today.  *If you need a refill on your cardiac medications before your next appointment, please call your pharmacy*  Lab Work: None ordered today. If you have labs (blood work) drawn today and your tests are completely normal, you will receive your results only by: MyChart Message (if you have MyChart) OR A paper copy in the mail If you have any lab test that is abnormal or we need to change your treatment, we will call you to review the results.  Testing/Procedures: Your physician has requested that you have an echocardiogram in 1 year prior to follow-up with Dr. Albert Huff. Echocardiography is a painless test that uses sound waves to create images of your heart. It provides your doctor with information about the size and shape of your heart and how well your heart's chambers and valves are working. This procedure takes approximately one hour. There are no restrictions for this procedure. Please do NOT wear cologne, perfume, aftershave, or lotions (deodorant is allowed). Please arrive 15 minutes prior to your appointment time.  Please note: We ask at that you not bring children with you during ultrasound (echo/ vascular) testing. Due to room size and safety concerns, children are not allowed in the ultrasound rooms during exams. Our front office staff cannot provide observation of children in our lobby area while testing is being conducted. An adult accompanying a patient to their appointment will only be allowed in the ultrasound room at the discretion of the ultrasound technician under special circumstances. We apologize for any inconvenience.   Follow-Up: At [facility], you and your health needs are our priority.  As part of our continuing mission to provide you with exceptional heart care, our providers are all part of  one team.  This team includes your primary Cardiologist (physician) and Advanced Practice Providers or APPs (Physician Assistants and Nurse Practitioners) who all work together to provide you with the care you need, when you need it.  Your next appointment:   1 year(s)  The format for your next appointment:   In Person  Provider:   [name], [facility]

## 2024-01-13 ENCOUNTER — Encounter: Payer: Self-pay | Admitting: Cardiology

## 2024-01-15 ENCOUNTER — Ambulatory Visit: Admitting: Internal Medicine

## 2024-01-15 ENCOUNTER — Ambulatory Visit: Payer: Self-pay | Admitting: Internal Medicine

## 2024-01-15 ENCOUNTER — Encounter: Payer: Self-pay | Admitting: Internal Medicine

## 2024-01-15 VITALS — BP 94/60 | HR 92 | Temp 98.2°F | Ht 74.0 in | Wt 230.4 lb

## 2024-01-15 DIAGNOSIS — R3911 Hesitancy of micturition: Secondary | ICD-10-CM

## 2024-01-15 DIAGNOSIS — E871 Hypo-osmolality and hyponatremia: Secondary | ICD-10-CM

## 2024-01-15 DIAGNOSIS — N401 Enlarged prostate with lower urinary tract symptoms: Secondary | ICD-10-CM

## 2024-01-15 DIAGNOSIS — E785 Hyperlipidemia, unspecified: Secondary | ICD-10-CM

## 2024-01-15 DIAGNOSIS — I10 Essential (primary) hypertension: Secondary | ICD-10-CM

## 2024-01-15 DIAGNOSIS — R7303 Prediabetes: Secondary | ICD-10-CM | POA: Diagnosis not present

## 2024-01-15 DIAGNOSIS — Z0000 Encounter for general adult medical examination without abnormal findings: Secondary | ICD-10-CM | POA: Diagnosis not present

## 2024-01-15 DIAGNOSIS — E2740 Unspecified adrenocortical insufficiency: Secondary | ICD-10-CM

## 2024-01-15 DIAGNOSIS — Z0001 Encounter for general adult medical examination with abnormal findings: Secondary | ICD-10-CM

## 2024-01-15 DIAGNOSIS — I950 Idiopathic hypotension: Secondary | ICD-10-CM | POA: Diagnosis not present

## 2024-01-15 LAB — BASIC METABOLIC PANEL WITH GFR
BUN: 10 mg/dL (ref 6–23)
CO2: 24 meq/L (ref 19–32)
Calcium: 9.3 mg/dL (ref 8.4–10.5)
Chloride: 99 meq/L (ref 96–112)
Creatinine, Ser: 0.88 mg/dL (ref 0.40–1.50)
GFR: 88.72 mL/min (ref >60.00)
Glucose, Bld: 110 mg/dL — ABNORMAL HIGH (ref 70–99)
Potassium: 4.3 meq/L (ref 3.5–5.1)
Sodium: 131 meq/L — ABNORMAL LOW (ref 135–145)

## 2024-01-15 LAB — HEPATIC FUNCTION PANEL
ALT: 24 U/L (ref 0–53)
AST: 25 U/L (ref 0–37)
Albumin: 4.6 g/dL (ref 3.5–5.2)
Alkaline Phosphatase: 68 U/L (ref 39–117)
Bilirubin, Direct: 0.2 mg/dL (ref 0.0–0.3)
Total Bilirubin: 1 mg/dL (ref 0.2–1.2)
Total Protein: 6.7 g/dL (ref 6.0–8.3)

## 2024-01-15 LAB — CBC WITH DIFFERENTIAL/PLATELET
Basophils Absolute: 0 10*3/uL (ref 0.0–0.1)
Basophils Relative: 0.7 % (ref 0.0–3.0)
Eosinophils Absolute: 0.1 10*3/uL (ref 0.0–0.7)
Eosinophils Relative: 2.1 % (ref 0.0–5.0)
HCT: 43.1 % (ref 39.0–52.0)
Hemoglobin: 14.6 g/dL (ref 13.0–17.0)
Lymphocytes Relative: 28.9 % (ref 12.0–46.0)
Lymphs Abs: 1.7 10*3/uL (ref 0.7–4.0)
MCHC: 33.9 g/dL (ref 30.0–36.0)
MCV: 94.6 fl (ref 78.0–100.0)
Monocytes Absolute: 0.6 10*3/uL (ref 0.1–1.0)
Monocytes Relative: 9.6 % (ref 3.0–12.0)
Neutro Abs: 3.4 10*3/uL (ref 1.4–7.7)
Neutrophils Relative %: 58.7 % (ref 43.0–77.0)
Platelets: 216 10*3/uL (ref 150.0–400.0)
RBC: 4.55 Mil/uL (ref 4.22–5.81)
RDW: 12.8 % (ref 11.5–15.5)
WBC: 5.7 10*3/uL (ref 4.0–10.5)

## 2024-01-15 LAB — LIPID PANEL
Cholesterol: 155 mg/dL (ref 0–200)
HDL: 54.3 mg/dL (ref >39.00)
LDL Cholesterol: 75 mg/dL (ref 0–99)
NonHDL: 100.64
Total CHOL/HDL Ratio: 3
Triglycerides: 126 mg/dL (ref 0.0–149.0)
VLDL: 25.2 mg/dL (ref 0.0–40.0)

## 2024-01-15 LAB — TSH: TSH: 1.67 u[IU]/mL (ref 0.35–5.50)

## 2024-01-15 LAB — PSA: PSA: 1.69 ng/mL (ref 0.10–4.00)

## 2024-01-15 LAB — CORTISOL: Cortisol, Plasma: 3.2 ug/dL

## 2024-01-15 LAB — HEMOGLOBIN A1C: Hgb A1c MFr Bld: 6.2 % (ref 4.6–6.5)

## 2024-01-15 MED ORDER — HYDROCORTISONE 10 MG PO TABS: 10.0000 mg | ORAL_TABLET | Freq: Every morning | ORAL | 0 refills | Status: DC

## 2024-01-15 MED ORDER — HYDROCORTISONE 5 MG PO TABS
5.0000 mg | ORAL_TABLET | Freq: Every evening | ORAL | 0 refills | Status: DC
Start: 2024-01-15 — End: 2024-05-14

## 2024-01-15 NOTE — Progress Notes (Unsigned)
 Subjective:  Patient ID: [name], [name]    DOB: 1955-09-13  Age: 69 y.o. MRN: [id number]  CC: Annual Exam, Hypertension, and Hyperlipidemia   HPI [name] Cooper for Phillip CPX and f/up ---  Discussed the use of AI scribe software for clinical note transcription with the patient, who gave verbal consent to proceed.  History of Present Illness   [name] is Phillip 68 year old [name] who Cooper with an abnormal EKG and urinary symptoms.  He has been feeling well without dizziness or lightheadedness. He has not had lab work done in Phillip year and Phillip half, and no other doctor has conducted lab work since his last visit. Phillip previous EKG showed left axis deviation and infarction of undetermined age. He is currently taking multiple medications, including olmesartan . He has not been monitoring his blood pressure or blood sugar at home. No swelling in his legs or feet, abdominal pain, or recent events that could have caused his blood pressure to drop. He did not fast before the visit, having had Phillip large breakfast.  Regarding urinary symptoms, he experiences nocturia, needing to urinate three to four times at night, but has no issues during the day. No straining, painful urination, or urgency.       Outpatient Medications Prior to Visit  Medication Sig Dispense Refill   amoxicillin  (AMOXIL ) 500 MG capsule Take 1 capsule (500 mg total) by mouth daily. 90 capsule 0   aspirin  EC 81 MG tablet Take 1 tablet (81 mg total) by mouth daily. Swallow whole. 30 tablet 11   atorvastatin  (LIPITOR) 80 MG tablet Take 1 tablet (80 mg total) by mouth daily. 90 tablet 0   DULoxetine (CYMBALTA) 60 MG capsule Take 60 mg by mouth daily.     dutasteride  (AVODART ) 0.5 MG capsule Take 1 capsule (0.5 mg total) by mouth daily. 90 capsule 0   Fish Oil-Cholecalciferol (FISH OIL + D3 PO) Take 1 capsule by mouth daily.     fluorouracil (EFUDEX) 5 % cream Apply topically as needed.     hydrocortisone 2.5 % cream Apply 1  Application topically 2 (two) times daily.     lamoTRIgine (LAMICTAL) 150 MG tablet Take 150 mg by mouth 2 (two) times daily.  1   mesalamine  (LIALDA ) 1.2 g EC tablet Take 2 tablets (2.4 g total) by mouth daily. 150 tablet 3   Multiple Vitamin (MULTIVITAMIN) tablet Take 1 tablet by mouth daily.     QUEtiapine (SEROQUEL) 200 MG tablet Take 200 mg by mouth at bedtime.     silodosin  (RAPAFLO ) 8 MG CAPS capsule Take 1 capsule (8 mg total) by mouth daily. 90 capsule 0   olmesartan  (BENICAR ) 40 MG tablet Take 1 tablet (40 mg total) by mouth daily. 90 tablet 0   No facility-administered medications prior to visit.    ROS Review of Systems  Objective:  BP 94/60 (BP Location: Left Arm, Patient Position: Sitting, Cuff Size: Normal)   Pulse 92   Temp 98.2 F (36.8 C) (Oral)   Ht 6\' 2"  (1.88 m)   Wt 230 lb 6.4 oz (104.5 kg)   SpO2 96%   BMI 29.58 kg/m   BP Readings from Last 3 Encounters:  01/15/24 94/60  01/09/24 102/68  06/03/23 138/72    Wt Readings from Last 3 Encounters:  01/15/24 230 lb 6.4 oz (104.5 kg)  01/09/24 233 lb 3.2 oz (105.8 kg)  06/03/23 233 lb (105.7 kg)    Physical Exam  Lab Results  Component Value Date   WBC 5.7 01/15/2024   HGB 14.6 01/15/2024   HCT 43.1 01/15/2024   PLT 216.0 01/15/2024   GLUCOSE 110 (H) 01/15/2024   CHOL 155 01/15/2024   TRIG 126.0 01/15/2024   HDL 54.30 01/15/2024   LDLDIRECT 102.0 09/09/2017   LDLCALC 75 01/15/2024   ALT 24 01/15/2024   AST 25 01/15/2024   NA 131 (L) 01/15/2024   K 4.3 01/15/2024   CL 99 01/15/2024   CREATININE 0.88 01/15/2024   BUN 10 01/15/2024   CO2 24 01/15/2024   TSH 1.67 01/15/2024   PSA 1.69 01/15/2024   INR 0.9 03/08/2008   HGBA1C 6.2 01/15/2024    CT CARDIAC SCORING ([facility] LOCATIONS ONLY) Result Date: 06/14/2021 CLINICAL DATA:  68 year old Caucasian [name] with history of hyperlipidemia and hypertension. EXAM: CT CARDIAC CORONARY ARTERY CALCIUM  SCORE TECHNIQUE: Non-contrast imaging through the  heart was performed using prospective ECG gating. Image post processing was performed on an independent workstation, allowing for quantitative analysis of the heart and coronary arteries. Note that this exam targets the heart and the chest was not imaged in its entirety. COMPARISON:  None. FINDINGS: CORONARY CALCIUM  SCORES: Left Main: 29.4 LAD: 407 LCx: 0 RCA: 0.4 Total Agatston Score: 437 MESA database percentile: 81 AORTA MEASUREMENTS: Ascending Aorta: 35 mm Descending Aorta: 25 mm OTHER FINDINGS: The heart size is within normal limits. No pericardial fluid is identified. Visualized segments of the thoracic aorta and central pulmonary arteries are normal in caliber. Visualized mediastinum and hilar regions demonstrate no lymphadenopathy or masses. Visualized lungs show no evidence of pulmonary edema, consolidation, pneumothorax, nodule or pleural fluid. Visualized upper abdomen and bony structures are unremarkable. IMPRESSION: Coronary calcium  score of 437 is at the 81st percentile for the patient's age, sex and race. Electronically Signed   By: Erica Hau M.D.   On: 06/14/2021 16:14    Assessment & Plan:  Encounter for general adult medical examination with abnormal findings  Chronic hyponatremia -     Sodium, urine, random; Future -     Basic metabolic panel with GFR; Future  Essential hypertension -     TSH; Future -     Urinalysis, Routine w reflex microscopic; Future -     CBC with Differential/Platelet; Future -     Basic metabolic panel with GFR; Future  Prediabetes -     Hemoglobin A1c; Future -     Basic metabolic panel with GFR; Future  Dyslipidemia, goal LDL below 100 -     Lipid panel; Future  Idiopathic hypotension -     Cortisol; Future -     TSH; Future -     Urinalysis, Routine w reflex microscopic; Future -     Hepatic function panel; Future -     CBC with Differential/Platelet; Future -     Basic metabolic panel with GFR; Future -     ACTH; Future  Benign  prostatic hyperplasia with urinary hesitancy -     Urinalysis, Routine w reflex microscopic; Future -     PSA; Future  Adrenal insufficiency (HCC) -     ACTH; Future     Follow-up: Return in about 4 weeks (around 02/12/2024).  Sandra Crouch, MD

## 2024-01-15 NOTE — Patient Instructions (Signed)
 Health Maintenance, Male  Adopting a healthy lifestyle and getting preventive care are important in promoting health and wellness. Ask your health care provider about:  The right schedule for you to have regular tests and exams.  Things you can do on your own to prevent diseases and keep yourself healthy.  What should I know about diet, weight, and exercise?  Eat a healthy diet    Eat a diet that includes plenty of vegetables, fruits, low-fat dairy products, and lean protein.  Do not eat a lot of foods that are high in solid fats, added sugars, or sodium.  Maintain a healthy weight  Body mass index (BMI) is a measurement that can be used to identify possible weight problems. It estimates body fat based on height and weight. Your health care provider can help determine your BMI and help you achieve or maintain a healthy weight.  Get regular exercise  Get regular exercise. This is one of the most important things you can do for your health. Most adults should:  Exercise for at least 150 minutes each week. The exercise should increase your heart rate and make you sweat (moderate-intensity exercise).  Do strengthening exercises at least twice a week. This is in addition to the moderate-intensity exercise.  Spend less time sitting. Even light physical activity can be beneficial.  Watch cholesterol and blood lipids  Have your blood tested for lipids and cholesterol at 68 years of age, then have this test every 5 years.  You may need to have your cholesterol levels checked more often if:  Your lipid or cholesterol levels are high.  You are older than 68 years of age.  You are at high risk for heart disease.  What should I know about cancer screening?  Many types of cancers can be detected early and may often be prevented. Depending on your health history and family history, you may need to have cancer screening at various ages. This may include screening for:  Colorectal cancer.  Prostate cancer.  Skin cancer.  Lung  cancer.  What should I know about heart disease, diabetes, and high blood pressure?  Blood pressure and heart disease  High blood pressure causes heart disease and increases the risk of stroke. This is more likely to develop in people who have high blood pressure readings or are overweight.  Talk with your health care provider about your target blood pressure readings.  Have your blood pressure checked:  Every 3-5 years if you are 9-95 years of age.  Every year if you are 85 years old or older.  If you are between the ages of 29 and 29 and are a current or former smoker, ask your health care provider if you should have a one-time screening for abdominal aortic aneurysm (AAA).  Diabetes  Have regular diabetes screenings. This checks your fasting blood sugar level. Have the screening done:  Once every three years after age 23 if you are at a normal weight and have a low risk for diabetes.  More often and at a younger age if you are overweight or have a high risk for diabetes.  What should I know about preventing infection?  Hepatitis B  If you have a higher risk for hepatitis B, you should be screened for this virus. Talk with your health care provider to find out if you are at risk for hepatitis B infection.  Hepatitis C  Blood testing is recommended for:  Everyone born from [birth year] through 1965.  Anyone  with known risk factors for hepatitis C.  Sexually transmitted infections (STIs)  You should be screened each year for STIs, including gonorrhea and chlamydia, if:  You are sexually active and are younger than 68 years of age.  You are older than 68 years of age and your health care provider tells you that you are at risk for this type of infection.  Your sexual activity has changed since you were last screened, and you are at increased risk for chlamydia or gonorrhea. Ask your health care provider if you are at risk.  Ask your health care provider about whether you are at high risk for HIV. Your health care provider  may recommend a prescription medicine to help prevent HIV infection. If you choose to take medicine to prevent HIV, you should first get tested for HIV. You should then be tested every 3 months for as long as you are taking the medicine.  Follow these instructions at home:  Alcohol use  Do not drink alcohol if your health care provider tells you not to drink.  If you drink alcohol:  Limit how much you have to 0-2 drinks a day.  Know how much alcohol is in your drink. In the [place] one drink equals one 12 oz bottle of beer (355 mL), one 5 oz glass of wine (148 mL), or one 1 oz glass of hard liquor (44 mL).  Lifestyle  Do not use any products that contain nicotine or tobacco. These products include cigarettes, chewing tobacco, and vaping devices, such as e-cigarettes. If you need help quitting, ask your health care provider.  Do not use street drugs.  Do not share needles.  Ask your health care provider for help if you need support or information about quitting drugs.  General instructions  Schedule regular health, dental, and eye exams.  Stay current with your vaccines.  Tell your health care provider if:  You often feel depressed.  You have ever been abused or do not feel safe at home.  Summary  Adopting a healthy lifestyle and getting preventive care are important in promoting health and wellness.  Follow your health care provider's instructions about healthy diet, exercising, and getting tested or screened for diseases.  Follow your health care provider's instructions on monitoring your cholesterol and blood pressure.  This information is not intended to replace advice given to you by your health care provider. Make sure you discuss any questions you have with your health care provider.  Document Revised: 01/09/2021 Document Reviewed: 01/09/2021  Elsevier Patient Education  2024 [employer].

## 2024-01-16 ENCOUNTER — Encounter: Payer: Self-pay | Admitting: Internal Medicine

## 2024-01-20 ENCOUNTER — Other Ambulatory Visit: Payer: Self-pay

## 2024-01-20 ENCOUNTER — Other Ambulatory Visit (INDEPENDENT_AMBULATORY_CARE_PROVIDER_SITE_OTHER)

## 2024-01-20 DIAGNOSIS — R3911 Hesitancy of micturition: Secondary | ICD-10-CM

## 2024-01-20 DIAGNOSIS — I950 Idiopathic hypotension: Secondary | ICD-10-CM | POA: Diagnosis not present

## 2024-01-20 DIAGNOSIS — I10 Essential (primary) hypertension: Secondary | ICD-10-CM

## 2024-01-20 DIAGNOSIS — E871 Hypo-osmolality and hyponatremia: Secondary | ICD-10-CM

## 2024-01-20 DIAGNOSIS — N401 Enlarged prostate with lower urinary tract symptoms: Secondary | ICD-10-CM

## 2024-01-20 DIAGNOSIS — E2740 Unspecified adrenocortical insufficiency: Secondary | ICD-10-CM

## 2024-01-20 LAB — URINALYSIS, ROUTINE W REFLEX MICROSCOPIC
Bilirubin Urine: NEGATIVE
Hgb urine dipstick: NEGATIVE
Ketones, ur: NEGATIVE
Leukocytes,Ua: NEGATIVE
Nitrite: NEGATIVE
Specific Gravity, Urine: 1.02 (ref 1.000–1.030)
Total Protein, Urine: NEGATIVE
Urine Glucose: NEGATIVE
Urobilinogen, UA: 0.2 (ref 0.0–1.0)
pH: 6 (ref 5.0–8.0)

## 2024-01-20 NOTE — Telephone Encounter (Signed)
 Left axis deviation and consider infarct are based on EKG findings / axis/ EKG sticker placement, obesity/COPD and many other factors can effect true representation. It provides guidance but not always accurate.   He had a stress test in 2023 which noted normal perfusion and low risk study.   Remainder we can discuss at the next office visit.   Timara Loma Allenton, DO, FACC

## 2024-01-21 ENCOUNTER — Encounter: Payer: Self-pay | Admitting: Internal Medicine

## 2024-01-21 LAB — SODIUM, URINE, RANDOM: Sodium, Ur: 89 mmol/L (ref 28–272)

## 2024-01-22 LAB — ACTH: C206 ACTH: 10 pg/mL (ref 6–50)

## 2024-02-19 ENCOUNTER — Ambulatory Visit (HOSPITAL_COMMUNITY)
Admission: RE | Admit: 2024-02-19 | Discharge: 2024-02-19 | Disposition: A | Discharge status: Home / Self Care | Source: Ambulatory Visit | Attending: Cardiology | Admitting: Cardiology

## 2024-02-19 DIAGNOSIS — I2510 Atherosclerotic heart disease of native coronary artery without angina pectoris: Secondary | ICD-10-CM | POA: Diagnosis not present

## 2024-02-19 DIAGNOSIS — I2584 Coronary atherosclerosis due to calcified coronary lesion: Secondary | ICD-10-CM | POA: Diagnosis not present

## 2024-02-19 DIAGNOSIS — I10 Essential (primary) hypertension: Secondary | ICD-10-CM | POA: Diagnosis not present

## 2024-02-19 LAB — ECHOCARDIOGRAM COMPLETE
Area-P 1/2: 1.89 cm2
S' Lateral: 3.29 cm

## 2024-02-26 ENCOUNTER — Ambulatory Visit: Payer: Self-pay | Admitting: Cardiology

## 2024-02-26 DIAGNOSIS — I2584 Coronary atherosclerosis due to calcified coronary lesion: Secondary | ICD-10-CM

## 2024-03-04 DIAGNOSIS — F3181 Bipolar II disorder: Secondary | ICD-10-CM | POA: Diagnosis not present

## 2024-03-23 ENCOUNTER — Ambulatory Visit: Admitting: Cardiology

## 2024-04-02 DIAGNOSIS — L821 Other seborrheic keratosis: Secondary | ICD-10-CM | POA: Diagnosis not present

## 2024-04-02 DIAGNOSIS — Z85828 Personal history of other malignant neoplasm of skin: Secondary | ICD-10-CM | POA: Diagnosis not present

## 2024-04-02 DIAGNOSIS — L570 Actinic keratosis: Secondary | ICD-10-CM | POA: Diagnosis not present

## 2024-04-08 DIAGNOSIS — F3181 Bipolar II disorder: Secondary | ICD-10-CM | POA: Diagnosis not present

## 2024-05-07 ENCOUNTER — Encounter: Payer: Self-pay | Admitting: Cardiology

## 2024-05-07 NOTE — Telephone Encounter (Signed)
 I defer initiating Ozempic to PCP.   There certain comorbid conditions for which Ozempic will be approved for by the insurance.  Hope that helps.   Zema Lizardo [place], DO, FACC

## 2024-05-08 ENCOUNTER — Encounter: Payer: Self-pay | Admitting: Internal Medicine

## 2024-05-11 ENCOUNTER — Encounter: Payer: Self-pay | Admitting: Internal Medicine

## 2024-05-13 ENCOUNTER — Encounter: Payer: Self-pay | Admitting: Internal Medicine

## 2024-05-13 DIAGNOSIS — F3181 Bipolar II disorder: Secondary | ICD-10-CM | POA: Diagnosis not present

## 2024-05-14 ENCOUNTER — Ambulatory Visit: Admitting: Internal Medicine

## 2024-05-14 ENCOUNTER — Encounter: Payer: Self-pay | Admitting: Internal Medicine

## 2024-05-14 VITALS — BP 148/90 | HR 77 | Temp 97.9°F | Resp 16 | Ht 74.0 in | Wt 229.0 lb

## 2024-05-14 DIAGNOSIS — E871 Hypo-osmolality and hyponatremia: Secondary | ICD-10-CM

## 2024-05-14 DIAGNOSIS — E2740 Unspecified adrenocortical insufficiency: Secondary | ICD-10-CM

## 2024-05-14 DIAGNOSIS — R4789 Other speech disturbances: Secondary | ICD-10-CM

## 2024-05-14 DIAGNOSIS — Z23 Encounter for immunization: Secondary | ICD-10-CM | POA: Diagnosis not present

## 2024-05-14 DIAGNOSIS — I10 Essential (primary) hypertension: Secondary | ICD-10-CM

## 2024-05-14 DIAGNOSIS — E785 Hyperlipidemia, unspecified: Secondary | ICD-10-CM

## 2024-05-14 DIAGNOSIS — R7303 Prediabetes: Secondary | ICD-10-CM | POA: Diagnosis not present

## 2024-05-14 LAB — URINALYSIS, ROUTINE W REFLEX MICROSCOPIC
Bilirubin Urine: NEGATIVE
Hgb urine dipstick: NEGATIVE
Leukocytes,Ua: NEGATIVE
Nitrite: NEGATIVE
Specific Gravity, Urine: 1.02 (ref 1.000–1.030)
Total Protein, Urine: NEGATIVE
Urine Glucose: NEGATIVE
Urobilinogen, UA: 1 (ref 0.0–1.0)
pH: 6 (ref 5.0–8.0)

## 2024-05-14 LAB — BASIC METABOLIC PANEL WITH GFR
BUN: 11 mg/dL (ref 6–23)
CO2: 27 meq/L (ref 19–32)
Calcium: 9.6 mg/dL (ref 8.4–10.5)
Chloride: 96 meq/L (ref 96–112)
Creatinine, Ser: 0.8 mg/dL (ref 0.40–1.50)
GFR: 91.1 mL/min (ref >60.00)
Glucose, Bld: 111 mg/dL — ABNORMAL HIGH (ref 70–99)
Potassium: 3.7 meq/L (ref 3.5–5.1)
Sodium: 131 meq/L — ABNORMAL LOW (ref 135–145)

## 2024-05-14 LAB — HEMOGLOBIN A1C: Hgb A1c MFr Bld: 6.3 % (ref 4.6–6.5)

## 2024-05-14 MED ORDER — HYDROCORTISONE 5 MG PO TABS: 5.0000 mg | ORAL_TABLET | Freq: Two times a day (BID) | ORAL | 0 refills | Status: DC

## 2024-05-14 MED ORDER — ATORVASTATIN CALCIUM 80 MG PO TABS: 80.0000 mg | ORAL_TABLET | Freq: Every day | ORAL | 0 refills | Status: DC

## 2024-05-14 MED ORDER — COVID-19 MRNA VAC-TRIS(PFIZER) 30 MCG/0.3ML IM SUSY: 0.3000 mL | PREFILLED_SYRINGE | Freq: Once | INTRAMUSCULAR | 0 refills | Status: AC

## 2024-05-14 MED ORDER — OLMESARTAN MEDOXOMIL 20 MG PO TABS: 20.0000 mg | ORAL_TABLET | Freq: Every day | ORAL | 0 refills | Status: DC

## 2024-05-14 NOTE — Progress Notes (Addendum)
 Subjective:  Patient ID: [name], male    DOB: Dec 05, 1955  Age: 68 y.o. MRN: [id number]  CC: Hypertension   HPI [name] presents for f/up -----  Discussed the use of AI scribe software for clinical note transcription with the patient, who gave verbal consent to proceed.  History of Present Illness [name] is a 68 year old male who presents for a follow-up regarding previous concussions and alcohol use.  He has a history of concussions and alcohol use but currently denies any ongoing symptoms related to these past events. No memory issues, numbness, weakness, tingling, headaches, vision problems, or balance issues are present. He he complains of word finding difficulty.  He is currently taking hydrocortisone  but has stopped taking olmesartan . His echocardiogram was reportedly normal according to Dr. Michele, and the patient was told he would be seen again in a year. No history of heart murmur is reported.  He experiences difficulty urinating at night but denies painful urination, stinging, or hematuria. He is taking a reduced dose of dutasteride .  No symptoms of high blood pressure such as headache, blurred vision, chest pain, shortness of breath, dizziness, or lightheadedness are reported.  I know we discussed this before but I'd like to address it again tomorrow. I had two concussions in [place] prior to the age of 43. Add in my alcoholism and Bipolar II and I'd really like to have a radiologist do an MRI of my noggin. I do not feel any effects but would rather be safe than sorry. My insurance does not require a referral for a specialist (which surprises me) but my guess is that a radiologist would prefer I come to them with a referral from you.    Outpatient Medications Prior to Visit  Medication Sig Dispense Refill   amoxicillin  (AMOXIL ) 500 MG capsule Take 1 capsule (500 mg total) by mouth daily. 90 capsule 0   aspirin  EC 81 MG tablet Take 1 tablet (81 mg  total) by mouth daily. Swallow whole. 30 tablet 11   DULoxetine (CYMBALTA) 60 MG capsule Take 60 mg by mouth daily.     dutasteride  (AVODART ) 0.5 MG capsule Take 1 capsule (0.5 mg total) by mouth daily. 90 capsule 0   Fish Oil-Cholecalciferol (FISH OIL + D3 PO) Take 1 capsule by mouth daily.     fluorouracil (EFUDEX) 5 % cream Apply topically as needed.     hydrocortisone  2.5 % cream Apply 1 Application topically 2 (two) times daily.     lamoTRIgine (LAMICTAL) 150 MG tablet Take 150 mg by mouth 2 (two) times daily.  1   mesalamine  (LIALDA ) 1.2 g EC tablet Take 2 tablets (2.4 g total) by mouth daily. 150 tablet 3   Multiple Vitamin (MULTIVITAMIN) tablet Take 1 tablet by mouth daily.     QUEtiapine (SEROQUEL) 200 MG tablet Take 200 mg by mouth at bedtime.     silodosin  (RAPAFLO ) 8 MG CAPS capsule Take 1 capsule (8 mg total) by mouth daily. 90 capsule 0   atorvastatin  (LIPITOR) 80 MG tablet Take 1 tablet (80 mg total) by mouth daily. 90 tablet 0   hydrocortisone  (CORTEF ) 10 MG tablet Take 1 tablet (10 mg total) by mouth every morning. 90 tablet 0   hydrocortisone  (CORTEF ) 5 MG tablet Take 1 tablet (5 mg total) by mouth every evening. 90 tablet 0   No facility-administered medications prior to visit.    ROS Review of Systems  Constitutional:  Negative for appetite change, chills, diaphoresis,  fatigue and fever.  HENT: Negative.  Negative for sore throat and trouble swallowing.   Eyes:  Negative for photophobia and visual disturbance.  Respiratory: Negative.  Negative for cough, chest tightness, shortness of breath and wheezing.   Cardiovascular:  Negative for chest pain, palpitations and leg swelling.  Gastrointestinal:  Negative for abdominal pain, constipation, diarrhea, nausea and vomiting.  Endocrine: Negative for polyuria.  Genitourinary:  Positive for difficulty urinating. Negative for flank pain, frequency and hematuria.  Musculoskeletal:  Negative for arthralgias, joint swelling and  myalgias.  Skin: Negative.   Neurological:  Negative for dizziness, weakness, light-headedness and headaches.  Hematological:  Negative for adenopathy. Does not bruise/bleed easily.  Psychiatric/Behavioral:  Positive for confusion. Negative for behavioral problems, dysphoric mood, hallucinations and suicidal ideas.     Objective:  BP (!) 148/90 (BP Location: Left Arm, Patient Position: Sitting)   Pulse 77   Temp 97.9 F (36.6 C) (Temporal)   Resp 16   Ht 6' 2 (1.88 m)   Wt 229 lb (103.9 kg)   SpO2 99%   BMI 29.40 kg/m   BP Readings from Last 3 Encounters:  05/14/24 (!) 148/90  01/15/24 94/60  01/09/24 102/68    Wt Readings from Last 3 Encounters:  05/14/24 229 lb (103.9 kg)  01/15/24 230 lb 6.4 oz (104.5 kg)  01/09/24 233 lb 3.2 oz (105.8 kg)    Physical Exam Vitals reviewed.  Constitutional:      Appearance: Normal appearance.  HENT:     Nose: Nose normal.     Mouth/Throat:     Mouth: Mucous membranes are moist.  Eyes:     General: No scleral icterus.    Conjunctiva/sclera: Conjunctivae normal.  Cardiovascular:     Rate and Rhythm: Normal rate and regular rhythm.     Heart sounds: No murmur heard.    No friction rub. No gallop.  Pulmonary:     Effort: Pulmonary effort is normal.     Breath sounds: No stridor. No wheezing, rhonchi or rales.  Abdominal:     General: Abdomen is flat.     Palpations: There is no mass.     Tenderness: There is no abdominal tenderness. There is no guarding.     Hernia: No hernia is present.  Musculoskeletal:        General: No deformity.     Cervical back: Neck supple.     Right lower leg: No edema.     Left lower leg: No edema.  Lymphadenopathy:     Cervical: No cervical adenopathy.  Skin:    General: Skin is warm and dry.     Findings: No rash.  Neurological:     General: No focal deficit present.     Mental Status: He is alert. Mental status is at baseline.  Psychiatric:        Mood and Affect: Mood normal.         Behavior: Behavior normal.     Lab Results  Component Value Date   WBC 5.7 01/15/2024   HGB 14.6 01/15/2024   HCT 43.1 01/15/2024   PLT 216.0 01/15/2024   GLUCOSE 111 (H) 05/14/2024   CHOL 155 01/15/2024   TRIG 126.0 01/15/2024   HDL 54.30 01/15/2024   LDLDIRECT 102.0 09/09/2017   LDLCALC 75 01/15/2024   ALT 24 01/15/2024   AST 25 01/15/2024   NA 131 (L) 05/14/2024   K 3.7 05/14/2024   CL 96 05/14/2024   CREATININE 0.80 05/14/2024   BUN  11 05/14/2024   CO2 27 05/14/2024   TSH 1.67 01/15/2024   PSA 1.69 01/15/2024   INR 0.9 03/08/2008   HGBA1C 6.3 05/14/2024    ECHOCARDIOGRAM COMPLETE Result Date: 02/19/2024    ECHOCARDIOGRAM REPORT   Patient Name:   Kahner Dobos Date of Exam: 02/19/2024 Medical Rec #:  [id number]      Height:       74.0 in Accession #:    [id number]     Weight:       230.4 lb Date of Birth:  02/19/1956      BSA:          2.308 m Patient Age:    68 years       BP:           102/68 mmHg Patient Gender: M              HR:           77 bpm. Exam Location:  [address] Procedure: 2D Echo, 3D Echo and Strain Analysis (Both Spectral and Color Flow            Doppler were utilized during procedure). Indications:    I25.10 Coronary artery disease ; I10 Hypertension  History:        Patient has prior history of Echocardiogram examinations, most                 recent 03/23/2021. Arrythmias:PVC; Risk Factors:Dyslipidemia.                 Prediabetes. Idiopathic hypotension.  Sonographer:    Jon Hacker RCS Referring Phys: [id number] MADONNA LARGE IMPRESSIONS  1. Left ventricular ejection fraction, by estimation, is 60 to 65%. Left ventricular ejection fraction by 3D volume is 62 %. The left ventricle has normal function. The left ventricle has no regional wall motion abnormalities. There is mild concentric left ventricular hypertrophy. Left ventricular diastolic parameters are consistent with Grade I diastolic dysfunction (impaired relaxation). The average left ventricular global  longitudinal strain is -23.0 %. The global longitudinal strain is normal.  2. Right ventricular systolic function is normal. The right ventricular size is normal.  3. Left atrial size was mildly dilated.  4. The mitral valve is normal in structure. Trivial mitral valve regurgitation. No evidence of mitral stenosis.  5. The aortic valve is normal in structure. Aortic valve regurgitation is trivial. No aortic stenosis is present.  6. Aortic dilatation noted. There is mild dilatation of the aortic root, measuring 40 mm. There is borderline dilatation of the ascending aorta, measuring 39 mm.  7. The inferior vena cava is normal in size with greater than 50% respiratory variability, suggesting right atrial pressure of 3 mmHg. FINDINGS  Left Ventricle: Left ventricular ejection fraction, by estimation, is 60 to 65%. Left ventricular ejection fraction by 3D volume is 62 %. The left ventricle has normal function. The left ventricle has no regional wall motion abnormalities. The average left ventricular global longitudinal strain is -23.0 %. Strain was performed and the global longitudinal strain is normal. The left ventricular internal cavity size was normal in size. There is mild concentric left ventricular hypertrophy. Left ventricular diastolic parameters are consistent with Grade I diastolic dysfunction (impaired relaxation). Right Ventricle: The right ventricular size is normal. No increase in right ventricular wall thickness. Right ventricular systolic function is normal. Left Atrium: Left atrial size was mildly dilated. Right Atrium: Right atrial size was normal in size. Pericardium: There is no evidence of pericardial  effusion. Mitral Valve: The mitral valve is normal in structure. Trivial mitral valve regurgitation. No evidence of mitral valve stenosis. Tricuspid Valve: The tricuspid valve is normal in structure. Tricuspid valve regurgitation is trivial. No evidence of tricuspid stenosis. Aortic Valve: The aortic  valve is normal in structure. Aortic valve regurgitation is trivial. No aortic stenosis is present. Pulmonic Valve: The pulmonic valve was normal in structure. Pulmonic valve regurgitation is trivial. No evidence of pulmonic stenosis. Aorta: Aortic dilatation noted. There is mild dilatation of the aortic root, measuring 40 mm. There is borderline dilatation of the ascending aorta, measuring 39 mm. Venous: The inferior vena cava is normal in size with greater than 50% respiratory variability, suggesting right atrial pressure of 3 mmHg. IAS/Shunts: No atrial level shunt detected by color flow Doppler. Additional Comments: 3D was performed not requiring image post processing on an independent workstation and was normal.  LEFT VENTRICLE PLAX 2D LVIDd:         5.24 cm         Diastology LVIDs:         3.29 cm         LV e' medial:    6.31 cm/s LV PW:         1.20 cm         LV E/e' medial:  8.8 LV IVS:        1.09 cm         LV e' lateral:   11.10 cm/s LVOT diam:     2.50 cm         LV E/e' lateral: 5.0 LV SV:         97 LV SV Index:   42              2D Longitudinal LVOT Area:     4.91 cm        Strain                                2D Strain GLS   -22.9 %                                (A4C):                                2D Strain GLS   -21.7 %                                (A3C):                                2D Strain GLS   -24.4 %                                (A2C):                                2D Strain GLS   -23.0 %  Avg:                                 3D Volume EF                                LV 3D EF:    Left                                             ventricul                                             ar                                             ejection                                             fraction                                             by 3D                                             volume is                                             62 %.                                  3D Volume EF:                                3D EF:        62 %                                LV EDV:       221 ml                                LV ESV:       85 ml                                LV SV:        137 ml RIGHT VENTRICLE RV Basal diam:  3.75 cm RV S prime:     12.50 cm/s TAPSE (M-mode): 1.3 cm LEFT ATRIUM             Index        RIGHT ATRIUM           Index LA diam:        4.40 cm 1.91 cm/m   RA Area:     15.20 cm LA Vol (A2C):   73.9 ml 32.02 ml/m  RA Volume:   38.60 ml  16.72 ml/m LA Vol (A4C):   68.2 ml 29.55 ml/m LA Biplane Vol: 72.3 ml 31.32 ml/m  AORTIC VALVE LVOT Vmax:   99.00 cm/s LVOT Vmean:  66.600 cm/s LVOT VTI:    0.198 m  AORTA Ao Root diam: 4.00 cm Ao Asc diam:  3.90 cm MITRAL VALVE MV Area (PHT): 1.89 cm    SHUNTS MV Decel Time: 402 msec    Systemic VTI:  0.20 m MV E velocity: 55.50 cm/s  Systemic Diam: 2.50 cm MV A velocity: 62.40 cm/s MV E/A ratio:  0.89 Toribio Fuel MD Electronically signed by Toribio Fuel MD Signature Date/Time: 02/19/2024/8:14:34 AM    Final    Fibrosis 4 Score = 1.61  Fib-4 interpretation is not validated for people under 35 or over 38 years of age. However, scores under 2.0 are generally considered low risk.   Assessment & Plan:  Need for influenza vaccination -     Flu vaccine HIGH DOSE PF(Fluzone Trivalent)  Adrenal insufficiency (HCC)- BP is high. Will lower the dose of hydrocortisone . -     Basic metabolic panel with GFR; Future -     ACTH ; Future -     Hydrocortisone ; Take 1 tablet (5 mg total) by mouth 2 (two) times daily.  Dispense: 180 tablet; Refill: 0 -     AMB Referral [facility] Care Management  Word finding difficulty -     MR BRAIN WO CONTRAST; Future  Chronic hyponatremia -     Urinalysis, Routine w reflex microscopic; Future -     Sodium, urine, random; Future  Essential hypertension- BP is not at goal. Will restart the ARB. -     Urinalysis, Routine w reflex microscopic; Future -      Olmesartan  Medoxomil; Take 1 tablet (20 mg total) by mouth daily.  Dispense: 90 tablet; Refill: 0 -     AMB Referral [facility]BCI Care Management  Prediabetes -     Hemoglobin A1c; Future  Dyslipidemia, goal LDL below 100 -     Atorvastatin  Calcium ; Take 1 tablet (80 mg total) by mouth daily.  Dispense: 90 tablet; Refill: 0  Other orders -     COVID-19 mRNA Vac-TriS(Pfizer); Inject 0.3 mLs into the muscle once for 1 dose.  Dispense: 0.3 mL; Refill: 0     Follow-up: Return in about 6 months (around 11/11/2024).  Debby Molt, MD

## 2024-05-14 NOTE — Patient Instructions (Signed)
 Hypertension, Adult High blood pressure (hypertension) is when the force of blood pumping through the arteries is too strong. The arteries are the blood vessels that carry blood from the heart throughout the body. Hypertension forces the heart to work harder to pump blood and may cause arteries to become narrow or stiff. Untreated or uncontrolled hypertension can lead to a heart attack, heart failure, a stroke, kidney disease, and other problems. A blood pressure reading consists of a higher number over a lower number. Ideally, your blood pressure should be below 120/80. The first ("top") number is called the systolic pressure. It is a measure of the pressure in your arteries as your heart beats. The second ("bottom") number is called the diastolic pressure. It is a measure of the pressure in your arteries as the heart relaxes. What are the causes? The exact cause of this condition is not known. There are some conditions that result in high blood pressure. What increases the risk? Certain factors may make you more likely to develop high blood pressure. Some of these risk factors are under your control, including: Smoking. Not getting enough exercise or physical activity. Being overweight. Having too much fat, sugar, calories, or salt (sodium) in your diet. Drinking too much alcohol. Other risk factors include: Having a personal history of heart disease, diabetes, high cholesterol, or kidney disease. Stress. Having a family history of high blood pressure and high cholesterol. Having obstructive sleep apnea. Age. The risk increases with age. What are the signs or symptoms? High blood pressure may not cause symptoms. Very high blood pressure (hypertensive crisis) may cause: Headache. Fast or irregular heartbeats (palpitations). Shortness of breath. Nosebleed. Nausea and vomiting. Vision changes. Severe chest pain, dizziness, and seizures. How is this diagnosed? This condition is diagnosed by  measuring your blood pressure while you are seated, with your arm resting on a flat surface, your legs uncrossed, and your feet flat on the floor. The cuff of the blood pressure monitor will be placed directly against the skin of your upper arm at the level of your heart. Blood pressure should be measured at least twice using the same arm. Certain conditions can cause a difference in blood pressure between your right and left arms. If you have a high blood pressure reading during one visit or you have normal blood pressure with other risk factors, you may be asked to: Return on a different day to have your blood pressure checked again. Monitor your blood pressure at home for 1 week or longer. If you are diagnosed with hypertension, you may have other blood or imaging tests to help your health care provider understand your overall risk for other conditions. How is this treated? This condition is treated by making healthy lifestyle changes, such as eating healthy foods, exercising more, and reducing your alcohol intake. You may be referred for counseling on a healthy diet and physical activity. Your health care provider may prescribe medicine if lifestyle changes are not enough to get your blood pressure under control and if: Your systolic blood pressure is above 130. Your diastolic blood pressure is above 80. Your personal target blood pressure may vary depending on your medical conditions, your age, and other factors. Follow these instructions at home: Eating and drinking  Eat a diet that is high in fiber and potassium, and low in sodium, added sugar, and fat. An example of this eating plan is called the DASH diet. DASH stands for Dietary Approaches to Stop Hypertension. To eat this way: Eat  plenty of fresh fruits and vegetables. Try to fill one half of your plate at each meal with fruits and vegetables. Eat whole grains, such as whole-wheat pasta, brown rice, or whole-grain bread. Fill about one  fourth of your plate with whole grains. Eat or drink low-fat dairy products, such as skim milk or low-fat yogurt. Avoid fatty cuts of meat, processed or cured meats, and poultry with skin. Fill about one fourth of your plate with lean proteins, such as fish, chicken without skin, beans, eggs, or tofu. Avoid pre-made and processed foods. These tend to be higher in sodium, added sugar, and fat. Reduce your daily sodium intake. Many people with hypertension should eat less than 1,500 mg of sodium a day. Do not drink alcohol if: Your health care provider tells you not to drink. You are pregnant, may be pregnant, or are planning to become pregnant. If you drink alcohol: Limit how much you have to: 0-1 drink a day for women. 0-2 drinks a day for men. Know how much alcohol is in your drink. In the [place] one drink equals one 12 oz bottle of beer (355 mL), one 5 oz glass of wine (148 mL), or one 1 oz glass of hard liquor (44 mL). Lifestyle  Work with your health care provider to maintain a healthy body weight or to lose weight. Ask what an ideal weight is for you. Get at least 30 minutes of exercise that causes your heart to beat faster (aerobic exercise) most days of the week. Activities may include walking, swimming, or biking. Include exercise to strengthen your muscles (resistance exercise), such as Pilates or lifting weights, as part of your weekly exercise routine. Try to do these types of exercises for 30 minutes at least 3 days a week. Do not use any products that contain nicotine or tobacco. These products include cigarettes, chewing tobacco, and vaping devices, such as e-cigarettes. If you need help quitting, ask your health care provider. Monitor your blood pressure at home as told by your health care provider. Keep all follow-up visits. This is important. Medicines Take over-the-counter and prescription medicines only as told by your health care provider. Follow directions carefully. Blood  pressure medicines must be taken as prescribed. Do not skip doses of blood pressure medicine. Doing this puts you at risk for problems and can make the medicine less effective. Ask your health care provider about side effects or reactions to medicines that you should watch for. Contact a health care provider if you: Think you are having a reaction to a medicine you are taking. Have headaches that keep coming back (recurring). Feel dizzy. Have swelling in your ankles. Have trouble with your vision. Get help right away if you: Develop a severe headache or confusion. Have unusual weakness or numbness. Feel faint. Have severe pain in your chest or abdomen. Vomit repeatedly. Have trouble breathing. These symptoms may be an emergency. Get help right away. Call 911. Do not wait to see if the symptoms will go away. Do not drive yourself to the hospital. Summary Hypertension is when the force of blood pumping through your arteries is too strong. If this condition is not controlled, it may put you at risk for serious complications. Your personal target blood pressure may vary depending on your medical conditions, your age, and other factors. For most people, a normal blood pressure is less than 120/80. Hypertension is treated with lifestyle changes, medicines, or a combination of both. Lifestyle changes include losing weight, eating a healthy,  low-sodium diet, exercising more, and limiting alcohol. This information is not intended to replace advice given to you by your health care provider. Make sure you discuss any questions you have with your health care provider. Document Revised: 06/27/2021 Document Reviewed: 06/27/2021 Elsevier Patient Education  2024 [employer].

## 2024-05-15 ENCOUNTER — Encounter: Payer: Self-pay | Admitting: Internal Medicine

## 2024-05-17 LAB — SODIUM, URINE, RANDOM: Sodium, Ur: 80 mmol/L (ref 28–272)

## 2024-05-17 LAB — ACTH: C206 ACTH: 14 pg/mL (ref 6–50)

## 2024-05-18 ENCOUNTER — Ambulatory Visit
Admission: RE | Admit: 2024-05-18 | Discharge: 2024-05-18 | Disposition: A | Discharge status: Home / Self Care | Source: Ambulatory Visit | Attending: Internal Medicine | Admitting: Internal Medicine

## 2024-05-18 DIAGNOSIS — R4789 Other speech disturbances: Secondary | ICD-10-CM

## 2024-05-18 DIAGNOSIS — G311 Senile degeneration of brain, not elsewhere classified: Secondary | ICD-10-CM | POA: Diagnosis not present

## 2024-05-20 ENCOUNTER — Encounter: Payer: Self-pay | Admitting: Cardiology

## 2024-05-20 NOTE — Telephone Encounter (Signed)
 It depends on the context, in general yes.   Dr. Jaysa Kise

## 2024-05-21 ENCOUNTER — Encounter: Payer: Self-pay | Admitting: Internal Medicine

## 2024-05-21 ENCOUNTER — Encounter: Payer: Self-pay | Admitting: Cardiology

## 2024-05-21 DIAGNOSIS — K51019 Ulcerative (chronic) pancolitis with unspecified complications: Secondary | ICD-10-CM

## 2024-05-21 MED ORDER — MESALAMINE 1.2 G PO TBEC: 2.4000 g | DELAYED_RELEASE_TABLET | Freq: Every day | ORAL | Status: DC

## 2024-05-21 MED ORDER — MESALAMINE 1.2 G PO TBEC
2.4000 g | DELAYED_RELEASE_TABLET | Freq: Every day | ORAL | 2 refills | Status: DC
Start: 2024-05-21 — End: 2024-06-16

## 2024-05-21 NOTE — Telephone Encounter (Signed)
 Yes, prior studies have noted severe Coronary artery calcification and we have done through workup - including stress test.   The focus is now on secondary prevention such as: lipid, blood pressure, diabetes/sugar management and maintaining a healthy weight.   Yes, those who have coronary disease are at risk of stroke and vice versa. You can speak to your PCP regarding the need of brain scan vs. Seeing neurology based on your concerns.   Hope this helps more than happy to discuss this further in the office if needed.   Kamdin Follett [place], DO, FACC

## 2024-05-22 ENCOUNTER — Telehealth: Payer: Self-pay | Admitting: *Deleted

## 2024-05-22 ENCOUNTER — Encounter: Payer: Self-pay | Admitting: Internal Medicine

## 2024-05-22 NOTE — Progress Notes (Signed)
 Care Guide Pharmacy Note  05/22/2024 Name: [name] MRN: [id number] DOB: 1956-07-25  Referred By: Joshua Debby CROME, MD Reason for referral: Call Attempt #1 and Complex Care Management (Outreach to schedule referral with pharmacist )   [name] is a 68 y.o. year old male who is a primary care patient of Joshua Debby CROME, MD.  [name] was referred to the pharmacist for assistance related to: HTN  An unsuccessful telephone outreach was attempted today to contact the patient who was referred to the pharmacy team for assistance with medication management. Additional attempts will be made to contact the patient.  [name], CMA, Care Guide [facility]  [facility], [facility] Guide Direct Dial: [phone]  Fax: [phone] Website: [email or web address]

## 2024-05-23 ENCOUNTER — Ambulatory Visit: Payer: Self-pay | Admitting: Internal Medicine

## 2024-05-25 NOTE — Progress Notes (Unsigned)
 Care Guide Pharmacy Note  05/25/2024 Name: [name] MRN: [id number] DOB: Aug 30, 1956  Referred By: Joshua Debby CROME, MD Reason for referral: Call Attempt #1 and Complex Care Management (Outreach to schedule referral with pharmacist )   [name] is a 68 y.o. year old male who is a primary care patient of Joshua Debby CROME, MD.  [name] was referred to the pharmacist for assistance related to: HTN  A second unsuccessful telephone outreach was attempted today to contact the patient who was referred to the pharmacy team for assistance with medication management. Additional attempts will be made to contact the patient.  [name], CMA [place]  [facility], [facility] Guide Direct Dial: [phone]  Fax: [phone] Website: [email or web address]

## 2024-05-26 DIAGNOSIS — K080 Exfoliation of teeth due to systemic causes: Secondary | ICD-10-CM | POA: Diagnosis not present

## 2024-05-26 NOTE — Progress Notes (Signed)
 Care Guide Pharmacy Note  05/26/2024 Name: [name] MRN: [id number] DOB: [birth year]  Referred By: Joshua Debby CROME, MD Reason for referral: Call Attempt #1 and Complex Care Management (Outreach to schedule referral with pharmacist )   [name] is a 68 y.o. year old male who is a primary care patient of Joshua Debby CROME, MD.  [name] was referred to the pharmacist for assistance related to: HTN  A third unsuccessful telephone outreach was attempted today to contact the patient who was referred to the pharmacy team for assistance with medication management. The Population Health team is pleased to engage with this patient at any time in the future upon receipt of referral and should he/she be interested in assistance from the [facility] team.  [name], CMA, Care Guide [facility]  [facility], [facility] Guide Direct Dial: [phone]  Fax: [phone] Website: [email or web address]

## 2024-05-27 ENCOUNTER — Encounter: Payer: Self-pay | Admitting: Cardiology

## 2024-05-29 ENCOUNTER — Other Ambulatory Visit: Payer: Self-pay | Admitting: Internal Medicine

## 2024-05-29 DIAGNOSIS — G319 Degenerative disease of nervous system, unspecified: Secondary | ICD-10-CM | POA: Insufficient documentation

## 2024-05-29 DIAGNOSIS — R4789 Other speech disturbances: Secondary | ICD-10-CM

## 2024-06-02 ENCOUNTER — Encounter: Payer: Self-pay | Admitting: Physician Assistant

## 2024-06-05 ENCOUNTER — Encounter: Payer: Self-pay | Admitting: Internal Medicine

## 2024-06-05 ENCOUNTER — Ambulatory Visit (AMBULATORY_SURGERY_CENTER): Admitting: *Deleted

## 2024-06-05 VITALS — Ht 74.0 in | Wt 225.0 lb

## 2024-06-05 DIAGNOSIS — Z860100 Personal history of colon polyps, unspecified: Secondary | ICD-10-CM

## 2024-06-05 DIAGNOSIS — K51919 Ulcerative colitis, unspecified with unspecified complications: Secondary | ICD-10-CM

## 2024-06-05 MED ORDER — NA SULFATE-K SULFATE-MG SULF 17.5-3.13-1.6 GM/177ML PO SOLN: 1.0000 | Freq: Once | ORAL | 0 refills | Status: AC

## 2024-06-05 NOTE — Progress Notes (Signed)
 Pt's name and DOB verified at the beginning of the pre-visit with 2 identifiers  Pt denies any difficulty with ambulating,sitting, laying down or rolling side to side  Pt has no issues moving head neck or swallowing  No egg or soy allergy known to patient   No issues known to pt with past sedation  No FH of Malignant Hyperthermia  Pt is not on home 02   Pt is not on blood thinners   Pt denies issues with constipation   Pt is not on dialysis  Pt denise any abnormal heart rhythms   Pt denies any upcoming cardiac testing  Patient's chart reviewed by Norleen Schillings CNRA prior to pre-visit and patient appropriate for the [facility].  Pre-visit completed and red dot placed by patient's name on their procedure day (on provider's schedule).   Visit in person  Pt states weight is 225 lb  Pt given  both [facility] main # and MD on call # prior to instructions.  Informed pt to come in at the time discussed and is shown on PV instructions.  Pt instructed to use Single[employer].com or GoodRx for a price reduction on prep  Instructed pt where to find PV instructions in My Ch. Copy of instructions  to be sent in mail and address read back to pt to verify correct on envelope. Instructed pt on all aspects of written instructions including med holds clothing to wear and foods to eat and not eat as well as after procedure legal restrictions and to call MD on call if needed.. Pt states understanding. Instructed pt to review instructions again prior to procedure and call main # given if has any questions or any issues. Pt states they will.

## 2024-06-08 DIAGNOSIS — K5190 Ulcerative colitis, unspecified, without complications: Secondary | ICD-10-CM | POA: Diagnosis not present

## 2024-06-10 DIAGNOSIS — F3181 Bipolar II disorder: Secondary | ICD-10-CM | POA: Diagnosis not present

## 2024-06-16 ENCOUNTER — Other Ambulatory Visit: Payer: Self-pay | Admitting: Internal Medicine

## 2024-06-16 DIAGNOSIS — K51019 Ulcerative (chronic) pancolitis with unspecified complications: Secondary | ICD-10-CM

## 2024-06-17 ENCOUNTER — Encounter: Admitting: Internal Medicine

## 2024-06-24 ENCOUNTER — Other Ambulatory Visit: Payer: Self-pay | Admitting: Internal Medicine

## 2024-06-24 DIAGNOSIS — N401 Enlarged prostate with lower urinary tract symptoms: Secondary | ICD-10-CM

## 2024-06-24 NOTE — Telephone Encounter (Unsigned)
 Copied from CRM #[id number]. Topic: Clinical - Medication Refill >> Jun 24, 2024  9:26 AM Rea C wrote: Medication: silodosin  (RAPAFLO ) 8 MG CAPS capsule  Has the patient contacted their pharmacy? Patient stated that [facility] sent request a week ago and have not heard from provider/clinic.  (Agent: If no, request that the patient contact the pharmacy for the refill. If patient does not wish to contact the pharmacy document the reason why and proceed with request.) (Agent: If yes, when and what did the pharmacy advise?)  This is the patient's preferred pharmacy:   [facility] - [place], [place] - [address] [place] [address] Phone: [phone] Fax: [phone]   Is this the correct pharmacy for this prescription? Yes  If no, delete pharmacy and type the correct one.   Has the prescription been filled recently? No  Is the patient out of the medication? Yes  Has the patient been seen for an appointment in the last year OR does the patient have an upcoming appointment? Yes  Can we respond through MyChart? Yes  Agent: Please be advised that Rx refills may take up to 3 business days. We ask that you follow-up with your pharmacy.

## 2024-06-25 ENCOUNTER — Other Ambulatory Visit: Payer: Self-pay

## 2024-06-25 ENCOUNTER — Encounter: Payer: Self-pay | Admitting: Internal Medicine

## 2024-06-25 DIAGNOSIS — N401 Enlarged prostate with lower urinary tract symptoms: Secondary | ICD-10-CM

## 2024-06-25 MED ORDER — SILODOSIN 8 MG PO CAPS: 8.0000 mg | ORAL_CAPSULE | Freq: Every day | ORAL | 1 refills | Status: DC

## 2024-06-30 ENCOUNTER — Other Ambulatory Visit: Payer: Self-pay | Admitting: Internal Medicine

## 2024-06-30 ENCOUNTER — Telehealth: Payer: Self-pay | Admitting: Internal Medicine

## 2024-06-30 DIAGNOSIS — F1020 Alcohol dependence, uncomplicated: Secondary | ICD-10-CM | POA: Diagnosis not present

## 2024-06-30 DIAGNOSIS — F319 Bipolar disorder, unspecified: Secondary | ICD-10-CM | POA: Diagnosis not present

## 2024-06-30 DIAGNOSIS — Z125 Encounter for screening for malignant neoplasm of prostate: Secondary | ICD-10-CM | POA: Diagnosis not present

## 2024-06-30 DIAGNOSIS — N401 Enlarged prostate with lower urinary tract symptoms: Secondary | ICD-10-CM

## 2024-06-30 MED ORDER — SILODOSIN 8 MG PO CAPS: 8.0000 mg | ORAL_CAPSULE | Freq: Every day | ORAL | 1 refills | Status: DC

## 2024-06-30 NOTE — Telephone Encounter (Unsigned)
 Copied from CRM #[id number]. Topic: Clinical - Medication Refill >> Jun 30, 2024  9:38 AM Alfonso HERO wrote: Medication: silodosin  (RAPAFLO ) 8 MG CAPS capsule   Has the patient contacted their pharmacy? Yes (Agent: If no, request that the patient contact the pharmacy for the refill. If patient does not wish to contact the pharmacy document the reason why and proceed with request.) (Agent: If yes, when and what did the pharmacy advise?)  This is the patient's preferred pharmacy:    [id number] - [place], [place] - [address] AT [address] OF [address]  [place] [id number] Phone: [phone] Fax: [phone]  Is this the correct pharmacy for this prescription? Yes If no, delete pharmacy and type the correct one.   Has the prescription been filled recently? Yes  Is the patient out of the medication? Yes  Has the patient been seen for an appointment in the last year OR does the patient have an upcoming appointment? Yes  Can we respond through MyChart? Yes  Agent: Please be advised that Rx refills may take up to 3 business days. We ask that you follow-up with your pharmacy.

## 2024-07-01 ENCOUNTER — Encounter: Payer: Self-pay | Admitting: Internal Medicine

## 2024-07-01 ENCOUNTER — Other Ambulatory Visit: Payer: Self-pay

## 2024-07-01 DIAGNOSIS — N401 Enlarged prostate with lower urinary tract symptoms: Secondary | ICD-10-CM

## 2024-07-01 MED ORDER — DUTASTERIDE 0.5 MG PO CAPS: 0.5000 mg | ORAL_CAPSULE | Freq: Every day | ORAL | 3 refills | Status: DC

## 2024-07-05 ENCOUNTER — Encounter (HOSPITAL_COMMUNITY): Payer: Self-pay

## 2024-07-05 ENCOUNTER — Ambulatory Visit (HOSPITAL_COMMUNITY): Admission: EM | Admit: 2024-07-05 | Discharge: 2024-07-05 | Disposition: A | Discharge status: Home / Self Care

## 2024-07-05 DIAGNOSIS — H6123 Impacted cerumen, bilateral: Secondary | ICD-10-CM

## 2024-07-05 DIAGNOSIS — Z792 Long term (current) use of antibiotics: Secondary | ICD-10-CM | POA: Diagnosis not present

## 2024-07-05 DIAGNOSIS — J40 Bronchitis, not specified as acute or chronic: Secondary | ICD-10-CM | POA: Diagnosis not present

## 2024-07-05 MED ORDER — PROMETHAZINE-DM 6.25-15 MG/5ML PO SYRP: 5.0000 mL | ORAL_SOLUTION | Freq: Every evening | ORAL | 0 refills | Status: DC | PRN

## 2024-07-05 MED ORDER — AZITHROMYCIN 250 MG PO TABS: ORAL_TABLET | ORAL | 0 refills | Status: AC

## 2024-07-05 MED ORDER — ALBUTEROL SULFATE HFA 108 (90 BASE) MCG/ACT IN AERS: 2.0000 | INHALATION_SPRAY | Freq: Four times a day (QID) | RESPIRATORY_TRACT | 2 refills | Status: AC | PRN

## 2024-07-05 MED ORDER — GUAIFENESIN 400 MG PO TABS
ORAL_TABLET | ORAL | 0 refills | Status: AC
Start: 2024-07-05 — End: ?

## 2024-07-05 NOTE — ED Provider Notes (Addendum)
 [facility]    CSN: [id number] Arrival date & time: 07/05/24  9175    HISTORY   Chief Complaint  Patient presents with   Cough   HPI [name] is a pleasant, 68 y.o. male who presents to urgent care today. Patient complains of assistant cough intermittently productive of sputum but states it is hard to cough up, nasal congestion congestion, clear rhinorrhea for the past 4 to 5 days.  Patient states he has had this before a few years ago, did not require medical intervention at the time.  Patient denies known sick contacts, states he lives alone with the exception of his 2 dogs.  Patient states he does not have a history of allergies or asthma, has never required use of an inhaler.  States he has been taking OTC Robitussin (does not recall version of Robitussin) without meaningful relief, states his ribs are now sore from coughing.  Denies shortness of breath, chest pain, headache, fever, body aches, chills, sore throat, otalgia, nausea, vomiting, diarrhea.    The history is provided by the patient.  Cough  Past Medical History:  Diagnosis Date   Alcoholism (HCC)    10 yrs mostly sober in AA    Anemia    Bipolar 2 disorder (HCC)    on meds   Cancer (HCC)    skin, basal cell   Depression    on meds   Hemorrhoids 11/23/2010   Colonoscopy with [facility]   Hyperlipemia    on meds   Hypertension    on meds   Muscle spasm of back    Personal history of colonic polyps 11/23/2010   5 mm adenoma Colonoscopy with [facility]   Rosacea    Substance abuse (HCC)    recovering alcoholic- in remission   Ulcerative colitis 11/23/2010   Colonoscopy with [facility]   Patient Active Problem List   Diagnosis Date Noted   Cerebral atrophy, mild 05/29/2024   Need for influenza vaccination 05/14/2024   Word finding difficulty 05/14/2024   Adrenal insufficiency 01/15/2024   Alcohol dependence (HCC) 02/07/2023   Alcohol dependence, in remission (HCC) 02/07/2023   Bipolar  disorder, unspecified (HCC) 02/07/2023   Flu vaccine need 05/26/2022   Encounter for general adult medical examination with abnormal findings 05/26/2022   Chronic hyponatremia 02/22/2021   Abnormal electrocardiogram (ECG) (EKG) 02/21/2021   Unifocal PVCs 02/03/2020   Benign prostatic hyperplasia with urinary hesitancy 10/15/2019   Prediabetes 01/07/2019   Essential hypertension 01/07/2019   Onychomycosis of toenail 05/10/2016   Chronic post-traumatic stress disorder (PTSD) 11/09/2015   Alcohol use disorder, severe, dependence (HCC) 11/09/2015   Dyslipidemia, goal LDL below 100 06/19/2011   Universal ulcerative colitis (HCC) 04/26/2011   Bipolar 2 disorder (HCC) 04/26/2011   Hx of adenomatous colonic polyps 11/23/2010   Past Surgical History:  Procedure Laterality Date   ACHILLES TENDON REPAIR Right 1995   BASAL CELL CARCINOMA EXCISION     COLONOSCOPY  2019   CG-MAC-prep good ([facility])-benign colonic mucosas/recall 3 yr   COLONOSCOPY W/ BIOPSIES  2003, 04/2008, 11/2010   2003: proctitis 2009: ulcerative colitis and hemorrhoids 2012: ulcerative colitis and 5mm sigmoid  tubular adenoma   KNEE SURGERY Left 2023   MOHS SURGERY  2006   POLYPECTOMY     QUADRICEPS TENDON REPAIR Right 2021   SHOULDER SURGERY Right 2001   TONSILLECTOMY      Home Medications    Prior to Admission medications   Medication Sig Start Date  End Date Taking? Authorizing Provider  amoxicillin  (AMOXIL ) 500 MG capsule Take 1 capsule (500 mg total) by mouth daily. 10/09/23  Yes Tolia, Sunit, DO  aspirin  EC 81 MG tablet Take 1 tablet (81 mg total) by mouth daily. Swallow whole. 07/04/21  Yes Tolia, Sunit, DO  atorvastatin  (LIPITOR) 80 MG tablet Take 1 tablet (80 mg total) by mouth daily. 05/14/24  Yes Joshua Debby CROME, MD  DULoxetine (CYMBALTA) 60 MG capsule Take 60 mg by mouth daily.   Yes [provider]  dutasteride  (AVODART ) 0.5 MG capsule Take 1 capsule (0.5 mg total) by mouth daily. 07/01/24  Yes Joshua Debby CROME, MD  Fish Oil-Cholecalciferol (FISH OIL + D3 PO) Take 1 capsule by mouth daily.   Yes [provider]  fluorouracil (EFUDEX) 5 % cream Apply topically as needed.   Yes [provider]  hydrocortisone  (CORTEF ) 5 MG tablet Take 1 tablet (5 mg total) by mouth 2 (two) times daily. 05/14/24  Yes Joshua Debby CROME, MD  hydrocortisone  2.5 % cream Apply 1 Application topically 2 (two) times daily. 07/23/22  Yes [provider]  lamoTRIgine (LAMICTAL) 150 MG tablet Take 150 mg by mouth 2 (two) times daily. 02/11/15  Yes [provider]  mesalamine  (LIALDA ) 1.2 g EC tablet TAKE 2 TABLETS BY MOUTH DAILY. 06/16/24  Yes Avram Lupita BRAVO, MD  Multiple Vitamin (MULTIVITAMIN) tablet Take 1 tablet by mouth daily.   Yes [provider]  naltrexone  (DEPADE) 50 MG tablet Take 50 mg by mouth daily. 05/20/24  Yes [provider]  olmesartan  (BENICAR ) 20 MG tablet Take 1 tablet (20 mg total) by mouth daily. 05/14/24  Yes Joshua Debby CROME, MD  QUEtiapine (SEROQUEL) 200 MG tablet Take 200 mg by mouth at bedtime. 12/20/21  Yes [provider]  silodosin  (RAPAFLO ) 8 MG CAPS capsule Take 1 capsule (8 mg total) by mouth daily. 06/30/24  Yes Joshua Debby CROME, MD    Family History Family History  Adopted: Yes  Problem Relation Age of Onset   Colon polyps Neg Hx    Colon cancer Neg Hx    Esophageal cancer Neg Hx    Stomach cancer Neg Hx    Rectal cancer Neg Hx    Social History Social History   Tobacco Use   Smoking status: Never   Smokeless tobacco: Never  Vaping Use   Vaping status: Never Used  Substance Use Topics   Alcohol use: Not Currently    Comment: recovering alcoholic-10/2020   Drug use: Never   Allergies   Patient has no known allergies.  Review of Systems Review of Systems  Respiratory:  Positive for cough.    Pertinent findings revealed after performing a 14 point review of systems has been noted in the history of present  illness.  Physical Exam Vital Signs BP (!) 152/89 (BP Location: Right Arm)   Pulse 67   Temp 98 F (36.7 C) (Oral)   Resp 18   Ht 6' 2 (1.88 m)   Wt 225 lb (102.1 kg)   SpO2 95%   BMI 28.89 kg/m   No data found.  Physical Exam Vitals and nursing note reviewed.  Constitutional:      General: He is awake. He is not in acute distress.    Appearance: Normal appearance. He is well-developed and well-groomed. He is not ill-appearing.  HENT:     Head: Normocephalic and atraumatic.     Salivary Glands: Right salivary gland is not diffusely enlarged or  tender. Left salivary gland is not diffusely enlarged or tender.     Right Ear: Hearing, ear canal and external ear normal. There is impacted cerumen.     Left Ear: Hearing, ear canal and external ear normal. There is impacted cerumen.     Nose: Nose normal. No congestion or rhinorrhea.     Right Turbinates: Not enlarged, swollen or pale.     Left Turbinates: Not enlarged, swollen or pale.     Right Sinus: No maxillary sinus tenderness or frontal sinus tenderness.     Left Sinus: No maxillary sinus tenderness or frontal sinus tenderness.     Mouth/Throat:     Lips: Pink. No lesions.     Mouth: Mucous membranes are moist. No oral lesions.     Tongue: No lesions. Tongue does not deviate from midline.     Palate: No mass and lesions.     Pharynx: Oropharynx is clear. Uvula midline. Postnasal drip present. No pharyngeal swelling, oropharyngeal exudate, posterior oropharyngeal erythema or uvula swelling.     Tonsils: No tonsillar exudate. 0 on the right. 0 on the left.  Eyes:     General: Lids are normal.        Right eye: No discharge.        Left eye: No discharge.     Conjunctiva/sclera: Conjunctivae normal.     Right eye: Right conjunctiva is not injected.     Left eye: Left conjunctiva is not injected.  Neck:     Trachea: Trachea and phonation normal.  Cardiovascular:     Rate and Rhythm: Normal rate and regular rhythm.      Heart sounds: Normal heart sounds.  Pulmonary:     Effort: Pulmonary effort is normal. No tachypnea, bradypnea or prolonged expiration.     Breath sounds: Examination of the right-upper field reveals rhonchi. Examination of the right-middle field reveals rhonchi. Examination of the left-middle field reveals rhonchi. Rhonchi present. No decreased breath sounds, wheezing or rales.     Comments: Persistent cough productive of sputum appreciated throughout auscultation, patient unable to take a deep breath without coughing. Chest:     Chest wall: No tenderness.  Musculoskeletal:        General: Normal range of motion.     Cervical back: Full passive range of motion without pain, normal range of motion and neck supple. Normal range of motion.  Lymphadenopathy:     Cervical: No cervical adenopathy.  Skin:    General: Skin is warm and dry.     Findings: No erythema or rash.  Neurological:     General: No focal deficit present.     Mental Status: He is alert and oriented to person, place, and time. Mental status is at baseline.  Psychiatric:        Attention and Perception: Attention and perception normal.        Mood and Affect: Mood and affect normal.        Speech: Speech normal.        Behavior: Behavior normal. Behavior is cooperative.        Thought Content: Thought content normal.     Visual Acuity Right Eye Distance:   Left Eye Distance:   Bilateral Distance:    Right Eye Near:   Left Eye Near:    Bilateral Near:     UC Couse / Diagnostics / Procedures:     Radiology No results found.  Procedures Ear Cerumen Removal  Date/Time: 07/05/2024 9:12 AM  Performed by: Joesph Shaver Scales, PA-C Authorized by: Joesph Shaver Scales, PA-C   Consent:    Consent obtained:  Verbal   Consent given by:  Patient   Risks, benefits, and alternatives were discussed: yes     Risks discussed:  Dizziness, TM perforation, pain, incomplete removal, infection and bleeding    Alternatives discussed:  No treatment, delayed treatment, alternative treatment, observation and referral Universal protocol:    Procedure explained and questions answered to patient or proxy's satisfaction: yes     Patient identity confirmed:  Verbally with patient and arm band Procedure details:    Location:  L ear and R ear   Procedure type: irrigation     Procedure outcomes: cerumen removed   Post-procedure details:    Inspection:  No bleeding, TM intact and some cerumen remaining   Hearing quality:  Normal   Procedure completion:  Tolerated  (including critical care time) EKG  Pending results:  Labs Reviewed - No data to display  Medications Ordered in UC: Medications - No data to display  UC Diagnoses / Final Clinical Impressions(s)   I have reviewed the triage vital signs and the nursing notes.  Pertinent labs & imaging results that were available during my care of the patient were reviewed by me and considered in my medical decision making (see chart for details).    Final diagnoses:  Bronchitis  Chronic antibiotic suppression  Bilateral impacted cerumen  Unable to completely remove cerumen from either ear, Debrox recommended and follow-up with primary care. Physical exam findings concerning for bronchitis, do not believe x-ray is indicated at this time.  Patient provided with azithromycin for broad-spectrum coverage of atypical bacteria given chronic suppression with amoxicillin  for skin condition both for pneumonia prevention and to calm respiratory inflammation.  Patient provided with an albuterol  inhaler to use when cough becomes persistent.  Continue use of single ingredient guaifenesin in the daytime to help promote expectoration and Promethazine DM provided for nighttime cough.  Conservative care recommended.  Return cautions advised.  Please see discharge instructions below for details of plan of care as provided to patient. ED Prescriptions     Medication Sig  Dispense Auth. Provider   azithromycin (ZITHROMAX) 250 MG tablet Take 2 tablets (500 mg total) by mouth daily for 1 day, THEN 1 tablet (250 mg total) daily for 4 days. 6 tablet Joesph Shaver Scales, PA-C   guaifenesin (HUMIBID E) 400 MG TABS tablet Take 1 tablet 3 times daily as needed for chest congestion and cough 30 tablet Joesph Shaver Scales, PA-C   promethazine-dextromethorphan (PROMETHAZINE-DM) 6.25-15 MG/5ML syrup Take 5 mLs by mouth at bedtime as needed for cough. 60 mL Joesph Shaver Scales, PA-C   albuterol  (VENTOLIN  HFA) 108 (90 Base) MCG/ACT inhaler Inhale 2 puffs into the lungs every 6 (six) hours as needed for wheezing or shortness of breath (Cough). 18 g Joesph Shaver Scales, PA-C      PDMP not reviewed this encounter.  Pending results:  Labs Reviewed - No data to display    Discharge Instructions      I have enclosed information about earwax buildup that I hope you find helpful.  After using Debrox for several days, please follow-up with your primary care provider or return here to urgent care to have your ears flushed out.  At this time, I believe that your cough is due to viral bronchitis but because you take a low-dose of amoxicillin  every day, a broader spectrum antibiotic is recommended to prevent the  viral infection from progressing to a bacterial bronchitis or pneumonia.    Please read below to learn more about the medications, dosages and frequencies that I recommend to help alleviate your symptoms and to get you feeling better soon:   Zithromax (azithromycin):  Please take two (2) tablets on day one and one tablet daily thereafter until the prescription is complete.This antibiotic can cause upset stomach, this will resolve once antibiotics are complete.  You are welcome to take a probiotic, eat yogurt, take Imodium while taking this medication.  Please avoid other systemic medications such as Maalox, Pepto-Bismol or milk of magnesia as they can interfere with  the body's ability to absorb the antibiotics.       ProAir , Ventolin , Proventil  (albuterol ): This inhaled medication contains a short acting beta agonist bronchodilator.  This medication relaxes the smooth muscle of the airway in the lungs.  When these muscles are tight, breathing becomes more constricted.  The result of relaxation of the smooth muscle is increased air movement and improved work of breathing.  This is a short acting medication that can be used every 4-6 hours as needed for increased work of breathing, shortness of breath, wheezing and excessive coughing.  It comes in the form of a handheld inhaler or nebulizer solution.  I recommended that for the next 3 to 4 days, this medication is used 4 times daily on a scheduled basis then decrease to twice daily and as needed until symptoms have completely resolved which I anticipate will be several weeks.   Robitussin, Mucinex (guaifenesin): This is a daytime expectorant.  This single symptom reliever helps break up chest congestion and loosen up thick nasal drainage making phlegm and drainage easier to cough up and to blow out from your nose.  I recommend taking 400 mg in either liquid or tablet form three times daily as needed.  I do not recommend the 12-hour extended relief version or doses higher than 400 mg per each dose as these often make some patients feel jittery or jumpy and can interfere with sleep.  I also do not recommend that you purchase guaifenesin with the ingredient  DM which is dextromethorphan, a cough suppressant which I only recommend taking at bedtime.  Guaifenesin 400 mg is a safe dose for people who are being treated for high blood pressure.     Promethazine DM: Promethazine is both a nasal decongestant that dries up mucous membranes and an antinausea medication.  Promethazine often makes most patients feel fairly sleepy.  DM is dextromethorphan, a single symptom reliever which is a cough suppressant found in many  over-the-counter cough medications and combination cold preparations.  Please take 5 mL before bedtime to minimize your cough which will help you sleep better.  I have sent a prescription for this medication to your pharmacy because it cannot be purchased over-the-counter.   If symptoms have not meaningfully improved in the next 5 to 7 days, please return for repeat evaluation or follow-up with your regular provider.  If symptoms have worsened in the next 3 to 5 days, please go to the emergency room for further evaluation.    Thank you for visiting urgent care today.  We appreciate the opportunity to participate in your care.       Disposition Upon Discharge:  Condition: stable for discharge home  Patient presented with an acute illness with associated systemic symptoms and significant discomfort requiring urgent management. In my opinion, this is a condition that a prudent lay person (  someone who possesses an average knowledge of health and medicine) may potentially expect to result in complications if not addressed urgently such as respiratory distress, impairment of bodily function or dysfunction of bodily organs.   Routine symptom specific, illness specific and/or disease specific instructions were discussed with the patient and/or caregiver at length.   As such, the patient has been evaluated and assessed, work-up was performed and treatment was provided in alignment with urgent care protocols and evidence based medicine.  Patient/parent/caregiver has been advised that the patient may require follow up for further testing and treatment if the symptoms continue in spite of treatment, as clinically indicated and appropriate.  Patient/parent/caregiver has been advised to return to the [facility] or PCP if no better; to PCP or the Emergency Department if new signs and symptoms develop, or if the current signs or symptoms continue to change or worsen for further workup, evaluation and treatment as  clinically indicated and appropriate  The patient will follow up with their current PCP if and as advised. If the patient does not currently have a PCP we will assist them in obtaining one.   The patient may need specialty follow up if the symptoms continue, in spite of conservative treatment and management, for further workup, evaluation, consultation and treatment as clinically indicated and appropriate.  Patient/parent/caregiver verbalized understanding and agreement of plan as discussed.  All questions were addressed during visit.  Please see discharge instructions below for further details of plan.  This office note has been dictated using Teaching laboratory technician.  Unfortunately, this method of dictation can sometimes lead to typographical or grammatical errors.  I apologize for your inconvenience in advance if this occurs.  Please do not hesitate to reach out to me if clarification is needed.      Joesph Shaver Scales, PA-C 07/05/24 0928    Joesph Shaver Scales, PA-C 07/05/24 [phone]

## 2024-07-05 NOTE — Discharge Instructions (Addendum)
 I have enclosed information about earwax buildup that I hope you find helpful.  After using Debrox for several days, please follow-up with your primary care provider or return here to urgent care to have your ears flushed out.  At this time, I believe that your cough is due to viral bronchitis but because you take a low-dose of amoxicillin  every day, a broader spectrum antibiotic is recommended to prevent the viral infection from progressing to a bacterial bronchitis or pneumonia.    Please read below to learn more about the medications, dosages and frequencies that I recommend to help alleviate your symptoms and to get you feeling better soon:   Zithromax (azithromycin):  Please take two (2) tablets on day one and one tablet daily thereafter until the prescription is complete.This antibiotic can cause upset stomach, this will resolve once antibiotics are complete.  You are welcome to take a probiotic, eat yogurt, take Imodium while taking this medication.  Please avoid other systemic medications such as Maalox, Pepto-Bismol or milk of magnesia as they can interfere with the body's ability to absorb the antibiotics.       ProAir , Ventolin , Proventil  (albuterol ): This inhaled medication contains a short acting beta agonist bronchodilator.  This medication relaxes the smooth muscle of the airway in the lungs.  When these muscles are tight, breathing becomes more constricted.  The result of relaxation of the smooth muscle is increased air movement and improved work of breathing.  This is a short acting medication that can be used every 4-6 hours as needed for increased work of breathing, shortness of breath, wheezing and excessive coughing.  It comes in the form of a handheld inhaler or nebulizer solution.  I recommended that for the next 3 to 4 days, this medication is used 4 times daily on a scheduled basis then decrease to twice daily and as needed until symptoms have completely resolved which I anticipate  will be several weeks.   Robitussin, Mucinex (guaifenesin): This is a daytime expectorant.  This single symptom reliever helps break up chest congestion and loosen up thick nasal drainage making phlegm and drainage easier to cough up and to blow out from your nose.  I recommend taking 400 mg in either liquid or tablet form three times daily as needed.  I do not recommend the 12-hour extended relief version or doses higher than 400 mg per each dose as these often make some patients feel jittery or jumpy and can interfere with sleep.  I also do not recommend that you purchase guaifenesin with the ingredient  DM which is dextromethorphan, a cough suppressant which I only recommend taking at bedtime.  Guaifenesin 400 mg is a safe dose for people who are being treated for high blood pressure.     Promethazine DM: Promethazine is both a nasal decongestant that dries up mucous membranes and an antinausea medication.  Promethazine often makes most patients feel fairly sleepy.  DM is dextromethorphan, a single symptom reliever which is a cough suppressant found in many over-the-counter cough medications and combination cold preparations.  Please take 5 mL before bedtime to minimize your cough which will help you sleep better.  I have sent a prescription for this medication to your pharmacy because it cannot be purchased over-the-counter.   If symptoms have not meaningfully improved in the next 5 to 7 days, please return for repeat evaluation or follow-up with your regular provider.  If symptoms have worsened in the next 3 to 5 days, please go  to the emergency room for further evaluation.    Thank you for visiting urgent care today.  We appreciate the opportunity to participate in your care.

## 2024-07-05 NOTE — ED Triage Notes (Signed)
 Dry cough, congestion, runny nose onset 4-5 days ago. No one with similar symptoms. Denies any history of breathing or lung issues. Having sore ribs from coughing.   Patient tried robitussin with little relief.

## 2024-07-06 ENCOUNTER — Ambulatory Visit (HOSPITAL_COMMUNITY): Payer: Self-pay

## 2024-07-09 ENCOUNTER — Encounter: Payer: Self-pay | Admitting: Internal Medicine

## 2024-07-11 ENCOUNTER — Ambulatory Visit
Admission: RE | Admit: 2024-07-11 | Discharge: 2024-07-11 | Disposition: A | Discharge status: Home / Self Care | Source: Ambulatory Visit | Attending: Family Medicine

## 2024-07-11 ENCOUNTER — Ambulatory Visit: Payer: Self-pay

## 2024-07-11 ENCOUNTER — Ambulatory Visit: Payer: Self-pay | Admitting: Urgent Care

## 2024-07-11 ENCOUNTER — Ambulatory Visit (INDEPENDENT_AMBULATORY_CARE_PROVIDER_SITE_OTHER)

## 2024-07-11 DIAGNOSIS — R053 Chronic cough: Secondary | ICD-10-CM | POA: Diagnosis not present

## 2024-07-11 DIAGNOSIS — R69 Illness, unspecified: Secondary | ICD-10-CM | POA: Diagnosis not present

## 2024-07-11 DIAGNOSIS — J209 Acute bronchitis, unspecified: Secondary | ICD-10-CM | POA: Diagnosis not present

## 2024-07-11 DIAGNOSIS — R918 Other nonspecific abnormal finding of lung field: Secondary | ICD-10-CM | POA: Diagnosis not present

## 2024-07-11 MED ORDER — PREDNISONE 20 MG PO TABS: ORAL_TABLET | ORAL | 0 refills | Status: DC

## 2024-07-11 NOTE — ED Triage Notes (Signed)
 Pt c/o prod/dry cough x 10 days-states he was seen last week at [facility] and feels no better-denies fever-NAD-steady gait

## 2024-07-11 NOTE — Discharge Instructions (Addendum)
 Will update you with your chest x-ray results later today. For now, start an oral prednisone course to help you clear your bronchitis.

## 2024-07-11 NOTE — ED Provider Notes (Signed)
 [name]  Note:  This document was prepared using Conservation officer, historic buildings and may include unintentional dictation errors.  MRN: [id number] DOB: October 23, 1955  Subjective:   [name] is a 68 y.o. male presenting for 10 day history of persistent hacking productive cough worse with trying to take a deep breath. Has chest tightness. No smoking of any kind including cigarettes, cigars, vaping, marijuana use.  Was seen 07/05/2024 and was started on azithromycin to help with bronchitis. Takes amoxicillin  chronically.  NO asthma. Has previously done well with steroids.   No current facility-administered medications for this encounter.  Current Outpatient Medications:    albuterol  (VENTOLIN  HFA) 108 (90 Base) MCG/ACT inhaler, Inhale 2 puffs into the lungs every 6 (six) hours as needed for wheezing or shortness of breath (Cough)., Disp: 18 g, Rfl: 2   amoxicillin  (AMOXIL ) 500 MG capsule, Take 500 mg by mouth daily., Disp: , Rfl:    aspirin  EC 81 MG tablet, Take 1 tablet (81 mg total) by mouth daily. Swallow whole., Disp: 30 tablet, Rfl: 11   atorvastatin  (LIPITOR) 80 MG tablet, Take 1 tablet (80 mg total) by mouth daily., Disp: 90 tablet, Rfl: 0   DULoxetine (CYMBALTA) 60 MG capsule, Take 60 mg by mouth daily., Disp: , Rfl:    dutasteride  (AVODART ) 0.5 MG capsule, Take 1 capsule (0.5 mg total) by mouth daily., Disp: 90 capsule, Rfl: 3   Fish Oil-Cholecalciferol (FISH OIL + D3 PO), Take 1 capsule by mouth daily., Disp: , Rfl:    fluorouracil (EFUDEX) 5 % cream, Apply topically as needed., Disp: , Rfl:    guaifenesin (HUMIBID E) 400 MG TABS tablet, Take 1 tablet 3 times daily as needed for chest congestion and cough, Disp: 30 tablet, Rfl: 0   hydrocortisone  (CORTEF ) 5 MG tablet, Take 1 tablet (5 mg total) by mouth 2 (two) times daily., Disp: 180 tablet, Rfl: 0   lamoTRIgine (LAMICTAL) 150 MG tablet, Take 150 mg by mouth 2 (two) times daily., Disp: , Rfl: 1    mesalamine  (LIALDA ) 1.2 g EC tablet, TAKE 2 TABLETS BY MOUTH DAILY., Disp: 180 tablet, Rfl: 1   Multiple Vitamin (MULTIVITAMIN) tablet, Take 1 tablet by mouth daily., Disp: , Rfl:    naltrexone  (DEPADE) 50 MG tablet, Take 50 mg by mouth daily., Disp: , Rfl:    olmesartan  (BENICAR ) 20 MG tablet, Take 1 tablet (20 mg total) by mouth daily., Disp: 90 tablet, Rfl: 0   promethazine-dextromethorphan (PROMETHAZINE-DM) 6.25-15 MG/5ML syrup, Take 5 mLs by mouth at bedtime as needed for cough., Disp: 60 mL, Rfl: 0   QUEtiapine (SEROQUEL) 200 MG tablet, Take 200 mg by mouth at bedtime., Disp: , Rfl:    silodosin  (RAPAFLO ) 8 MG CAPS capsule, Take 1 capsule (8 mg total) by mouth daily., Disp: 90 capsule, Rfl: 1   No Known Allergies  Past Medical History:  Diagnosis Date   Alcoholism (HCC)    10 yrs mostly sober in AA    Anemia    Bipolar 2 disorder (HCC)    on meds   Cancer (HCC)    skin, basal cell   Depression    on meds   Hemorrhoids 11/23/2010   Colonoscopy with [facility]   Hyperlipemia    on meds   Hypertension    on meds   Muscle spasm of back    Personal history of colonic polyps 11/23/2010   5 mm adenoma Colonoscopy with [facility]   Rosacea  Substance abuse (HCC)    recovering alcoholic- in remission   Ulcerative colitis 11/23/2010   Colonoscopy with [facility]     Past Surgical History:  Procedure Laterality Date   ACHILLES TENDON REPAIR Right 1995   BASAL CELL CARCINOMA EXCISION     COLONOSCOPY  2019   CG-MAC-prep good ([facility])-benign colonic mucosas/recall 3 yr   COLONOSCOPY W/ BIOPSIES  2003, 04/2008, 11/2010   2003: proctitis 2009: ulcerative colitis and hemorrhoids 2012: ulcerative colitis and 5mm sigmoid  tubular adenoma   KNEE SURGERY Left 2023   MOHS SURGERY  2006   POLYPECTOMY     QUADRICEPS TENDON REPAIR Right 2021   SHOULDER SURGERY Right 2001   TONSILLECTOMY      Family History  Adopted: Yes  Problem Relation Age of Onset   Colon polyps Neg Hx    Colon  cancer Neg Hx    Esophageal cancer Neg Hx    Stomach cancer Neg Hx    Rectal cancer Neg Hx     Social History   Tobacco Use   Smoking status: Never   Smokeless tobacco: Never  Vaping Use   Vaping status: Never Used  Substance Use Topics   Alcohol use: Not Currently    Comment: recovering alcoholic-10/2020   Drug use: Never    ROS   Objective:   Vitals: BP (!) (P) 163/91 (BP Location: Right Arm)   Pulse (P) 68   Temp (P) 98 F (36.7 C) (Oral)   Resp (P) 20   SpO2 (P) 94%   Physical Exam Constitutional:      General: He is not in acute distress.    Appearance: Normal appearance. He is well-developed. He is not ill-appearing, toxic-appearing or diaphoretic.  HENT:     Head: Normocephalic and atraumatic.     Right Ear: External ear normal.     Left Ear: External ear normal.     Nose: Nose normal.     Mouth/Throat:     Mouth: Mucous membranes are moist.  Eyes:     General: No scleral icterus.       Right eye: No discharge.        Left eye: No discharge.     Extraocular Movements: Extraocular movements intact.  Cardiovascular:     Rate and Rhythm: Normal rate and regular rhythm.     Heart sounds: Normal heart sounds. No murmur heard.    No friction rub. No gallop.  Pulmonary:     Effort: No respiratory distress.     Breath sounds: No stridor. No wheezing, rhonchi or rales.     Comments: Difficulty taking deep breaths as it elicits coughing spell.  Neurological:     Mental Status: He is alert and oriented to person, place, and time.  Psychiatric:        Mood and Affect: Mood normal.        Behavior: Behavior normal.        Thought Content: Thought content normal.     Assessment and Plan :   PDMP not reviewed this encounter.  1. Acute bronchitis, unspecified organism   2. Persistent cough     Recommend an oral prednisone course for management of his bronchitis. Radiology over-read is pending. Counseled patient on potential for adverse effects with  medications prescribed/recommended today, ER and return-to-clinic precautions discussed, patient verbalized understanding.    [name], [place] 07/11/24 1129

## 2024-07-15 DIAGNOSIS — R350 Frequency of micturition: Secondary | ICD-10-CM | POA: Diagnosis not present

## 2024-07-15 DIAGNOSIS — R3914 Feeling of incomplete bladder emptying: Secondary | ICD-10-CM | POA: Diagnosis not present

## 2024-07-15 DIAGNOSIS — R351 Nocturia: Secondary | ICD-10-CM | POA: Diagnosis not present

## 2024-07-15 DIAGNOSIS — N401 Enlarged prostate with lower urinary tract symptoms: Secondary | ICD-10-CM | POA: Diagnosis not present

## 2024-07-16 ENCOUNTER — Encounter: Payer: Self-pay | Admitting: Internal Medicine

## 2024-07-22 ENCOUNTER — Other Ambulatory Visit: Payer: Self-pay

## 2024-07-22 DIAGNOSIS — E2740 Unspecified adrenocortical insufficiency: Secondary | ICD-10-CM

## 2024-07-22 DIAGNOSIS — F3181 Bipolar II disorder: Secondary | ICD-10-CM | POA: Diagnosis not present

## 2024-07-22 MED ORDER — HYDROCORTISONE 5 MG PO TABS: 5.0000 mg | ORAL_TABLET | Freq: Two times a day (BID) | ORAL | 0 refills | Status: AC

## 2024-07-24 ENCOUNTER — Other Ambulatory Visit: Payer: Self-pay

## 2024-07-24 DIAGNOSIS — E785 Hyperlipidemia, unspecified: Secondary | ICD-10-CM

## 2024-07-24 DIAGNOSIS — I10 Essential (primary) hypertension: Secondary | ICD-10-CM

## 2024-07-24 MED ORDER — ATORVASTATIN CALCIUM 80 MG PO TABS: 80.0000 mg | ORAL_TABLET | Freq: Every day | ORAL | 1 refills | Status: AC

## 2024-07-24 MED ORDER — OLMESARTAN MEDOXOMIL 20 MG PO TABS: 20.0000 mg | ORAL_TABLET | Freq: Every day | ORAL | 1 refills | Status: AC

## 2024-07-29 DIAGNOSIS — L738 Other specified follicular disorders: Secondary | ICD-10-CM | POA: Diagnosis not present

## 2024-07-29 DIAGNOSIS — C4441 Basal cell carcinoma of skin of scalp and neck: Secondary | ICD-10-CM | POA: Diagnosis not present

## 2024-08-05 ENCOUNTER — Encounter: Payer: Self-pay | Admitting: Internal Medicine

## 2024-08-05 ENCOUNTER — Encounter: Payer: Self-pay | Admitting: Cardiology

## 2024-08-10 ENCOUNTER — Encounter

## 2024-08-12 ENCOUNTER — Encounter: Payer: Self-pay | Admitting: Physician Assistant

## 2024-08-12 ENCOUNTER — Encounter: Payer: Self-pay | Admitting: Internal Medicine

## 2024-08-12 ENCOUNTER — Ambulatory Visit

## 2024-08-12 NOTE — Progress Notes (Signed)
° °  ° ° °  Mr. [name] presented to the office as referred by his primary care physician.  He was under the impression that we were performing preventative testing including imaging of the brain in view of his history of coronary artery disease.  We explained to the patient that unless he verbalizes memory changes, it would be no indication to continue the workup which would include imaging, labs and-or neuropsychological exam.  He was instructed to return if there are any memory concerns in the future.  All questions were answered to his satisfaction     Camie Sevin 08/12/2024 8:35 AM

## 2024-08-15 ENCOUNTER — Other Ambulatory Visit: Payer: Self-pay | Admitting: Internal Medicine

## 2024-08-15 DIAGNOSIS — G319 Degenerative disease of nervous system, unspecified: Secondary | ICD-10-CM

## 2024-08-15 DIAGNOSIS — R4789 Other speech disturbances: Secondary | ICD-10-CM

## 2024-09-06 ENCOUNTER — Encounter: Payer: Self-pay | Admitting: Internal Medicine

## 2024-09-07 ENCOUNTER — Encounter: Payer: Self-pay | Admitting: Cardiology

## 2024-09-09 NOTE — Telephone Encounter (Signed)
 This request has been denied and Dr. Joshua has made the patient aware.

## 2024-09-18 ENCOUNTER — Other Ambulatory Visit: Payer: Self-pay | Admitting: Internal Medicine

## 2024-09-18 DIAGNOSIS — N401 Enlarged prostate with lower urinary tract symptoms: Secondary | ICD-10-CM

## 2024-09-18 MED ORDER — DUTASTERIDE 0.5 MG PO CAPS: 0.5000 mg | ORAL_CAPSULE | Freq: Every day | ORAL | 0 refills | Status: AC

## 2024-09-18 MED ORDER — SILODOSIN 8 MG PO CAPS: 8.0000 mg | ORAL_CAPSULE | Freq: Every day | ORAL | 0 refills | Status: AC

## 2024-09-22 NOTE — Telephone Encounter (Signed)
 Please can you advise the PA for the scan ?

## 2024-09-23 ENCOUNTER — Encounter: Payer: Self-pay | Admitting: *Deleted

## 2024-09-23 NOTE — Progress Notes (Signed)
 [name]                                          MRN: [id number]   09/23/2024   The [facility] Quality Team Specialist reviewed this patient medical record for the purposes of chart review for care gap closure. The following were reviewed: chart review for care gap closure-controlling blood pressure.    [facility] Quality Team

## 2024-09-25 NOTE — Telephone Encounter (Signed)
 Patient is now seeking care with a new primary care provider

## 2024-09-30 ENCOUNTER — Other Ambulatory Visit: Payer: Self-pay | Admitting: Internal Medicine

## 2024-10-01 ENCOUNTER — Encounter: Payer: Self-pay | Admitting: Family Medicine

## 2024-10-05 ENCOUNTER — Other Ambulatory Visit: Payer: Self-pay | Admitting: Internal Medicine

## 2024-10-08 ENCOUNTER — Encounter: Payer: Self-pay | Admitting: Family Medicine

## 2024-10-08 NOTE — Telephone Encounter (Signed)
 Has Dr. Wendolyn agreed to see him?

## 2024-10-09 NOTE — Telephone Encounter (Signed)
 Unfortunately, Dr Wendolyn has no availability until May but has been added to our waitlist.

## 2024-10-09 NOTE — Telephone Encounter (Signed)
 LVM to move appt up. Please transfer to [employer] to schedule

## 2024-10-23 ENCOUNTER — Encounter: Admitting: Family Medicine

## 2025-01-11 ENCOUNTER — Ambulatory Visit: Admitting: Cardiology

## 2025-01-19 ENCOUNTER — Encounter: Admitting: Family Medicine

## 2025-02-23 ENCOUNTER — Ambulatory Visit: Admitting: Urology
